# Patient Record
Sex: Female | Born: 1985 | Race: Black or African American | Hispanic: No | Marital: Single | Smoking: Current every day smoker
Health system: Southern US, Community
[De-identification: ages and names within clinical notes are randomized; demographics above are authoritative.]

## PROBLEM LIST (undated history)

## (undated) ENCOUNTER — Inpatient Hospital Stay (HOSPITAL_COMMUNITY): Payer: Self-pay

## (undated) DIAGNOSIS — F4310 Post-traumatic stress disorder, unspecified: Secondary | ICD-10-CM

## (undated) DIAGNOSIS — I10 Essential (primary) hypertension: Secondary | ICD-10-CM

## (undated) DIAGNOSIS — G43909 Migraine, unspecified, not intractable, without status migrainosus: Secondary | ICD-10-CM

## (undated) DIAGNOSIS — F319 Bipolar disorder, unspecified: Secondary | ICD-10-CM

## (undated) DIAGNOSIS — F1410 Cocaine abuse, uncomplicated: Secondary | ICD-10-CM

## (undated) DIAGNOSIS — F419 Anxiety disorder, unspecified: Secondary | ICD-10-CM

## (undated) DIAGNOSIS — N83209 Unspecified ovarian cyst, unspecified side: Secondary | ICD-10-CM

## (undated) DIAGNOSIS — O24419 Gestational diabetes mellitus in pregnancy, unspecified control: Secondary | ICD-10-CM

## (undated) HISTORY — PX: TUBAL LIGATION: SHX77

## (undated) HISTORY — PX: WISDOM TOOTH EXTRACTION: SHX21

---
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
## 2006-06-05 ENCOUNTER — Emergency Department (HOSPITAL_COMMUNITY): Admission: EM | Admit: 2006-06-05 | Discharge: 2006-06-05 | Payer: Self-pay | Admitting: Family Medicine

## 2006-07-25 ENCOUNTER — Emergency Department (HOSPITAL_COMMUNITY): Admission: EM | Admit: 2006-07-25 | Discharge: 2006-07-25 | Payer: Self-pay | Admitting: Family Medicine

## 2007-01-08 ENCOUNTER — Emergency Department (HOSPITAL_COMMUNITY): Admission: EM | Admit: 2007-01-08 | Discharge: 2007-01-08 | Payer: Self-pay | Admitting: Emergency Medicine

## 2007-07-24 ENCOUNTER — Emergency Department (HOSPITAL_COMMUNITY): Admission: EM | Admit: 2007-07-24 | Discharge: 2007-07-24 | Payer: Self-pay | Admitting: Emergency Medicine

## 2007-11-04 ENCOUNTER — Emergency Department (HOSPITAL_COMMUNITY): Admission: EM | Admit: 2007-11-04 | Discharge: 2007-11-04 | Payer: Self-pay | Admitting: Emergency Medicine

## 2008-02-04 ENCOUNTER — Inpatient Hospital Stay (HOSPITAL_COMMUNITY): Admission: RE | Admit: 2008-02-04 | Discharge: 2008-02-04 | Payer: Self-pay | Admitting: Obstetrics & Gynecology

## 2008-07-16 ENCOUNTER — Emergency Department (HOSPITAL_COMMUNITY): Admission: EM | Admit: 2008-07-16 | Discharge: 2008-07-16 | Payer: Self-pay | Admitting: Family Medicine

## 2008-08-12 ENCOUNTER — Emergency Department (HOSPITAL_COMMUNITY): Admission: EM | Admit: 2008-08-12 | Discharge: 2008-08-12 | Payer: Self-pay | Admitting: Emergency Medicine

## 2008-09-24 ENCOUNTER — Emergency Department (HOSPITAL_COMMUNITY): Admission: EM | Admit: 2008-09-24 | Discharge: 2008-09-25 | Payer: Self-pay | Admitting: Family Medicine

## 2008-09-28 ENCOUNTER — Emergency Department (HOSPITAL_COMMUNITY): Admission: EM | Admit: 2008-09-28 | Discharge: 2008-09-28 | Payer: Self-pay | Admitting: Emergency Medicine

## 2008-09-29 ENCOUNTER — Emergency Department (HOSPITAL_COMMUNITY): Admission: EM | Admit: 2008-09-29 | Discharge: 2008-09-30 | Payer: Self-pay | Admitting: Emergency Medicine

## 2008-11-25 ENCOUNTER — Emergency Department (HOSPITAL_COMMUNITY): Admission: EM | Admit: 2008-11-25 | Discharge: 2008-11-25 | Payer: Self-pay | Admitting: Family Medicine

## 2009-01-25 ENCOUNTER — Inpatient Hospital Stay (HOSPITAL_COMMUNITY): Admission: AD | Admit: 2009-01-25 | Discharge: 2009-01-25 | Payer: Self-pay | Admitting: Obstetrics & Gynecology

## 2009-01-30 ENCOUNTER — Emergency Department (HOSPITAL_COMMUNITY): Admission: EM | Admit: 2009-01-30 | Discharge: 2009-01-30 | Payer: Self-pay | Admitting: Emergency Medicine

## 2010-01-26 ENCOUNTER — Emergency Department (HOSPITAL_COMMUNITY): Admission: EM | Admit: 2010-01-26 | Discharge: 2010-01-26 | Payer: Self-pay | Admitting: Emergency Medicine

## 2011-01-18 LAB — CBC
MCV: 93.7 fL (ref 78.0–100.0)
RBC: 4.08 MIL/uL (ref 3.87–5.11)
WBC: 7.6 10*3/uL (ref 4.0–10.5)

## 2011-01-18 LAB — URINALYSIS, ROUTINE W REFLEX MICROSCOPIC
Glucose, UA: NEGATIVE mg/dL
Ketones, ur: NEGATIVE mg/dL
Nitrite: NEGATIVE
Protein, ur: NEGATIVE mg/dL
Urobilinogen, UA: 0.2 mg/dL (ref 0.0–1.0)

## 2011-01-18 LAB — DIFFERENTIAL
Eosinophils Absolute: 0.1 10*3/uL (ref 0.0–0.7)
Lymphs Abs: 1.7 10*3/uL (ref 0.7–4.0)
Monocytes Relative: 7 % (ref 3–12)
Neutrophils Relative %: 68 % (ref 43–77)

## 2011-01-18 LAB — RAPID STREP SCREEN (MED CTR MEBANE ONLY): Streptococcus, Group A Screen (Direct): NEGATIVE

## 2011-01-18 LAB — WET PREP, GENITAL: Trich, Wet Prep: NONE SEEN

## 2011-02-23 ENCOUNTER — Emergency Department (HOSPITAL_COMMUNITY)
Admission: EM | Admit: 2011-02-23 | Discharge: 2011-02-24 | Disposition: A | Discharge status: Home / Self Care | Payer: No Typology Code available for payment source | Attending: Emergency Medicine | Admitting: Emergency Medicine

## 2011-02-23 DIAGNOSIS — M542 Cervicalgia: Secondary | ICD-10-CM | POA: Insufficient documentation

## 2011-02-23 DIAGNOSIS — Y92410 Unspecified street and highway as the place of occurrence of the external cause: Secondary | ICD-10-CM | POA: Insufficient documentation

## 2011-02-23 DIAGNOSIS — M62838 Other muscle spasm: Secondary | ICD-10-CM | POA: Insufficient documentation
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
## 2011-02-24 NOTE — Discharge Summary (Signed)
[facility]  Patient:    Olivia [name], Olivia [name] Visit Number: [id number] MRN: [id number]          Service Type: PSY Location: 100 0104 02 Attending Physician:  Veneta Penton Dictated by:   Veneta Penton, M.D. Admit Date:  03/31/2002 Discharge Date: 04/04/2002                             Discharge Summary  REASON FOR ADMISSION:  This 25 year old African-Americanan female was admitted complaining of depression with suicidal ideation with a plan she refused to discuss.  She was admitted under involuntary commitment.  For further history of present illness, please see the patients psychiatric admission assessment.  PHYSICAL EXAMINATION:  At the time of admission was entirely unremarkable.  LABORATORY EXAMINATION:  The patient underwent a laboratory work-up to rule out any other medical problems contributing to her symptomatology.  A urine probe for gonorrhea and chlamydia were negative.  CBC showed a platelet count of 349,000 and was otherwise unremarkable.  Routine chem panel was within normal limits.  TSH was 0.493.  Free T4 was within normal limits.  A urine pregnancy test was negative.  A urine drug screen was positive for metabolites of marijuana and consistent with the patients history.  A UA showed a positive nitrate, a moderate leukocyte esterase, a few epithelial cells, 7-10 WBCs and 3-6 RBCs with a high amount of hemoglobin consistent with the patient being on her menstrual period.  She denied any urinary tract symptoms.  An RPR was nonreactive.  The patient received no x-rays, no special procedures, no additional consultations.  She sustained no complications during the course of this hospitalization.  HOSPITAL COURSE:  On admission, the patients affect and mood were depressed and anxious.  Her concentration was decreased.  She displayed poor impulse control.  She was begun on a trial of Lexapro once informed consent was obtained and  the risks/benefits discussion was held.  Her affect and mood over the course of this hospitalization has improved and at the time of discharge she denies any homicidal or suicidal ideation.  She is actively participating in all aspects of the therapeutic treatment program, no longer appears to be a danger to herself or others, and is motivated for outpatient therapy, and consequently is felt to have reached her maximum benefits of hospitalization and is ready for discharge to a less restricted alternative setting.  CONDITION ON DISCHARGE:  Improved  FINAL DIAGNOSIS: Axis I:    1. Major depression, single episode, severe, without psychosis.            2. Polysubstance dependence.            3. Conduct disorder.            4. Rule out cocaine-induced depression. Axis II:   1. Rule out learning disorder not otherwise specified.            2. Rule out personality disorder not otherwise specified. Axis III:  None. Axis IV:   Severe. Axis V:    Code 20 on admission, code 30 on discharge.  FURTHER EVALUATION AND TREATMENT RECOMMENDATIONS: 1. The patient is discharged to home. 2. She is discharged on an unrestricted level of activity and a regular diet. 3. She will follow up with [facility] and her    outpatient psychiatrist there for all further aspects of her psychiatric    care and I  will sign off on the case at this time.  She will follow up with    her primary care physician for all further aspects of her medical care.  DISCHARGE MEDICATIONS:  Lexapro 10 mg each day. Dictated by:   Veneta Penton, M.D. Attending Physician:  Veneta Penton DD:  04/04/02 TD:  04/05/02 Job: [id number]

## 2011-02-28 ENCOUNTER — Other Ambulatory Visit (HOSPITAL_COMMUNITY): Payer: Self-pay | Admitting: Orthopaedic Surgery

## 2011-02-28 DIAGNOSIS — M542 Cervicalgia: Secondary | ICD-10-CM

## 2011-03-03 ENCOUNTER — Ambulatory Visit (HOSPITAL_COMMUNITY)
Admission: RE | Admit: 2011-03-03 | Discharge: 2011-03-03 | Disposition: A | Discharge status: Home / Self Care | Payer: No Typology Code available for payment source | Source: Ambulatory Visit | Attending: Orthopaedic Surgery | Admitting: Orthopaedic Surgery

## 2011-03-03 DIAGNOSIS — R209 Unspecified disturbances of skin sensation: Secondary | ICD-10-CM | POA: Insufficient documentation

## 2011-03-03 DIAGNOSIS — M542 Cervicalgia: Secondary | ICD-10-CM | POA: Insufficient documentation

## 2011-06-29 LAB — URINE MICROSCOPIC-ADD ON

## 2011-06-29 LAB — BASIC METABOLIC PANEL
GFR calc non Af Amer: >60
Potassium: 4
Sodium: 136

## 2011-06-29 LAB — URINALYSIS, ROUTINE W REFLEX MICROSCOPIC
Bilirubin Urine: NEGATIVE
Nitrite: NEGATIVE
Specific Gravity, Urine: 1.014
pH: 7

## 2011-06-29 LAB — CBC
HCT: 37.2
Hemoglobin: 12.8
RBC: 4.13
WBC: 6.4

## 2011-06-29 LAB — DIFFERENTIAL
Eosinophils Relative: 1
Lymphocytes Relative: 31
Lymphs Abs: 2
Monocytes Absolute: 0.4

## 2011-06-29 LAB — WET PREP, GENITAL
Trich, Wet Prep: NONE SEEN
Yeast Wet Prep HPF POC: NONE SEEN

## 2011-06-29 LAB — RPR: RPR Ser Ql: NONREACTIVE

## 2011-06-29 LAB — GC/CHLAMYDIA PROBE AMP, GENITAL: Chlamydia, DNA Probe: NEGATIVE

## 2011-07-04 LAB — URINALYSIS, ROUTINE W REFLEX MICROSCOPIC
Bilirubin Urine: NEGATIVE
Glucose, UA: NEGATIVE
Hgb urine dipstick: NEGATIVE
Ketones, ur: NEGATIVE
Nitrite: NEGATIVE
Protein, ur: NEGATIVE
Specific Gravity, Urine: <1.005 — ABNORMAL LOW
Urobilinogen, UA: 0.2
pH: 5.5

## 2011-07-04 LAB — GC/CHLAMYDIA PROBE AMP, GENITAL: Chlamydia, DNA Probe: NEGATIVE

## 2011-07-04 LAB — POCT PREGNANCY, URINE
Operator id: 120561
Preg Test, Ur: NEGATIVE

## 2011-07-04 LAB — WET PREP, GENITAL
Clue Cells Wet Prep HPF POC: NONE SEEN
Trich, Wet Prep: NONE SEEN
Yeast Wet Prep HPF POC: NONE SEEN

## 2011-07-19 LAB — POCT PREGNANCY, URINE
Operator id: 239701
Preg Test, Ur: NEGATIVE

## 2011-07-19 LAB — POCT URINALYSIS DIP (DEVICE)
Glucose, UA: NEGATIVE
Nitrite: NEGATIVE
Operator id: 239701
Protein, ur: 30 — AB
Specific Gravity, Urine: 1.02
Urobilinogen, UA: 0.2

## 2011-07-23 ENCOUNTER — Emergency Department (HOSPITAL_COMMUNITY)
Admission: EM | Admit: 2011-07-23 | Discharge: 2011-07-23 | Disposition: A | Discharge status: Home / Self Care | Payer: No Typology Code available for payment source | Attending: Emergency Medicine | Admitting: Emergency Medicine

## 2011-07-23 DIAGNOSIS — Y92410 Unspecified street and highway as the place of occurrence of the external cause: Secondary | ICD-10-CM | POA: Insufficient documentation

## 2011-07-23 DIAGNOSIS — M542 Cervicalgia: Secondary | ICD-10-CM | POA: Insufficient documentation

## 2011-07-23 DIAGNOSIS — T1490XA Injury, unspecified, initial encounter: Secondary | ICD-10-CM | POA: Insufficient documentation

## 2011-07-23 DIAGNOSIS — M25519 Pain in unspecified shoulder: Secondary | ICD-10-CM | POA: Insufficient documentation

## 2011-07-29 ENCOUNTER — Inpatient Hospital Stay (INDEPENDENT_AMBULATORY_CARE_PROVIDER_SITE_OTHER)
Admission: RE | Admit: 2011-07-29 | Discharge: 2011-07-29 | Disposition: A | Discharge status: Home / Self Care | Payer: Self-pay | Source: Ambulatory Visit | Attending: Emergency Medicine | Admitting: Emergency Medicine

## 2011-07-29 ENCOUNTER — Ambulatory Visit (INDEPENDENT_AMBULATORY_CARE_PROVIDER_SITE_OTHER): Payer: No Typology Code available for payment source

## 2011-07-29 DIAGNOSIS — IMO0002 Reserved for concepts with insufficient information to code with codable children: Secondary | ICD-10-CM

## 2011-07-29 DIAGNOSIS — S139XXA Sprain of joints and ligaments of unspecified parts of neck, initial encounter: Secondary | ICD-10-CM

## 2011-10-26 ENCOUNTER — Emergency Department (INDEPENDENT_AMBULATORY_CARE_PROVIDER_SITE_OTHER)
Admission: EM | Admit: 2011-10-26 | Discharge: 2011-10-26 | Disposition: A | Discharge status: Home / Self Care | Payer: Self-pay | Source: Home / Self Care | Attending: Family Medicine | Admitting: Family Medicine

## 2011-10-26 ENCOUNTER — Encounter (HOSPITAL_COMMUNITY): Payer: Self-pay | Admitting: Emergency Medicine

## 2011-10-26 DIAGNOSIS — T148XXA Other injury of unspecified body region, initial encounter: Secondary | ICD-10-CM

## 2011-10-26 DIAGNOSIS — R51 Headache: Secondary | ICD-10-CM

## 2011-10-26 MED ORDER — CYCLOBENZAPRINE HCL 10 MG PO TABS: 10.0000 mg | ORAL_TABLET | Freq: Two times a day (BID) | ORAL | Status: AC | PRN

## 2011-10-26 MED ORDER — ACETAMINOPHEN-CODEINE #3 300-30 MG PO TABS: 1.0000 | ORAL_TABLET | Freq: Four times a day (QID) | ORAL | Status: AC | PRN
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
## 2012-04-13 ENCOUNTER — Emergency Department (INDEPENDENT_AMBULATORY_CARE_PROVIDER_SITE_OTHER)
Admission: EM | Admit: 2012-04-13 | Discharge: 2012-04-13 | Disposition: A | Discharge status: Home / Self Care | Payer: Self-pay | Source: Home / Self Care | Attending: Family Medicine | Admitting: Family Medicine

## 2012-04-13 ENCOUNTER — Encounter (HOSPITAL_COMMUNITY): Payer: Self-pay | Admitting: *Deleted

## 2012-04-13 DIAGNOSIS — G500 Trigeminal neuralgia: Secondary | ICD-10-CM

## 2012-04-13 HISTORY — DX: Migraine, unspecified, not intractable, without status migrainosus: G43.909

## 2012-04-13 MED ORDER — NEOMYCIN-POLYMYXIN-HC 3.5-10000-1 OT SUSP: 4.0000 [drp] | Freq: Three times a day (TID) | OTIC | Status: AC

## 2012-04-13 MED ORDER — GABAPENTIN 300 MG PO CAPS: 300.0000 mg | ORAL_CAPSULE | Freq: Three times a day (TID) | ORAL | Status: DC

## 2012-04-13 NOTE — ED Provider Notes (Signed)
History     CSN: [id number]  Arrival date & time 04/13/12  1102   First MD Initiated Contact with Patient 04/13/12 1103      Chief Complaint  Patient presents with  . Otalgia    (Consider location/radiation/quality/duration/timing/severity/associated sxs/prior treatment) Patient is a 26 y.o. female presenting with ear pain. The history is provided by the patient.  Otalgia This is a new problem. The current episode started more than 1 week ago (2 weeks of continuous sx.). There is pain in the left ear. The problem has not changed since onset.There has been no fever. The pain is mild. Associated symptoms include headaches. Pertinent negatives include no ear discharge, no hearing loss, no rhinorrhea and no sore throat. Her past medical history does not include chronic ear infection.    Past Medical History  Diagnosis Date  . Migraine     History reviewed. No pertinent past surgical history.  History reviewed. No pertinent family history.  History  Substance Use Topics  . Smoking status: Current Everyday Smoker  . Smokeless tobacco: Not on file  . Alcohol Use: No    OB History    Grav Para Term Preterm Abortions TAB SAB Ect Mult Living                  Review of Systems  Constitutional: Negative.   HENT: Positive for ear pain. Negative for hearing loss, sore throat, rhinorrhea and ear discharge.   Eyes: Negative.   Neurological: Positive for headaches.    Allergies  Review of patient's allergies indicates no known allergies.  Home Medications   Current Outpatient Rx  Name Route Sig Dispense Refill  . IBUPROFEN 400 MG PO TABS Oral Take 400 mg by mouth every 6 (six) hours as needed.    Marland Kitchen GABAPENTIN 300 MG PO CAPS Oral Take 1 capsule (300 mg total) by mouth 3 (three) times daily. 30 capsule 1  . NEOMYCIN-POLYMYXIN-HC 3.5-10000-1 OT SUSP Left Ear Place 4 drops into the left ear 3 (three) times daily. 10 mL 0    BP 120/82  Pulse 66  Temp 99.5 F (37.5 C) (Oral)   Resp 16  SpO2 100%  LMP 03/29/2012  Physical Exam  Nursing note and vitals reviewed. Constitutional: She is oriented to person, place, and time. She appears well-developed and well-nourished.  HENT:  Head: Atraumatic.  Right Ear: External ear normal.  Left Ear: External ear normal.  Nose: Nose normal.  Mouth/Throat: Oropharynx is clear and moist.  Eyes: Conjunctivae are normal. Pupils are equal, round, and reactive to light.  Neck: Normal range of motion. Neck supple.  Lymphadenopathy:    She has no cervical adenopathy.  Neurological: She is alert and oriented to person, place, and time.       Left facial preauricular tenderness with facial n distribution paresthesias. No apparent weakness.,nl speech.  Skin: Skin is warm and dry.    ED Course  Procedures (including critical care time)  Labs Reviewed - No data to display No results found.   1. Trigeminal neuralgia pain       MDM          Linna Hoff, MD 04/13/12 1134

## 2012-04-13 NOTE — ED Notes (Signed)
Pt c/o left ear pain/radiating to left side of face onset x 2 weeks

## 2012-04-18 ENCOUNTER — Telehealth (HOSPITAL_COMMUNITY): Payer: Self-pay | Admitting: *Deleted

## 2012-04-18 NOTE — ED Notes (Signed)
Pt. called on VM  on nurse line at the clinical desk.  I called back and she said the pharmacy told her she did not have a valid Rx.  I told her I did not understand why the pharmacy did not call us.  I asked her if she had any purple papers with her d/c instructions. She said no. I told her I would ask the doctor and then call them in for her. Discussed with Dr. Alfonse Ras- Coll and she gave permission for me to call in the Gabapentin and Neomycin- Polymyxin- Hydrocortisone ear drops.  Rx.'s called the pharmacist @ [phone]. Vassie Moselle 04/18/2012

## 2012-07-11 ENCOUNTER — Inpatient Hospital Stay (HOSPITAL_COMMUNITY)
Admission: AD | Admit: 2012-07-11 | Discharge: 2012-07-11 | Disposition: A | Discharge status: Home / Self Care | Payer: Self-pay | Source: Ambulatory Visit | Attending: Obstetrics & Gynecology | Admitting: Obstetrics & Gynecology

## 2012-07-11 ENCOUNTER — Encounter (HOSPITAL_COMMUNITY): Payer: Self-pay | Admitting: *Deleted

## 2012-07-11 ENCOUNTER — Inpatient Hospital Stay (HOSPITAL_COMMUNITY): Payer: Self-pay

## 2012-07-11 DIAGNOSIS — N946 Dysmenorrhea, unspecified: Secondary | ICD-10-CM

## 2012-07-11 DIAGNOSIS — R51 Headache: Secondary | ICD-10-CM | POA: Insufficient documentation

## 2012-07-11 DIAGNOSIS — M549 Dorsalgia, unspecified: Secondary | ICD-10-CM | POA: Insufficient documentation

## 2012-07-11 DIAGNOSIS — N938 Other specified abnormal uterine and vaginal bleeding: Secondary | ICD-10-CM | POA: Insufficient documentation

## 2012-07-11 DIAGNOSIS — B9689 Other specified bacterial agents as the cause of diseases classified elsewhere: Secondary | ICD-10-CM | POA: Insufficient documentation

## 2012-07-11 DIAGNOSIS — N940 Mittelschmerz: Secondary | ICD-10-CM

## 2012-07-11 DIAGNOSIS — N949 Unspecified condition associated with female genital organs and menstrual cycle: Secondary | ICD-10-CM | POA: Insufficient documentation

## 2012-07-11 DIAGNOSIS — A499 Bacterial infection, unspecified: Secondary | ICD-10-CM | POA: Insufficient documentation

## 2012-07-11 DIAGNOSIS — R1032 Left lower quadrant pain: Secondary | ICD-10-CM | POA: Insufficient documentation

## 2012-07-11 DIAGNOSIS — N760 Acute vaginitis: Secondary | ICD-10-CM | POA: Insufficient documentation

## 2012-07-11 LAB — URINALYSIS, ROUTINE W REFLEX MICROSCOPIC
Glucose, UA: NEGATIVE mg/dL
Ketones, ur: NEGATIVE mg/dL
Leukocytes, UA: NEGATIVE
Protein, ur: NEGATIVE mg/dL
Urobilinogen, UA: 0.2 mg/dL (ref 0.0–1.0)

## 2012-07-11 LAB — POCT PREGNANCY, URINE: Preg Test, Ur: NEGATIVE

## 2012-07-11 LAB — WET PREP, GENITAL: Trich, Wet Prep: NONE SEEN

## 2012-07-11 LAB — URINE MICROSCOPIC-ADD ON

## 2012-07-11 MED ORDER — IBUPROFEN 800 MG PO TABS: 800.0000 mg | ORAL_TABLET | Freq: Three times a day (TID) | ORAL | Status: DC | PRN

## 2012-07-11 MED ORDER — KETOROLAC TROMETHAMINE 60 MG/2ML IM SOLN
60.0000 mg | Freq: Once | INTRAMUSCULAR | Status: AC
  Administered 2012-07-11: 60 mg via INTRAMUSCULAR
  Filled 2012-07-11: qty 2

## 2012-07-11 MED ORDER — METRONIDAZOLE 500 MG PO TABS: 500.0000 mg | ORAL_TABLET | Freq: Two times a day (BID) | ORAL | Status: DC

## 2012-07-11 NOTE — MAU Note (Signed)
Patient states she had been having left abdominal pain for 4 days and headaches for 3 days. Bloody vaginal discharge with itching.

## 2012-07-11 NOTE — MAU Provider Note (Signed)
History     CSN: [id number]  Arrival date and time: 07/11/12 1203   First Provider Initiated Contact with Patient 07/11/12 1432      Chief Complaint  Patient presents with  . Headache  . Abdominal Pain   HPI Pt is a 26 yo female who reports headache, back pain, vaginal spotting, vaginal itching and abdominal pain this week.  Clear vaginal discharge with brown specks starting 3 days ago. Headache and low back pain started 2 days ago. Yesterday she started having light vaginal spotting and itching as well as LLQ abdominal pain that is achy/crampy in nature.  Lying on her back or pushing hard on her left side makes pain worse. She took ibuprofen but pain still present. Her LMP was 9/7 and lasted 3 days. She has regular periods that are usually 3-4 days, no heavy bleeding. She uses condoms for birth control and has not had any STDs. She had a normal pap smear in August. History or ovarian cysts 5 years ago.  Headache is frontal, constant. No photophobia or vision changes. Does have nausea, no vomitng. No fever, chills, diarrhea or constipation.  She goes to [facility] on [address] for care.   Past Medical History  Diagnosis Date  . Migraine     Past Surgical History  Procedure Date  . Wisdom tooth extraction     History reviewed. No pertinent family history.  History  Substance Use Topics  . Smoking status: Current Every Day Smoker    Types: Cigarettes  . Smokeless tobacco: Not on file  . Alcohol Use: No  Smokes 3 packs per week.   Allergies: No Known Allergies  Prescriptions prior to admission  Medication Sig Dispense Refill  . gabapentin (NEURONTIN) 300 MG capsule Take 1 capsule (300 mg total) by mouth 3 (three) times daily.  30 capsule  1  . ibuprofen (ADVIL,MOTRIN) 400 MG tablet Take 400 mg by mouth every 6 (six) hours as needed.       Not taking gabapentin  Review of Systems  Constitutional: Negative for fever and chills.  HENT: Negative for hearing loss and  tinnitus.   Eyes: Positive for blurred vision. Negative for double vision and photophobia.  Respiratory: Negative for shortness of breath.   Cardiovascular: Negative for chest pain and palpitations.  Gastrointestinal: Positive for nausea and abdominal pain. Negative for heartburn, vomiting, diarrhea, constipation, blood in stool and melena.  Genitourinary: Negative for dysuria, urgency and frequency.  Musculoskeletal: Positive for back pain.  Skin: Negative for rash.  Neurological: Positive for headaches. Negative for dizziness.   Physical Exam   Blood pressure 121/75, pulse 79, temperature 98.1 F (36.7 C), temperature source Oral, resp. rate 16, height 5' 1.5" (1.562 m), weight 67.223 kg (148 lb 3.2 oz), last menstrual period 07/05/2012, SpO2 100.00%.  Physical Exam  Constitutional: She appears well-developed and well-nourished. No distress.  HENT:  Head: Normocephalic and atraumatic.  Eyes: Conjunctivae normal and EOM are normal.  Neck: Normal range of motion. Neck supple.  Cardiovascular: Normal rate, regular rhythm and normal heart sounds.   Respiratory: Effort normal and breath sounds normal. No respiratory distress.  GI: Soft. Bowel sounds are normal. She exhibits no distension. There is tenderness (mild LLQ). There is no rebound and no guarding.  Genitourinary:       Normal vagina, moderate white discharge. No odor. Uterus normal size. Cervix deviated slightly to left, nulliparous. No CMT. Mild left adnexal tenderness, no masses.   Musculoskeletal: Normal range of motion.  She exhibits no edema and no tenderness.  Neurological: She is alert.  Skin: Skin is warm and dry.  Psychiatric: She has a normal mood and affect.    MAU Course  Procedures   Results for orders placed during the hospital encounter of 07/11/12 (from the past 24 hour(s))  URINALYSIS, ROUTINE W REFLEX MICROSCOPIC     Status: Abnormal   Collection Time   07/11/12 12:40 PM      Component Value Range    Color, Urine YELLOW  YELLOW   APPearance CLEAR  CLEAR   Specific Gravity, Urine 1.010  1.005 - 1.030   pH 6.5  5.0 - 8.0   Glucose, UA NEGATIVE  NEGATIVE mg/dL   Hgb urine dipstick MODERATE (*) NEGATIVE   Bilirubin Urine NEGATIVE  NEGATIVE   Ketones, ur NEGATIVE  NEGATIVE mg/dL   Protein, ur NEGATIVE  NEGATIVE mg/dL   Urobilinogen, UA 0.2  0.0 - 1.0 mg/dL   Nitrite NEGATIVE  NEGATIVE   Leukocytes, UA NEGATIVE  NEGATIVE  URINE MICROSCOPIC-ADD ON     Status: Normal   Collection Time   07/11/12 12:40 PM      Component Value Range   Squamous Epithelial / LPF RARE  RARE   WBC, UA 0-2  <3 WBC/hpf   RBC / HPF 0-2  <3 RBC/hpf   Bacteria, UA RARE  RARE  POCT PREGNANCY, URINE     Status: Normal   Collection Time   07/11/12 12:46 PM      Component Value Range   Preg Test, Ur NEGATIVE  NEGATIVE  WET PREP, GENITAL     Status: Abnormal   Collection Time   07/11/12  2:50 PM      Component Value Range   Yeast Wet Prep HPF POC NONE SEEN  NONE SEEN   Trich, Wet Prep NONE SEEN  NONE SEEN   Clue Cells Wet Prep HPF POC FEW (*) NONE SEEN   WBC, Wet Prep HPF POC FEW (*) NONE SEEN   Pelvic Sono: Unremarkable. Small amt of free fluid in cul de sac and R adnexa.   Assessment and Plan  26 y.o. G0P0 with  1. Left-sided pelvic pain - no cyst. Likely onset of menses.  Motrin for pain. 2.  Bacterial vaginosis - treat with flagyl 3.  Vaginal bleeding. Likely onset of menses. Pt advised to monitor cycles and follow up with her ob/gyn as needed. UPT negative.    Napoleon Form 07/11/2012, 2:33 PM

## 2014-05-16 ENCOUNTER — Encounter (HOSPITAL_COMMUNITY): Payer: Self-pay | Admitting: Emergency Medicine

## 2014-05-16 ENCOUNTER — Emergency Department (HOSPITAL_COMMUNITY)
Admission: EM | Admit: 2014-05-16 | Discharge: 2014-05-16 | Disposition: A | Discharge status: Home / Self Care | Payer: Worker's Compensation | Attending: Emergency Medicine | Admitting: Emergency Medicine

## 2014-05-16 DIAGNOSIS — T2220XA Burn of second degree of shoulder and upper limb, except wrist and hand, unspecified site, initial encounter: Secondary | ICD-10-CM | POA: Diagnosis present

## 2014-05-16 DIAGNOSIS — X12XXXA Contact with other hot fluids, initial encounter: Secondary | ICD-10-CM | POA: Insufficient documentation

## 2014-05-16 DIAGNOSIS — F17200 Nicotine dependence, unspecified, uncomplicated: Secondary | ICD-10-CM | POA: Insufficient documentation

## 2014-05-16 DIAGNOSIS — X131XXA Other contact with steam and other hot vapors, initial encounter: Secondary | ICD-10-CM

## 2014-05-16 DIAGNOSIS — Y9289 Other specified places as the place of occurrence of the external cause: Secondary | ICD-10-CM | POA: Diagnosis not present

## 2014-05-16 DIAGNOSIS — Y9389 Activity, other specified: Secondary | ICD-10-CM | POA: Insufficient documentation

## 2014-05-16 DIAGNOSIS — Z8669 Personal history of other diseases of the nervous system and sense organs: Secondary | ICD-10-CM | POA: Insufficient documentation

## 2014-05-16 DIAGNOSIS — Z792 Long term (current) use of antibiotics: Secondary | ICD-10-CM | POA: Diagnosis not present

## 2014-05-16 MED ORDER — SILVER SULFADIAZINE 1 % EX CREA
TOPICAL_CREAM | Freq: Once | CUTANEOUS | Status: AC
  Administered 2014-05-16: 12:00:00 via TOPICAL
  Filled 2014-05-16: qty 50

## 2014-05-16 MED ORDER — HYDROCODONE-ACETAMINOPHEN 5-325 MG PO TABS
2.0000 | ORAL_TABLET | Freq: Once | ORAL | Status: AC
  Administered 2014-05-16: 2 via ORAL
  Filled 2014-05-16: qty 2

## 2014-05-16 NOTE — ED Provider Notes (Signed)
Medical screening examination/treatment/procedure(s) were performed by non-physician practitioner and as supervising physician I was immediately available for consultation/collaboration.   EKG Interpretation None       Raeford RazorStephen Casper Pagliuca, MD 05/16/14 1504
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
## 2014-05-16 NOTE — Discharge Instructions (Signed)
Continue using silvadene cream and dressing changes twice daily.  Tylenol or motrin as needed for pain. Monitor for signs of infection-- redness, swelling, purulent drainage, high fever, chills, etc. Return to the ED for new or worsening symptoms.
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
## 2014-08-27 ENCOUNTER — Emergency Department (HOSPITAL_COMMUNITY)
Admission: EM | Admit: 2014-08-27 | Discharge: 2014-08-27 | Disposition: A | Discharge status: Home / Self Care | Payer: Self-pay | Attending: Emergency Medicine | Admitting: Emergency Medicine

## 2014-08-27 ENCOUNTER — Encounter (HOSPITAL_COMMUNITY): Payer: Self-pay | Admitting: Emergency Medicine

## 2014-08-27 ENCOUNTER — Emergency Department (HOSPITAL_COMMUNITY): Payer: Self-pay

## 2014-08-27 DIAGNOSIS — Z8679 Personal history of other diseases of the circulatory system: Secondary | ICD-10-CM | POA: Insufficient documentation

## 2014-08-27 DIAGNOSIS — N83201 Unspecified ovarian cyst, right side: Secondary | ICD-10-CM

## 2014-08-27 DIAGNOSIS — N8320 Unspecified ovarian cysts: Secondary | ICD-10-CM | POA: Insufficient documentation

## 2014-08-27 DIAGNOSIS — Z720 Tobacco use: Secondary | ICD-10-CM | POA: Insufficient documentation

## 2014-08-27 DIAGNOSIS — Z3202 Encounter for pregnancy test, result negative: Secondary | ICD-10-CM | POA: Insufficient documentation

## 2014-08-27 DIAGNOSIS — R102 Pelvic and perineal pain: Secondary | ICD-10-CM

## 2014-08-27 DIAGNOSIS — N938 Other specified abnormal uterine and vaginal bleeding: Secondary | ICD-10-CM | POA: Insufficient documentation

## 2014-08-27 LAB — CBC WITH DIFFERENTIAL/PLATELET
BASOS ABS: 0.1 10*3/uL (ref 0.0–0.1)
BASOS PCT: 1 % (ref 0–1)
Eosinophils Absolute: 0.1 10*3/uL (ref 0.0–0.7)
Eosinophils Relative: 2 % (ref 0–5)
HEMATOCRIT: 39.3 % (ref 36.0–46.0)
Hemoglobin: 13.2 g/dL (ref 12.0–15.0)
LYMPHS PCT: 38 % (ref 12–46)
Lymphs Abs: 2.4 10*3/uL (ref 0.7–4.0)
MCH: 30 pg (ref 26.0–34.0)
MCHC: 33.6 g/dL (ref 30.0–36.0)
MCV: 89.3 fL (ref 78.0–100.0)
Monocytes Absolute: 0.4 10*3/uL (ref 0.1–1.0)
Monocytes Relative: 7 % (ref 3–12)
NEUTROS ABS: 3.3 10*3/uL (ref 1.7–7.7)
NEUTROS PCT: 52 % (ref 43–77)
PLATELETS: 353 10*3/uL (ref 150–400)
RBC: 4.4 MIL/uL (ref 3.87–5.11)
RDW: 12.3 % (ref 11.5–15.5)
WBC: 6.3 10*3/uL (ref 4.0–10.5)

## 2014-08-27 LAB — URINALYSIS, ROUTINE W REFLEX MICROSCOPIC
Glucose, UA: NEGATIVE mg/dL
Ketones, ur: NEGATIVE mg/dL
Nitrite: NEGATIVE
PROTEIN: 30 mg/dL — AB
SPECIFIC GRAVITY, URINE: 1.03 (ref 1.005–1.030)
UROBILINOGEN UA: 1 mg/dL (ref 0.0–1.0)
pH: 6 (ref 5.0–8.0)

## 2014-08-27 LAB — POC URINE PREG, ED: PREG TEST UR: NEGATIVE

## 2014-08-27 LAB — URINE MICROSCOPIC-ADD ON

## 2014-08-27 MED ORDER — NAPROXEN 500 MG PO TABS: 500.0000 mg | ORAL_TABLET | Freq: Two times a day (BID) | ORAL | Status: DC

## 2014-08-27 NOTE — ED Notes (Addendum)
Pt co vaginal bleeding since Friday. Last period was on 08/09/14. HX of ovarian cysts. Pt co abdominal pain, weakness  and heavy vaginal bleeding today. 2 pads/ hour.

## 2014-08-27 NOTE — Progress Notes (Signed)
[facility] Stacy,   Provided pt with a list of primary care resources, highlighting [facility]. Also, Provided pt with a [facility] application to help patient establish primary care.

## 2014-08-27 NOTE — ED Provider Notes (Signed)
CSN: [id number]     Arrival date & time 08/27/14  0932 History   First MD Initiated Contact with Patient 08/27/14 [phone]     Chief Complaint  Patient presents with  . Vaginal Bleeding     (Consider location/radiation/quality/duration/timing/severity/associated sxs/prior Treatment) HPI Comments: Pt had normal menses on the first of the month that lasted 5 days and about 1 week later she started to spot and then started to have heavy bleeding in the last 2-3 days.  Patient is a 28 y.o. female presenting with vaginal bleeding. The history is provided by the patient.  Vaginal Bleeding Quality:  Dark red and heavier than menses Severity:  Moderate Onset quality:  Gradual Duration:  3 days Timing:  Constant Progression:  Worsening Chronicity:  New Menstrual history:  Regular Number of pads used:  2/hr Possible pregnancy: no   Context: spontaneously   Relieved by:  None tried Worsened by:  Nothing tried Ineffective treatments:  None tried Associated symptoms: abdominal pain and fatigue   Associated symptoms: no back pain, no dizziness, no dysuria, no fever, no nausea and no vaginal discharge   Abdominal pain:    Pain location: right pelvis.   Quality:  Cramping and sharp   Severity:  Moderate   Onset quality:  Gradual   Duration:  3 days   Timing:  Constant   Progression:  Worsening   Chronicity:  Recurrent Risk factors: ovarian cysts and unprotected sex   Risk factors: does not have multiple partners, no new sexual partner, no STD and no STD exposure     Past Medical History  Diagnosis Date  . Migraine    Past Surgical History  Procedure Laterality Date  . Wisdom tooth extraction     Family History  Problem Relation Age of Onset  . Hypertension Mother   . Hypertension Father   . Migraines Mother   . Cancer Father     prostate   History  Substance Use Topics  . Smoking status: Current Every Day Smoker    Types: Cigarettes  . Smokeless tobacco: Not on file  .  Alcohol Use: No   OB History    Gravida Para Term Preterm AB TAB SAB Ectopic Multiple Living   0              Review of Systems  Constitutional: Positive for fatigue. Negative for fever.  Gastrointestinal: Positive for abdominal pain. Negative for nausea.  Genitourinary: Positive for vaginal bleeding. Negative for dysuria and vaginal discharge.  Musculoskeletal: Negative for back pain.  Neurological: Negative for dizziness.  All other systems reviewed and are negative.     Allergies  Review of patient's allergies indicates no known allergies.  Home Medications   Prior to Admission medications   Medication Sig Start Date End Date Taking? Authorizing Provider  naproxen sodium (ANAPROX) 220 MG tablet Take 440 mg by mouth 2 (two) times daily as needed (pain).    Yes Historical Provider, MD   BP 123/83 mmHg  Pulse 80  Temp(Src) 98.2 F (36.8 C) (Oral)  Resp 16  SpO2 100%  LMP 08/09/2014 Physical Exam  Constitutional: She is oriented to person, place, and time. She appears well-developed and well-nourished. No distress.  HENT:  Head: Normocephalic and atraumatic.  Mouth/Throat: Oropharynx is clear and moist.  Eyes: Conjunctivae and EOM are normal. Pupils are equal, round, and reactive to light.  Neck: Normal range of motion. Neck supple.  Cardiovascular: Normal rate, regular rhythm and intact distal pulses.  Abdominal: Soft. She exhibits no distension. There is tenderness. There is no rebound and no guarding.  Mild right sided pelvic tenderness  Genitourinary: Uterus normal. Uterus is not tender. Cervix exhibits no motion tenderness, no discharge and no friability. Right adnexum displays tenderness. Right adnexum displays no mass and no fullness. Left adnexum displays no mass, no tenderness and no fullness. There is bleeding in the vagina.  Musculoskeletal: Normal range of motion. She exhibits no edema or tenderness.  Neurological: She is alert and oriented to person, place,  and time.  Skin: Skin is warm and dry. No rash noted. No erythema.  Psychiatric: She has a normal mood and affect. Her behavior is normal.  Nursing note and vitals reviewed.   ED Course  Procedures (including critical care time) Labs Review Labs Reviewed  GC/CHLAMYDIA PROBE AMP  CBC WITH DIFFERENTIAL  URINALYSIS, ROUTINE W REFLEX MICROSCOPIC  POC URINE PREG, ED    Imaging Review [place] Transvaginal Non-ob  08/27/2014   CLINICAL DATA:  Abdominal pain adnexal pain and pelvic pain. Abnormal mentral period two week weeks prior.  EXAM: TRANSABDOMINAL AND TRANSVAGINAL ULTRASOUND OF PELVIS  TECHNIQUE: Both transabdominal and transvaginal ultrasound examinations of the pelvis were performed. Transabdominal technique was performed for global imaging of the pelvis including uterus, ovaries, adnexal regions, and pelvic cul-de-sac. It was necessary to proceed with endovaginal exam following the transabdominal exam to visualize the endometrium and ovaries.  COMPARISON:  Ultrasound 07/11/2012  FINDINGS: Uterus  Measurements: Normal in size and homogeneous in echotexture measuring 7.7 x 3.1 x 4.8 cm. Several nabothian cysts in lower uterine segment.  Endometrium  Thickness: Normal in thickness for premenopausal female at 10 mm. No nodularity.  Right ovary  Measurements: Normal in size at 3.9 x 2.5 x 2.6 cm. There is a rounded complex cysts within the right ovary measuring 2.5 cm with a peripheral nodularity but no significant blood flow. Nodules measure up to 7 mm in there are thickened septations.  Left ovary  Measurements: Normal in size at 3.6 x 1.6 x 3.7 cm. Smaller complex cysts within the left ovary measures 2.1 x 1.4 x 1 per 8 cm and most consistent with a small hemorrhagic cyst or corpus luteal cyst.  Other findings  Small amount free fluid.  IMPRESSION: 1. Complex cysts in the right ovary with mural nodularity without blood flow. While this may represent a complex hemorrhagic cyst, cannot exclude a ovarian  neoplasm. Recommend followup pelvic ultrasound in 6 to 12 weeks and if findings persist recommend GYN consultation. 2. Benign-appearing right hemorrhagic or corpus luteal cyst of the left ovary. 3. Uterus and endometrium appear normal. Nabothian cysts in the lower uterine segment.   Electronically Signed   By: Genevive BiStewart  Edmunds M.D.   On: 08/27/2014 12:07   [place] Pelvis Complete  08/27/2014   CLINICAL DATA:  Abdominal pain adnexal pain and pelvic pain. Abnormal mentral period two week weeks prior.  EXAM: TRANSABDOMINAL AND TRANSVAGINAL ULTRASOUND OF PELVIS  TECHNIQUE: Both transabdominal and transvaginal ultrasound examinations of the pelvis were performed. Transabdominal technique was performed for global imaging of the pelvis including uterus, ovaries, adnexal regions, and pelvic cul-de-sac. It was necessary to proceed with endovaginal exam following the transabdominal exam to visualize the endometrium and ovaries.  COMPARISON:  Ultrasound 07/11/2012  FINDINGS: Uterus  Measurements: Normal in size and homogeneous in echotexture measuring 7.7 x 3.1 x 4.8 cm. Several nabothian cysts in lower uterine segment.  Endometrium  Thickness: Normal in thickness for premenopausal female at 1910  mm. No nodularity.  Right ovary  Measurements: Normal in size at 3.9 x 2.5 x 2.6 cm. There is a rounded complex cysts within the right ovary measuring 2.5 cm with a peripheral nodularity but no significant blood flow. Nodules measure up to 7 mm in there are thickened septations.  Left ovary  Measurements: Normal in size at 3.6 x 1.6 x 3.7 cm. Smaller complex cysts within the left ovary measures 2.1 x 1.4 x 1 per 8 cm and most consistent with a small hemorrhagic cyst or corpus luteal cyst.  Other findings  Small amount free fluid.  IMPRESSION: 1. Complex cysts in the right ovary with mural nodularity without blood flow. While this may represent a complex hemorrhagic cyst, cannot exclude a ovarian neoplasm. Recommend followup pelvic  ultrasound in 6 to 12 weeks and if findings persist recommend GYN consultation. 2. Benign-appearing right hemorrhagic or corpus luteal cyst of the left ovary. 3. Uterus and endometrium appear normal. Nabothian cysts in the lower uterine segment.   Electronically Signed   By: Genevive BiStewart  Edmunds M.D.   On: 08/27/2014 12:07     EKG Interpretation None      MDM   Final diagnoses:  Adnexal pain  Cyst of right ovary  DUB (dysfunctional uterine bleeding)    Patient here with symptoms consistent with dysfunctional uterine bleeding but also having significant right adnexal tenderness. Denies any history concerning for STD but GC and Chlamydia swab was done. Transvaginal ultrasound pending for further evaluation of the right adnexa. Patient does have a prior history of ovarian cyst. Low suspicion for torsion. UPT negative. Hemoglobin normal at 13.  12:36 PM Ultrasound showed a complex ovarian cyst on the right side but they recommended follow-up in 6-12 weeks. These findings were communicated patient will follow-up with women's   Gwyneth SproutWhitney Ayelet Gruenewald, MD 08/27/14 1236

## 2014-08-28 LAB — GC/CHLAMYDIA PROBE AMP
CT PROBE, AMP APTIMA: NEGATIVE
GC PROBE AMP APTIMA: NEGATIVE

## 2014-10-11 ENCOUNTER — Emergency Department (HOSPITAL_COMMUNITY)
Admission: EM | Admit: 2014-10-11 | Discharge: 2014-10-11 | Disposition: A | Discharge status: Home / Self Care | Payer: Self-pay | Attending: Emergency Medicine | Admitting: Emergency Medicine

## 2014-10-11 ENCOUNTER — Encounter (HOSPITAL_COMMUNITY): Payer: Self-pay | Admitting: Emergency Medicine

## 2014-10-11 DIAGNOSIS — Z8679 Personal history of other diseases of the circulatory system: Secondary | ICD-10-CM | POA: Insufficient documentation

## 2014-10-11 DIAGNOSIS — Z720 Tobacco use: Secondary | ICD-10-CM | POA: Insufficient documentation

## 2014-10-11 DIAGNOSIS — J3489 Other specified disorders of nose and nasal sinuses: Secondary | ICD-10-CM | POA: Insufficient documentation

## 2014-10-11 DIAGNOSIS — J0100 Acute maxillary sinusitis, unspecified: Secondary | ICD-10-CM | POA: Insufficient documentation

## 2014-10-11 DIAGNOSIS — Z791 Long term (current) use of non-steroidal anti-inflammatories (NSAID): Secondary | ICD-10-CM | POA: Insufficient documentation

## 2014-10-11 MED ORDER — HYDROCODONE-ACETAMINOPHEN 5-325 MG PO TABS
1.0000 | ORAL_TABLET | Freq: Once | ORAL | Status: AC
  Administered 2014-10-11: 1 via ORAL
  Filled 2014-10-11: qty 1

## 2014-10-11 MED ORDER — HYDROCODONE-ACETAMINOPHEN 5-325 MG PO TABS: 1.0000 | ORAL_TABLET | ORAL | Status: DC | PRN

## 2014-10-11 MED ORDER — AMOXICILLIN-POT CLAVULANATE 875-125 MG PO TABS: 1.0000 | ORAL_TABLET | Freq: Two times a day (BID) | ORAL | Status: DC

## 2014-10-11 NOTE — ED Notes (Addendum)
Pt from home c/o right ear pain x 1 week ago. Per patient drainage present and ear is throbbing. Tylenol was taken about 1 hour ago.
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
## 2014-10-11 NOTE — Discharge Instructions (Signed)
Read the information below.  Use the prescribed medication as directed.  Please discuss all new medications with your pharmacist.  Do not take additional tylenol while taking the prescribed pain medication to avoid overdose.  You may return to the Emergency Department at any time for worsening condition or any new symptoms that concern you.  If there is any possibility that you might be pregnant, please let your health care provider know and discuss this with the pharmacist to ensure medication safety.  If you develop fevers, uncontrolled pain, uncontrolled vomiting, or difficulty breathing or swallowing return to the ER for a recheck.      Sinusitis Sinusitis is redness, soreness, and inflammation of the paranasal sinuses. Paranasal sinuses are air pockets within the bones of your face (beneath the eyes, the middle of the forehead, or above the eyes). In healthy paranasal sinuses, mucus is able to drain out, and air is able to circulate through them by way of your nose. However, when your paranasal sinuses are inflamed, mucus and air can become trapped. This can allow bacteria and other germs to grow and cause infection. Sinusitis can develop quickly and last only a short time (acute) or continue over a long period (chronic). Sinusitis that lasts for more than 12 weeks is considered chronic.  CAUSES  Causes of sinusitis include:  Allergies.  Structural abnormalities, such as displacement of the cartilage that separates your nostrils (deviated septum), which can decrease the air flow through your nose and sinuses and affect sinus drainage.  Functional abnormalities, such as when the small hairs (cilia) that line your sinuses and help remove mucus do not work properly or are not present. SIGNS AND SYMPTOMS  Symptoms of acute and chronic sinusitis are the same. The primary symptoms are pain and pressure around the affected sinuses. Other symptoms include:  Upper  toothache.  Earache.  Headache.  Bad breath.  Decreased sense of smell and taste.  A cough, which worsens when you are lying flat.  Fatigue.  Fever.  Thick drainage from your nose, which often is green and may contain pus (purulent).  Swelling and warmth over the affected sinuses. DIAGNOSIS  Your health care provider will perform a physical exam. During the exam, your health care provider may:  Look in your nose for signs of abnormal growths in your nostrils (nasal polyps).  Tap over the affected sinus to check for signs of infection.  View the inside of your sinuses (endoscopy) using an imaging device that has a light attached (endoscope). If your health care provider suspects that you have chronic sinusitis, one or more of the following tests may be recommended:  Allergy tests.  Nasal culture. A sample of mucus is taken from your nose, sent to a lab, and screened for bacteria.  Nasal cytology. A sample of mucus is taken from your nose and examined by your health care provider to determine if your sinusitis is related to an allergy. TREATMENT  Most cases of acute sinusitis are related to a viral infection and will resolve on their own within 10 days. Sometimes medicines are prescribed to help relieve symptoms (pain medicine, decongestants, nasal steroid sprays, or saline sprays).  However, for sinusitis related to a bacterial infection, your health care provider will prescribe antibiotic medicines. These are medicines that will help kill the bacteria causing the infection.  Rarely, sinusitis is caused by a fungal infection. In theses cases, your health care provider will prescribe antifungal medicine. For some cases of chronic sinusitis, surgery  is needed. Generally, these are cases in which sinusitis recurs more than 3 times per year, despite other treatments. HOME CARE INSTRUCTIONS   Drink plenty of water. Water helps thin the mucus so your sinuses can drain more  easily.  Use a humidifier.  Inhale steam 3 to 4 times a day (for example, sit in the bathroom with the shower running).  Apply a warm, moist washcloth to your face 3 to 4 times a day, or as directed by your health care provider.  Use saline nasal sprays to help moisten and clean your sinuses.  Take medicines only as directed by your health care provider.  If you were prescribed either an antibiotic or antifungal medicine, finish it all even if you start to feel better. SEEK IMMEDIATE MEDICAL CARE IF:  You have increasing pain or severe headaches.  You have nausea, vomiting, or drowsiness.  You have swelling around your face.  You have vision problems.  You have a stiff neck.  You have difficulty breathing. MAKE SURE YOU:   Understand these instructions.  Will watch your condition.  Will get help right away if you are not doing well or get worse. Document Released: 09/25/2005 Document Revised: 02/09/2014 Document Reviewed: 10/10/2011 [facility] Patient Information 2015 [place], [place]. This information is not intended to replace advice given to you by your health care provider. Make sure you discuss any questions you have with your health care provider.

## 2014-12-03 ENCOUNTER — Inpatient Hospital Stay (HOSPITAL_COMMUNITY)
Admission: AD | Admit: 2014-12-03 | Discharge: 2014-12-03 | Disposition: A | Discharge status: Home / Self Care | Payer: Self-pay | Source: Ambulatory Visit | Attending: Family Medicine | Admitting: Family Medicine

## 2014-12-03 ENCOUNTER — Encounter (HOSPITAL_COMMUNITY): Payer: Self-pay | Admitting: *Deleted

## 2014-12-03 ENCOUNTER — Inpatient Hospital Stay (HOSPITAL_COMMUNITY): Payer: Self-pay

## 2014-12-03 DIAGNOSIS — O209 Hemorrhage in early pregnancy, unspecified: Secondary | ICD-10-CM | POA: Insufficient documentation

## 2014-12-03 DIAGNOSIS — O0990 Supervision of high risk pregnancy, unspecified, unspecified trimester: Secondary | ICD-10-CM

## 2014-12-03 DIAGNOSIS — O99331 Smoking (tobacco) complicating pregnancy, first trimester: Secondary | ICD-10-CM | POA: Insufficient documentation

## 2014-12-03 DIAGNOSIS — O009 Unspecified ectopic pregnancy without intrauterine pregnancy: Secondary | ICD-10-CM

## 2014-12-03 DIAGNOSIS — F17200 Nicotine dependence, unspecified, uncomplicated: Secondary | ICD-10-CM | POA: Diagnosis present

## 2014-12-03 DIAGNOSIS — F17210 Nicotine dependence, cigarettes, uncomplicated: Secondary | ICD-10-CM | POA: Insufficient documentation

## 2014-12-03 DIAGNOSIS — Z3A01 Less than 8 weeks gestation of pregnancy: Secondary | ICD-10-CM | POA: Insufficient documentation

## 2014-12-03 HISTORY — DX: Unspecified ovarian cyst, unspecified side: N83.209

## 2014-12-03 HISTORY — DX: Supervision of high risk pregnancy, unspecified, unspecified trimester: O09.90

## 2014-12-03 LAB — URINE MICROSCOPIC-ADD ON

## 2014-12-03 LAB — CBC
HEMATOCRIT: 38.1 % (ref 36.0–46.0)
Hemoglobin: 12.6 g/dL (ref 12.0–15.0)
MCH: 30.4 pg (ref 26.0–34.0)
MCHC: 33.1 g/dL (ref 30.0–36.0)
MCV: 91.8 fL (ref 78.0–100.0)
Platelets: 361 10*3/uL (ref 150–400)
RBC: 4.15 MIL/uL (ref 3.87–5.11)
RDW: 12.2 % (ref 11.5–15.5)
WBC: 6.5 10*3/uL (ref 4.0–10.5)

## 2014-12-03 LAB — URINALYSIS, ROUTINE W REFLEX MICROSCOPIC
Bilirubin Urine: NEGATIVE
Glucose, UA: NEGATIVE mg/dL
KETONES UR: 15 mg/dL — AB
Leukocytes, UA: NEGATIVE
NITRITE: NEGATIVE
PROTEIN: 30 mg/dL — AB
Urobilinogen, UA: 0.2 mg/dL (ref 0.0–1.0)
pH: 6 (ref 5.0–8.0)

## 2014-12-03 LAB — HCG, QUANTITATIVE, PREGNANCY: hCG, Beta Chain, Quant, S: 3570 m[IU]/mL — ABNORMAL HIGH (ref <5)

## 2014-12-03 LAB — HEPATIC FUNCTION PANEL
ALK PHOS: 61 U/L (ref 39–117)
ALT: 9 U/L (ref 0–35)
AST: 15 U/L (ref 0–37)
Albumin: 4 g/dL (ref 3.5–5.2)
BILIRUBIN TOTAL: 0.4 mg/dL (ref 0.3–1.2)
Bilirubin, Direct: <0.1 mg/dL (ref 0.0–0.5)
Total Protein: 7.3 g/dL (ref 6.0–8.3)

## 2014-12-03 LAB — WET PREP, GENITAL
Clue Cells Wet Prep HPF POC: NONE SEEN
Trich, Wet Prep: NONE SEEN
WBC WET PREP: NONE SEEN
Yeast Wet Prep HPF POC: NONE SEEN

## 2014-12-03 LAB — ABO/RH: ABO/RH(D): O NEG

## 2014-12-03 LAB — POCT PREGNANCY, URINE: Preg Test, Ur: POSITIVE — AB

## 2014-12-03 MED ORDER — METHOTREXATE INJECTION FOR WOMEN'S HOSPITAL
50.0000 mg/m2 | Freq: Once | INTRAMUSCULAR | Status: AC
  Administered 2014-12-03: 85 mg via INTRAMUSCULAR
  Filled 2014-12-03: qty 1.7

## 2014-12-03 MED ORDER — HYDROCODONE-ACETAMINOPHEN 5-325 MG PO TABS: 1.0000 | ORAL_TABLET | Freq: Four times a day (QID) | ORAL | Status: DC | PRN

## 2014-12-03 MED ORDER — PROMETHAZINE HCL 25 MG PO TABS
25.0000 mg | ORAL_TABLET | Freq: Once | ORAL | Status: AC
  Administered 2014-12-03: 25 mg via ORAL
  Filled 2014-12-03: qty 1

## 2014-12-03 MED ORDER — OXYCODONE-ACETAMINOPHEN 5-325 MG PO TABS
1.0000 | ORAL_TABLET | Freq: Once | ORAL | Status: AC
Start: 2014-12-03 — End: 2014-12-03
  Administered 2014-12-03: 1 via ORAL
  Filled 2014-12-03: qty 1

## 2014-12-03 MED ORDER — RHO D IMMUNE GLOBULIN 1500 UNIT/2ML IJ SOSY
300.0000 ug | PREFILLED_SYRINGE | Freq: Once | INTRAMUSCULAR | Status: AC
  Administered 2014-12-03: 300 ug via INTRAMUSCULAR
  Filled 2014-12-03: qty 2

## 2014-12-03 NOTE — MAU Note (Signed)
[facility]. Pt reports she started having vaginal bleeding 2 days ago that has gotten heavier. C/o cramps and abd pain as well.

## 2014-12-03 NOTE — MAU Note (Signed)
Pt instructed to follow up on Sunday 2/28

## 2014-12-03 NOTE — MAU Provider Note (Signed)
History     CSN: [id number]  Arrival date and time: 12/03/14 1123   First Provider Initiated Contact with Patient 12/03/14 1214      Chief Complaint  Patient presents with  . Vaginal Bleeding   HPI Comments: Olivia [name] 29 y.o. G1P0 [redacted]w[redacted]d presents to MAU with vaginal bleeding and pain. The pain is 10/10 she would like something for pain. She also has a headache. She had a HPT that was positive 2 days ago  Vaginal Bleeding      Past Medical History  Diagnosis Date  . Migraine   . Ovarian cyst     Past Surgical History  Procedure Laterality Date  . Wisdom tooth extraction      Family History  Problem Relation Age of Onset  . Hypertension Mother   . Hypertension Father   . Migraines Mother   . Cancer Father     prostate    History  Substance Use Topics  . Smoking status: Current Every Day Smoker -- 0.50 packs/day    Types: Cigarettes  . Smokeless tobacco: Not on file  . Alcohol Use: No    Allergies: No Known Allergies  Prescriptions prior to admission  Medication Sig Dispense Refill Last Dose  . amoxicillin-clavulanate (AUGMENTIN) 875-125 MG per tablet Take 1 tablet by mouth every 12 (twelve) hours. (Patient not taking: Reported on 12/03/2014) 14 tablet 0 Completed Course at Unknown time  . HYDROcodone-acetaminophen (NORCO/VICODIN) 5-325 MG per tablet Take 1-2 tablets by mouth every 4 (four) hours as needed for moderate pain or severe pain. (Patient not taking: Reported on 12/03/2014) 15 tablet 0   . naproxen (NAPROSYN) 500 MG tablet Take 1 tablet (500 mg total) by mouth 2 (two) times daily. (Patient not taking: Reported on 12/03/2014) 20 tablet 0     Review of Systems  Constitutional: Negative.   HENT: Negative.   Eyes: Negative.   Respiratory: Negative.   Cardiovascular: Negative.   Gastrointestinal: Negative.   Genitourinary: Positive for vaginal bleeding.          Musculoskeletal: Negative.   Skin: Negative.   Neurological: Negative.    Psychiatric/Behavioral: The patient is nervous/anxious.    Physical Exam   Blood pressure 132/89, pulse 69, temperature 98.4 F (36.9 C), temperature source Oral, resp. rate 18, height  (1.549 m), weight 149 lb 6 oz (67.756 kg), last menstrual period 10/15/2014, SpO2 100 %.  Physical Exam  Constitutional: She is oriented to person, place, and time. She appears well-developed and well-nourished. No distress.  HENT:  Head: Normocephalic and atraumatic.  GI: Soft. She exhibits no distension. There is tenderness. There is no rebound and no guarding.  Genitourinary:  Genital:External negative Vaginal:blood in vault Cervix:POC appear to be coming from os Bimanual:tenderness in adnexa   Neurological: She is alert and oriented to person, place, and time.  Skin: Skin is warm and dry.  Psychiatric: She has a normal mood and affect. Her behavior is normal. Judgment and thought content normal.   Results for orders placed or performed during the hospital encounter of 12/03/14 (from the past 24 hour(s))  Urinalysis, Routine w reflex microscopic     Status: Abnormal   Collection Time: 12/03/14 11:41 AM  Result Value Ref Range   Color, Urine RED (A) YELLOW   APPearance HAZY (A) CLEAR   Specific Gravity, Urine >1.030 (H) 1.005 - 1.030   pH 6.0 5.0 - 8.0   Glucose, UA NEGATIVE NEGATIVE mg/dL   Hgb urine dipstick LARGE (A)  NEGATIVE   Bilirubin Urine NEGATIVE NEGATIVE   Ketones, ur 15 (A) NEGATIVE mg/dL   Protein, ur 30 (A) NEGATIVE mg/dL   Urobilinogen, UA 0.2 0.0 - 1.0 mg/dL   Nitrite NEGATIVE NEGATIVE   Leukocytes, UA NEGATIVE NEGATIVE  Urine microscopic-add on     Status: Abnormal   Collection Time: 12/03/14 11:41 AM  Result Value Ref Range   Squamous Epithelial / LPF FEW (A) RARE   WBC, UA 3-6 <3 WBC/hpf   RBC / HPF TOO NUMEROUS TO COUNT <3 RBC/hpf   Bacteria, UA RARE RARE   Urine-Other MUCOUS PRESENT   Pregnancy, urine POC     Status: Abnormal   Collection Time: 12/03/14 11:54  AM  Result Value Ref Range   Preg Test, Ur POSITIVE (A) NEGATIVE  CBC     Status: None   Collection Time: 12/03/14 12:35 PM  Result Value Ref Range   WBC 6.5 4.0 - 10.5 K/uL   RBC 4.15 3.87 - 5.11 MIL/uL   Hemoglobin 12.6 12.0 - 15.0 g/dL   HCT 40.938.1 81.136.0 - 91.446.0 %   MCV 91.8 78.0 - 100.0 fL   MCH 30.4 26.0 - 34.0 pg   MCHC 33.1 30.0 - 36.0 g/dL   RDW 78.212.2 95.611.5 - 21.315.5 %   Platelets 361 150 - 400 K/uL  ABO/Rh     Status: None   Collection Time: 12/03/14 12:35 PM  Result Value Ref Range   ABO/RH(D) O NEG   hCG, quantitative, pregnancy     Status: Abnormal   Collection Time: 12/03/14 12:35 PM  Result Value Ref Range   hCG, Beta Chain, Quant, S 3570 (H) <5 mIU/mL  Rh IG workup (includes ABO/Rh)     Status: None (Preliminary result)   Collection Time: 12/03/14 12:35 PM  Result Value Ref Range   Gestational Age(Wks) 6    ABO/RH(D) O NEG    Antibody Screen NEG    Unit Number [id number]    Blood Component Type RHIG    Unit division 00    Status of Unit ISSUED    Transfusion Status OK TO TRANSFUSE   Hepatic function panel     Status: None   Collection Time: 12/03/14 12:35 PM  Result Value Ref Range   Total Protein 7.3 6.0 - 8.3 g/dL   Albumin 4.0 3.5 - 5.2 g/dL   AST 15 0 - 37 U/L   ALT 9 0 - 35 U/L   Alkaline Phosphatase 61 39 - 117 U/L   Total Bilirubin 0.4 0.3 - 1.2 mg/dL   Bilirubin, Direct <5.2<0.1 0.0 - 0.5 mg/dL   Indirect Bilirubin NOT CALCULATED 0.3 - 0.9 mg/dL  Wet prep, genital     Status: None   Collection Time: 12/03/14 12:53 PM  Result Value Ref Range   Yeast Wet Prep HPF POC NONE SEEN NONE SEEN   Trich, Wet Prep NONE SEEN NONE SEEN   Clue Cells Wet Prep HPF POC NONE SEEN NONE SEEN   WBC, Wet Prep HPF POC NONE SEEN NONE SEEN   [place] Ob Comp Less 14 Wks  12/03/2014   CLINICAL DATA:  Pregnant, heavy bleeding  EXAM: OBSTETRIC <14 WK US AND TRANSVAGINAL OB US  TECHNIQUE: Both transabdominal and transvaginal ultrasound examinations were performed for complete  evaluation of the gestation as well as the maternal uterus, adnexal regions, and pelvic cul-de-sac. Transvaginal technique was performed to assess early pregnancy.  COMPARISON:  None.  FINDINGS: Intrauterine gestational sac: Not visualized  Maternal uterus/adnexae: Endometrial  complex measures 10 mm.  Complex Nabothian cysts with debris, unchanged.  Bilateral ovaries are within normal limits, noting a left corpus luteal cyst.  No free fluid.  IMPRESSION: No IUP is visualized.  Endometrial complex measures 10 mm.  By definition, this reflects a pregnancy of unknown location. Differential considerations include early IUP, abnormal IUP/missed abortion (favored given clinical history/beta HCG), or nonvisualized ectopic pregnancy.  Serial beta HCG is suggested, with repeat pelvic ultrasound as clinically warranted.   Electronically Signed   By: Charline Bills M.D.   On: 12/03/2014 14:50   US Ob Transvaginal  12/03/2014   CLINICAL DATA:  Pregnant, heavy bleeding  EXAM: OBSTETRIC <14 WK [place] AND TRANSVAGINAL OB US  TECHNIQUE: Both transabdominal and transvaginal ultrasound examinations were performed for complete evaluation of the gestation as well as the maternal uterus, adnexal regions, and pelvic cul-de-sac. Transvaginal technique was performed to assess early pregnancy.  COMPARISON:  None.  FINDINGS: Intrauterine gestational sac: Not visualized  Maternal uterus/adnexae: Endometrial complex measures 10 mm.  Complex Nabothian cysts with debris, unchanged.  Bilateral ovaries are within normal limits, noting a left corpus luteal cyst.  No free fluid.  IMPRESSION: No IUP is visualized.  Endometrial complex measures 10 mm.  By definition, this reflects a pregnancy of unknown location. Differential considerations include early IUP, abnormal IUP/missed abortion (favored given clinical history/beta HCG), or nonvisualized ectopic pregnancy.  Serial beta HCG is suggested, with repeat pelvic ultrasound as clinically warranted.    Electronically Signed   By: Charline Bills M.D.   On: 12/03/2014 14:50    MAU Course  Procedures  MDM Wet prep, GC, Chlamydia, CBC, UA, U/S, ABORh, Quant, HIV Percocet/ Phenergan/ was helpful with pain Care transferred to [facility], NP at 3:30 pm Assessment and Plan    NEESE,HOPE 12/03/2014, 5:51 PM    Results for orders placed or performed during the hospital encounter of 12/03/14 (from the past 24 hour(s))  Urinalysis, Routine w reflex microscopic     Status: Abnormal   Collection Time: 12/03/14 11:41 AM  Result Value Ref Range   Color, Urine RED (A) YELLOW   APPearance HAZY (A) CLEAR   Specific Gravity, Urine >1.030 (H) 1.005 - 1.030   pH 6.0 5.0 - 8.0   Glucose, UA NEGATIVE NEGATIVE mg/dL   Hgb urine dipstick LARGE (A) NEGATIVE   Bilirubin Urine NEGATIVE NEGATIVE   Ketones, ur 15 (A) NEGATIVE mg/dL   Protein, ur 30 (A) NEGATIVE mg/dL   Urobilinogen, UA 0.2 0.0 - 1.0 mg/dL   Nitrite NEGATIVE NEGATIVE   Leukocytes, UA NEGATIVE NEGATIVE  Urine microscopic-add on     Status: Abnormal   Collection Time: 12/03/14 11:41 AM  Result Value Ref Range   Squamous Epithelial / LPF FEW (A) RARE   WBC, UA 3-6 <3 WBC/hpf   RBC / HPF TOO NUMEROUS TO COUNT <3 RBC/hpf   Bacteria, UA RARE RARE   Urine-Other MUCOUS PRESENT   Pregnancy, urine POC     Status: Abnormal   Collection Time: 12/03/14 11:54 AM  Result Value Ref Range   Preg Test, Ur POSITIVE (A) NEGATIVE  CBC     Status: None   Collection Time: 12/03/14 12:35 PM  Result Value Ref Range   WBC 6.5 4.0 - 10.5 K/uL   RBC 4.15 3.87 - 5.11 MIL/uL   Hemoglobin 12.6 12.0 - 15.0 g/dL   HCT 16.1 09.6 - 04.5 %   MCV 91.8 78.0 - 100.0 fL   MCH 30.4 26.0 -  34.0 pg   MCHC 33.1 30.0 - 36.0 g/dL   RDW 16.1 09.6 - 04.5 %   Platelets 361 150 - 400 K/uL  ABO/Rh     Status: None   Collection Time: 12/03/14 12:35 PM  Result Value Ref Range   ABO/RH(D) O NEG   hCG, quantitative, pregnancy     Status: Abnormal   Collection Time:  12/03/14 12:35 PM  Result Value Ref Range   hCG, Beta Chain, Quant, S 3570 (H) <5 mIU/mL  Rh IG workup (includes ABO/Rh)     Status: None (Preliminary result)   Collection Time: 12/03/14 12:35 PM  Result Value Ref Range   Gestational Age(Wks) 6    ABO/RH(D) O NEG    Antibody Screen NEG    Unit Number [id number]    Blood Component Type RHIG    Unit division 00    Status of Unit ISSUED    Transfusion Status OK TO TRANSFUSE   Hepatic function panel     Status: None   Collection Time: 12/03/14 12:35 PM  Result Value Ref Range   Total Protein 7.3 6.0 - 8.3 g/dL   Albumin 4.0 3.5 - 5.2 g/dL   AST 15 0 - 37 U/L   ALT 9 0 - 35 U/L   Alkaline Phosphatase 61 39 - 117 U/L   Total Bilirubin 0.4 0.3 - 1.2 mg/dL   Bilirubin, Direct <9.5 0.0 - 0.5 mg/dL   Indirect Bilirubin NOT CALCULATED 0.3 - 0.9 mg/dL  Wet prep, genital     Status: None   Collection Time: 12/03/14 12:53 PM  Result Value Ref Range   Yeast Wet Prep HPF POC NONE SEEN NONE SEEN   Trich, Wet Prep NONE SEEN NONE SEEN   Clue Cells Wet Prep HPF POC NONE SEEN NONE SEEN   WBC, Wet Prep HPF POC NONE SEEN NONE SEEN   rhogam given prior to d/c  @ 1630 Dr. Shawnie Pons in to evaluate the patient and discuss ultrasound findings and options. Patient elects to have MTX today. Strict ectopic precautions given to the patient. She voices understanding and agrees with plan. She will return 2/28 for repeat Bhcg or sooner for problems. Stable for d/c without severe pain or heavy bleeding.

## 2014-12-04 LAB — RH IG WORKUP (INCLUDES ABO/RH)
ABO/RH(D): O NEG
Antibody Screen: NEGATIVE
GESTATIONAL AGE(WKS): 6
Unit division: 0

## 2014-12-04 LAB — HIV ANTIBODY (ROUTINE TESTING W REFLEX): HIV Screen 4th Generation wRfx: NONREACTIVE

## 2014-12-04 LAB — GC/CHLAMYDIA PROBE AMP (~~LOC~~) NOT AT ARMC
Chlamydia: NEGATIVE
Neisseria Gonorrhea: NEGATIVE

## 2014-12-08 ENCOUNTER — Inpatient Hospital Stay (HOSPITAL_COMMUNITY)
Admission: AD | Admit: 2014-12-08 | Discharge: 2014-12-08 | Disposition: A | Discharge status: Home / Self Care | Payer: Self-pay | Source: Ambulatory Visit | Attending: Family Medicine | Admitting: Family Medicine

## 2014-12-08 DIAGNOSIS — O009 Unspecified ectopic pregnancy without intrauterine pregnancy: Secondary | ICD-10-CM

## 2014-12-08 DIAGNOSIS — F17210 Nicotine dependence, cigarettes, uncomplicated: Secondary | ICD-10-CM | POA: Insufficient documentation

## 2014-12-08 DIAGNOSIS — O001 Tubal pregnancy: Secondary | ICD-10-CM | POA: Insufficient documentation

## 2014-12-08 LAB — HCG, QUANTITATIVE, PREGNANCY: hCG, Beta Chain, Quant, S: 155 m[IU]/mL — ABNORMAL HIGH (ref <5)

## 2014-12-08 NOTE — Discharge Instructions (Signed)
Methotrexate Treatment for an Ectopic Pregnancy °Methotrexate is a medicine that treats ectopic pregnancy by stopping the growth of the fertilized egg. It also helps your body absorb tissue from the egg. This takes between 2 weeks and 6 weeks. Most ectopic pregnancies can be successfully treated with methotrexate if they are detected early enough. °LET YOUR HEALTH CARE PROVIDER KNOW ABOUT: °· Any allergies you have. °· All medicines you are taking, including vitamins, herbs, eye drops, creams, and over-the-counter medicines. °· Medical conditions you have. °RISKS AND COMPLICATIONS °Generally, this is a safe treatment. However, as with any treatment, problems can occur. Possible problems or side effects include: °· Nausea. °· Vomiting. °· Diarrhea. °· Abdominal cramping. °· Mouth sores. °· Increased vaginal bleeding or spotting.   °· Swelling or irritation of the lining of your lungs (pneumonitis).  °· Failed treatment and continuation of the pregnancy.   °· Liver damage. °· Hair loss. °There is still a risk of the ectopic pregnancy rupturing while using the methotrexate. °BEFORE THE PROCEDURE °Before you take the medicine:  °· Liver tests, kidney tests, and a complete blood test are performed. °· Blood tests are performed to measure the pregnancy hormone levels and to determine your blood type. °· If you are Rh-negative and the father is Rh-positive or his Rh type is not known, you will be given a Rho (D) immune globulin shot. °PROCEDURE  °There are two methods that your health care provider may use to prescribe methotrexate. One method involves a single dose or injection of the medicine. Another method involves a series of doses given through several injections.  °AFTER THE PROCEDURE °· You may have some abdominal cramping, vaginal bleeding, and fatigue in the first few days after taking methotrexate. °· Blood tests will be taken for several weeks to check the pregnancy hormone levels. The blood tests are performed  until there is no more pregnancy hormone detected in the blood. °Document Released: 09/19/2001 Document Revised: 02/09/2014 Document Reviewed: 07/14/2013 °[employer]® Patient Information ©2015 [employer]. This information is not intended to replace advice given to you by your health care provider. Make sure you discuss any questions you have with your health care provider. ° °

## 2014-12-08 NOTE — MAU Provider Note (Signed)
Chief Complaint: No chief complaint on file.     SUBJECTIVE HPI: Olivia [name] is a 29 y.o. G1P0 at [redacted]w[redacted]d by LMP who presents for quant on D4 s/p MTX for presumed early ectopic pregnancy. No further abdominal pain and bleeding diminished to scant brown.   Past Medical History  Diagnosis Date  . Migraine   . Ovarian cyst    OB History  Gravida Para Term Preterm AB SAB TAB Ectopic Multiple Living  1         0    # Outcome Date GA Lbr Len/2nd Weight Sex Delivery Anes PTL Lv  1 Current              Past Surgical History  Procedure Laterality Date  . Wisdom tooth extraction     History   Social History  . Marital Status: Single    Spouse Name: N/A  . Number of Children: N/A  . Years of Education: N/A   Occupational History  . Not on file.   Social History Main Topics  . Smoking status: Current Every Day Smoker -- 0.50 packs/day    Types: Cigarettes  . Smokeless tobacco: Not on file  . Alcohol Use: No  . Drug Use: No  . Sexual Activity: Yes   Other Topics Concern  . Not on file   Social History Narrative   No current facility-administered medications on file prior to encounter.   Current Outpatient Prescriptions on File Prior to Encounter  Medication Sig Dispense Refill  . HYDROcodone-acetaminophen (NORCO) 5-325 MG per tablet Take 1 tablet by mouth every 6 (six) hours as needed for moderate pain. 20 tablet 0   No Known Allergies  ROS: Pertinent items in HPI  OBJECTIVE Blood pressure 136/88, pulse 77, temperature 98.7 F (37.1 C), temperature source Oral, resp. rate 16, last menstrual period 10/15/2014. GENERAL: Well-developed, well-nourished female in no acute distress.  HEENT: Normocephalic HEART: normal rate RESP: normal effort ABDOMEN: Soft, non-tender EXTREMITIES: Nontender, no edema NEURO: Alert and oriented    LAB RESULTS Results for orders placed or performed during the hospital encounter of 12/08/14 (from the past 24 hour(s))  hCG,  quantitative, pregnancy     Status: Abnormal   Collection Time: 12/08/14  1:14 PM  Result Value Ref Range   hCG, Beta Chain, Quant, S 155 (H) <5 mIU/mL   Status: Finalresult Visible to patient:  Not Released Nextappt: None              Ref Range 1:14 PM  5d ago     hCG, Beta Chain, Quant, S <5 mIU/mL 155 (H) 3570 (H)CM   Comments:              .res IMAGING US Ob Comp Less 14 Wks  12/03/2014   CLINICAL DATA:  Pregnant, heavy bleeding  EXAM: OBSTETRIC <14 WK [place] AND TRANSVAGINAL OB US  TECHNIQUE: Both transabdominal and transvaginal ultrasound examinations were performed for complete evaluation of the gestation as well as the maternal uterus, adnexal regions, and pelvic cul-de-sac. Transvaginal technique was performed to assess early pregnancy.  COMPARISON:  None.  FINDINGS: Intrauterine gestational sac: Not visualized  Maternal uterus/adnexae: Endometrial complex measures 10 mm.  Complex Nabothian cysts with debris, unchanged.  Bilateral ovaries are within normal limits, noting a left corpus luteal cyst.  No free fluid.  IMPRESSION: No IUP is visualized.  Endometrial complex measures 10 mm.  By definition, this reflects a pregnancy of unknown location. Differential considerations include early IUP, abnormal  IUP/missed abortion (favored given clinical history/beta HCG), or nonvisualized ectopic pregnancy.  Serial beta HCG is suggested, with repeat pelvic ultrasound as clinically warranted.   Electronically Signed   By: Charline BillsSriyesh  Krishnan M.D.   On: 12/03/2014 14:50   [place] Ob Transvaginal  12/03/2014   CLINICAL DATA:  Pregnant, heavy bleeding  EXAM: OBSTETRIC <14 WK US AND TRANSVAGINAL OB US  TECHNIQUE: Both transabdominal and transvaginal ultrasound examinations were performed for complete evaluation of the gestation as well as the maternal uterus, adnexal regions, and pelvic cul-de-sac. Transvaginal technique was performed to assess early pregnancy.  COMPARISON:  None.   FINDINGS: Intrauterine gestational sac: Not visualized  Maternal uterus/adnexae: Endometrial complex measures 10 mm.  Complex Nabothian cysts with debris, unchanged.  Bilateral ovaries are within normal limits, noting a left corpus luteal cyst.  No free fluid.  IMPRESSION: No IUP is visualized.  Endometrial complex measures 10 mm.  By definition, this reflects a pregnancy of unknown location. Differential considerations include early IUP, abnormal IUP/missed abortion (favored given clinical history/beta HCG), or nonvisualized ectopic pregnancy.  Serial beta HCG is suggested, with repeat pelvic ultrasound as clinically warranted.   Electronically Signed   By: Charline BillsSriyesh  Krishnan M.D.   On: 12/03/2014 14:50    MAU COURSE  ASSESSMENT 1. Pregnancy, ectopic   Presumed ectopic D4 s/p MTX, clinically stable, falling quant  PLAN Discharge home with continued ectopic precautions     Medication List    TAKE these medications        HYDROcodone-acetaminophen 5-325 MG per tablet  Commonly known as:  NORCO  Take 1 tablet by mouth every 6 (six) hours as needed for moderate pain.       Follow-up Information    Follow up with Nursepractioner Mau, NP On 12/11/2014.   Why:  Repeat lab test       Danae OrleansDeirdre C Poe, CNM 12/08/2014  2:41 PM

## 2014-12-08 NOTE — MAU Note (Signed)
Pt here for F/U labwork, MTX on 2/25, did not come for labs as scheduled on Sunday.  Pt states she has mild cramping, still bleeding, has changed 2 pads this morning.

## 2015-08-03 ENCOUNTER — Emergency Department (HOSPITAL_COMMUNITY)
Admission: EM | Admit: 2015-08-03 | Discharge: 2015-08-03 | Disposition: A | Discharge status: Home / Self Care | Payer: Self-pay | Attending: Emergency Medicine | Admitting: Emergency Medicine

## 2015-08-03 ENCOUNTER — Encounter (HOSPITAL_COMMUNITY): Payer: Self-pay | Admitting: Emergency Medicine

## 2015-08-03 DIAGNOSIS — N939 Abnormal uterine and vaginal bleeding, unspecified: Secondary | ICD-10-CM | POA: Insufficient documentation

## 2015-08-03 DIAGNOSIS — N898 Other specified noninflammatory disorders of vagina: Secondary | ICD-10-CM | POA: Insufficient documentation

## 2015-08-03 DIAGNOSIS — R1030 Lower abdominal pain, unspecified: Secondary | ICD-10-CM | POA: Insufficient documentation

## 2015-08-03 DIAGNOSIS — Z8679 Personal history of other diseases of the circulatory system: Secondary | ICD-10-CM | POA: Insufficient documentation

## 2015-08-03 DIAGNOSIS — Z720 Tobacco use: Secondary | ICD-10-CM | POA: Insufficient documentation

## 2015-08-03 DIAGNOSIS — Z3202 Encounter for pregnancy test, result negative: Secondary | ICD-10-CM | POA: Insufficient documentation

## 2015-08-03 DIAGNOSIS — R350 Frequency of micturition: Secondary | ICD-10-CM | POA: Insufficient documentation

## 2015-08-03 LAB — URINALYSIS, ROUTINE W REFLEX MICROSCOPIC
Bilirubin Urine: NEGATIVE
Glucose, UA: NEGATIVE mg/dL
Ketones, ur: NEGATIVE mg/dL
NITRITE: NEGATIVE
Protein, ur: NEGATIVE mg/dL
SPECIFIC GRAVITY, URINE: 1.027 (ref 1.005–1.030)
Urobilinogen, UA: 1 mg/dL (ref 0.0–1.0)
pH: 6.5 (ref 5.0–8.0)

## 2015-08-03 LAB — URINE MICROSCOPIC-ADD ON

## 2015-08-03 LAB — WET PREP, GENITAL
CLUE CELLS WET PREP: NONE SEEN
Trich, Wet Prep: NONE SEEN
YEAST WET PREP: NONE SEEN

## 2015-08-03 LAB — POC URINE PREG, ED: Preg Test, Ur: NEGATIVE

## 2015-08-03 NOTE — Progress Notes (Signed)
Pt being visited by ED staff when Cm went to visit

## 2015-08-03 NOTE — ED Provider Notes (Signed)
CSN: [id number]     Arrival date & time 08/03/15  1353 History   First MD Initiated Contact with Patient 08/03/15 1403     Chief Complaint  Patient presents with  . Vaginal Bleeding  . vaginal odor     HPI   Olivia [name] is a 29 y.o. female with a PMH of ovarian cyst who presents to the ED with vaginal bleeding and vaginal odor. She reports her symptoms have been present for about a week. She states her last period was on the 17th. She reports she has vaginal bleeding when she wipes, but has not noticed any blood in the toilet. She states she "smells like something has died." She denies exacerbating or alleviating factors. She denies fever, chills, chest pain, shortness of breath. She reports mild intermittent suprapubic pain. She denies nausea, vomiting, diarrhea, constipation, dysuria, urgency. She reports urinary frequency.    Past Medical History  Diagnosis Date  . Migraine   . Ovarian cyst    Past Surgical History  Procedure Laterality Date  . Wisdom tooth extraction     Family History  Problem Relation Age of Onset  . Hypertension Mother   . Hypertension Father   . Migraines Mother   . Cancer Father     prostate   Social History  Substance Use Topics  . Smoking status: Current Every Day Smoker -- 0.50 packs/day    Types: Cigarettes  . Smokeless tobacco: None  . Alcohol Use: No   OB History    Gravida Para Term Preterm AB TAB SAB Ectopic Multiple Living   1         0      Review of Systems  Constitutional: Negative for fever and chills.  Respiratory: Negative for shortness of breath.   Cardiovascular: Negative for chest pain.  Gastrointestinal: Positive for abdominal pain. Negative for nausea, vomiting, diarrhea and constipation.  Genitourinary: Positive for frequency and vaginal bleeding. Negative for dysuria, urgency, vaginal discharge and vaginal pain.  Neurological: Negative for dizziness, light-headedness and headaches.  All other systems reviewed and  are negative.     Allergies  Review of patient's allergies indicates no known allergies.  Home Medications   Prior to Admission medications   Medication Sig Start Date End Date Taking? Authorizing Provider  HYDROcodone-acetaminophen (NORCO) 5-325 MG per tablet Take 1 tablet by mouth every 6 (six) hours as needed for moderate pain. 12/03/14   Hope Orlene Och, NP    BP 126/91 mmHg  Pulse 99  Temp(Src) 98.2 F (36.8 C) (Oral)  Resp 18  SpO2 100%  LMP 07/28/2015  Breastfeeding? Unknown Physical Exam  Constitutional: She is oriented to person, place, and time. She appears well-developed and well-nourished. No distress.  HENT:  Head: Normocephalic and atraumatic.  Right Ear: External ear normal.  Left Ear: External ear normal.  Nose: Nose normal.  Mouth/Throat: Uvula is midline, oropharynx is clear and moist and mucous membranes are normal.  Eyes: Conjunctivae, EOM and lids are normal. Pupils are equal, round, and reactive to light. Right eye exhibits no discharge. Left eye exhibits no discharge. No scleral icterus.  Neck: Normal range of motion. Neck supple.  Cardiovascular: Normal rate, regular rhythm, normal heart sounds, intact distal pulses and normal pulses.   Pulmonary/Chest: Effort normal and breath sounds normal. No respiratory distress. She has no wheezes. She has no rales.  Abdominal: Soft. Normal appearance and bowel sounds are normal. She exhibits no distension and no mass. There is no tenderness.  There is no rigidity, no rebound and no guarding.  Genitourinary: Uterus is not enlarged and not tender. Cervix exhibits no motion tenderness, no discharge and no friability. Right adnexum displays no mass, no tenderness and no fullness. Left adnexum displays no mass, no tenderness and no fullness. No erythema, tenderness or bleeding in the vagina. No foreign body around the vagina. No signs of injury around the vagina. No vaginal discharge found.  Musculoskeletal: Normal range of  motion. She exhibits no edema or tenderness.  Neurological: She is alert and oriented to person, place, and time.  Skin: Skin is warm, dry and intact. No rash noted. She is not diaphoretic. No erythema. No pallor.  Psychiatric: She has a normal mood and affect. Her speech is normal and behavior is normal.  Nursing note and vitals reviewed.   ED Course  Procedures (including critical care time)  Labs Review Labs Reviewed  WET PREP, GENITAL - Abnormal; Notable for the following:    WBC, Wet Prep HPF POC FEW (*)    All other components within normal limits  URINALYSIS, ROUTINE W REFLEX MICROSCOPIC (NOT AT [facility]) - Abnormal; Notable for the following:    Hgb urine dipstick TRACE (*)    Leukocytes, UA TRACE (*)    All other components within normal limits  URINE MICROSCOPIC-ADD ON - Abnormal; Notable for the following:    Squamous Epithelial / LPF FEW (*)    All other components within normal limits  POC URINE PREG, ED  GC/CHLAMYDIA PROBE AMP ([place]) NOT AT [facility]    Imaging Review No results found.   I have personally reviewed and evaluated these lab results as part of my medical decision-making.   EKG Interpretation None      MDM   Final diagnoses:  Vaginal spotting  Vaginal odor    29 year old female presents with vaginal bleeding and vaginal odor, which she states has been present for approximately 1 week. Also reports mild intermittent suprapubic pain. Denies fever, chills, chest pain, shortness of breath. Denies nausea, vomiting, diarrhea, constipation, dysuria, urgency. Reports urinary frequency. Patient is afebrile. Vital signs stable. Heart regular rate and rhythm. Lungs clear to auscultation bilaterally. Abdomen soft, nontender, nondistended. No rebound, guarding, or masses. No discharge in vaginal vault on pelvic exam. Cervical os closed. No cervical friability or CMT. No tenderness to palpation of adnexa or uterus.  UA with trace hemoglobin and trace leukocytes,  0-2 WBC and 0-2 RBC on microscopic. Urine pregnancy negative. Few WBC on wet prep. Patient is well-appearing. Do not feel treatment is indicated at this time given normal GU exam and reassuring lab results. Patient [facility] for persistent symptoms. Return precautions discussed. Patient verbalizes her understanding and is in agreement with plan.  BP 130/68 mmHg  Pulse 73  Temp(Src) 98.2 F (36.8 C) (Oral)  Resp 16  SpO2 100%  LMP 07/28/2015  Breastfeeding? Unknown      Mady Gemmalizabeth C Dalma Panchal, PA-C 08/03/15 1532  Raeford RazorStephen Kohut, MD 08/03/15 1550

## 2015-08-03 NOTE — ED Notes (Signed)
Pt c/o vaginal bleeding x 4 days with a foul odor.  Pt states that she had her cycle already this month. Pt states that bleeding is bright red when she wipes and having to where a pad.

## 2015-08-03 NOTE — Discharge Instructions (Signed)
1. Medications: usual home medications 2. Treatment: rest, drink plenty of fluids 3. Follow Up: please followup with [facility] for discussion of your diagnoses and further evaluation after today's visit; if you do not have a primary care doctor use the resource guide provided to find one; please return to the ER for severe pain, increased bleeding, new or worsening symptoms    Emergency Department Resource Guide 1) Find a Doctor and Pay Out of Pocket Although you won't have to find out who is covered by your insurance plan, it is a good idea to ask around and get recommendations. You will then need to call the office and see if the doctor you have chosen will accept you as a new patient and what types of options they offer for patients who are self-pay. Some doctors offer discounts or will set up payment plans for their patients who do not have insurance, but you will need to ask so you aren't surprised when you get to your appointment.  2) Contact Your Local Health Department Not all health departments have doctors that can see patients for sick visits, but many do, so it is worth a call to see if yours does. If you don't know where your local health department is, you can check in your phone book. The CDC also has a tool to help you locate your state's health department, and many state websites also have listings of all of their local health departments.  3) Find a Walk-in Clinic If your illness is not likely to be very severe or complicated, you may want to try a walk in clinic. These are popping up all over the country in pharmacies, drugstores, and shopping centers. They're usually staffed by nurse practitioners or physician assistants that have been trained to treat common illnesses and complaints. They're usually fairly quick and inexpensive. However, if you have serious medical issues or chronic medical problems, these are probably not your best  option.  No Primary Care Doctor: - Call Health Connect at  [phone] - they can help you locate a primary care doctor that  accepts your insurance, provides certain services, etc. - Physician Referral Service- [phone]  Chronic Pain Problems: Organization         Address  Phone   Notes  [facility]  [phone] Patients need to be referred by their primary care doctor.   Medication Assistance: Organization         Address  Phone   Notes  [facility] [address] [place], [place] [address] [phone] --Must be a resident of [facility] -- Must have NO insurance coverage whatsoever (no Medicaid/ Medicare, etc.) -- The pt. MUST have a primary care doctor that directs their care regularly and follows them in the community   MedAssist  [phone]   [employer]  [phone]    Agencies that provide inexpensive medical care: Organization         Address  Phone   Notes  [facility]  [phone]   [facility]    [phone]   [facility] [address] [place], [place] [address] [phone]   [facility] [address], [place] [phone]   [facility]anned Parenthood    [phone]   [facility]    [phone]   [facility]  [address], [place] Phone:  [phone], Fax:  [phone] Hours of Operation:  9 am - 6 pm, M-F.  Also accepts Medicaid/Medicare and self-pay.  [facility] [place], [address], [place] Phone: [phone], Fax: [phone]. Hours of Operation:  8:30 am - 5:30 pm, M-F.  Also accepts Medicaid and self-pay.  [facility] [place] [address], [place] Phone: [phone]   [place], [place], [place] [phone], Ext. [phone] Mondays & Thursdays: 7-9 AM.  First 15  patients are seen on a first come, first serve basis.    [place] Providers:  Organization         Address  Phone   Notes  [facility] [address], [place] [phone] Also accepts self-pay patients.  [facility] [address], [place]  [phone]   [place], [address], [place] [phone]   [facility] [address] [place] [phone]   Lucianne Lei [address], [place]   [phone] Only accepts [place] Access [place] patients after they have their name applied to their card.   Self-Pay (no insurance) in [facility]:  Organization         Address  Phone   Notes  Sickle Cell Patients, [facility] [place] [phone]   [facility] [place] [phone]   [facility] [place]  [place], [place], [place] [phone]   [facility]/Dr. Osei-Bonsu  [address], [place] [address], [place] [phone] Phone number for both [place] and [place] locations is the same.  Urgent Medical and [facility] [address] [place] [phone]   [facility] [address], [place] or [address] [phone]   [facility] [address] [place] [phone], phone; [phone], fax Sees patients 1st and 3rd Saturday of every month.  Must not qualify for public or private insurance (i.e. Medicaid, Medicare, [place] Health Choice, Veterans' Benefits)  Household income should be no more than 200% of the poverty level The clinic cannot treat you if you are pregnant or think you are pregnant  Sexually transmitted diseases are not treated at the clinic.    Dental  Care: Organization         Address  Phone  Notes  [facility] Department of [facility] [place] [phone] Accepts children up to age 61 who are enrolled in [place] or [employer] [place]; pregnant women with a Medicaid card; and children who have applied for Medicaid or [place] Health Choice, but were declined, whose parents can pay a reduced fee at time of service.  [facility] Department of [facility]  [address], [place] [phone] Accepts children up to age 47 who are enrolled in [place] or [place]; pregnant women with a Medicaid card; and children who have applied for Medicaid or [place] Health Choice, but were declined, whose parents can pay a reduced fee at time of service.  [facility] Adult Dental Access PROGRAM  [place], [place] [phone]  Patients are seen by appointment only. Walk-ins are not accepted. [place] will see patients 1 years of age and older. Monday - Tuesday (8am-5pm) Most Wednesdays (8:30-5pm) $30 per visit, cash only  [facility] Adult Dental Access PROGRAM  [address], [facility] [phone] Patients are seen by appointment only. Walk-ins are not accepted. [place] will see patients 28 years of age and older. One Wednesday Evening (Monthly: Volunteer Based).  $30 per visit, cash only  [place]  [phone] for adults; Children under age 81, call Graduate Pediatric Dentistry at [phone]. Children aged 82-14, please call [phone] to request a pediatric application.  Dental services are provided in all areas of dental care including fillings, crowns and bridges, complete and partial dentures, implants, gum treatment, root canals, and extractions. Preventive care is also provided. Treatment is provided to both adults and children. Patients are selected via a lottery and there is often a waiting list.   [facility] [address] [place]  [phone] [email or web address]   [facility] [address] [place], [place] [phone], Ext. [phone] Second and Fourth Thursday of each month, opens at 6:30 AM; Clinic ends at 9 AM.  Patients are seen on a first-come first-served basis, and a limited number are seen during each clinic.   [facility]  [address] Hillard Danker [place], [place] [phone]   Eligibility Requirements You must have lived in [place], [place], or [place] counties for at least the last three months.   You cannot be eligible for state or federal sponsored [employer], including [employer], [place], or [employer].   You generally cannot be eligible for healthcare insurance through your employer.    How to apply: Eligibility screenings are held every Tuesday and Wednesday afternoon from 1:00 pm until 4:00 pm. You do not need an appointment for the interview!  [facility] [address] [place], [place]   [place]  [place] Department  [place]  [phone]    Behavioral Health Resources in the Community: Intensive Outpatient Programs Organization         Address  Phone  Notes  [place]. [address], [place], [place] [phone]   [facility] Outpatient [address] [place], [place]   ADS: Alcohol & Drug Svcs [address], [place], [place]   [place] [address],  [place], [place] or [phone]   Substance Abuse Resources Organization         Address  Phone  Notes  Alcohol and Drug Services  [phone]   [place]  [phone]   The [place]   Chinita Pester  [phone]   Residential & Outpatient Substance Abuse Program  [phone]    Psychological Services Organization         Address  Phone  Notes  [facility] [place]  [place]  [phone]   [place] [address], [place] or [phone]    Mobile Crisis Teams Organization         Address  Phone  Notes  [facility]rapeutic Alternatives, Mobile Crisis Care Unit  [phone]   Assertive Psychotherapeutic Services  [address] [employer], [place]   [facility] [address] [place]  [place] [phone]    Self-Help/Support Groups Organization         Address  Phone             Notes  Mental Health Assoc. of [place] - variety of support groups  [place] Call for more information  Narcotics Anonymous (NA), Caring Services [address], [employer] [place]  2 meetings at this location   Special educational needs teacher         Address  Phone  Notes  ASAP Residential Treatment [place],    [place]  [phone]   [facility]  [address] [place] [phone], [place], [place]   [place], [place] [phone] Admissions: 8am-3pm M-F  Incentives Substance [place] [address],    [place], [place] [phone]   [facility] [address] [place], [place], [place]   [facility] [address]  [place], [place]   [facility]ight Programs - Intensive Outpatient [address], [place], [place]   [facility] [place].,  [place], [place] [phone] or [phone]   [facility] Services (RTS) [address], [place], [place] Accepts Medicaid  [facility] [place] [address]  [place] [phone] Substance Abuse/Addiction Treatment   [facility] Organization         Address  Phone  Notes  [facility]  [phone]   Domenic Schwab, PhD [address] Arlis [place], [place]   [phone] or [phone]   [place], [place] [phone]   [facility] [address], [place], [place] [phone] Insurance/Medicaid/sponsorship through [facility] and Families [address] [place]                                    [place], [place] [phone] [place] [address], [place] [phone]    Dr. Adele Schilder  [phone]   [facility] Dept. 1) [address],  2) [place] 3)  [place] [address], [place] [phone]  [phone]   [place] [phone] or [phone] (After Hours)

## 2015-08-04 LAB — GC/CHLAMYDIA PROBE AMP (~~LOC~~) NOT AT ARMC
Chlamydia: NEGATIVE
Neisseria Gonorrhea: NEGATIVE

## 2015-08-22 ENCOUNTER — Encounter (HOSPITAL_COMMUNITY): Payer: Self-pay | Admitting: Emergency Medicine

## 2015-08-22 ENCOUNTER — Emergency Department (HOSPITAL_COMMUNITY)
Admission: EM | Admit: 2015-08-22 | Discharge: 2015-08-22 | Disposition: A | Discharge status: Home / Self Care | Payer: Self-pay | Attending: Emergency Medicine | Admitting: Emergency Medicine

## 2015-08-22 DIAGNOSIS — J029 Acute pharyngitis, unspecified: Secondary | ICD-10-CM | POA: Insufficient documentation

## 2015-08-22 DIAGNOSIS — F17210 Nicotine dependence, cigarettes, uncomplicated: Secondary | ICD-10-CM | POA: Insufficient documentation

## 2015-08-22 DIAGNOSIS — Z79899 Other long term (current) drug therapy: Secondary | ICD-10-CM | POA: Insufficient documentation

## 2015-08-22 DIAGNOSIS — Z8679 Personal history of other diseases of the circulatory system: Secondary | ICD-10-CM | POA: Insufficient documentation

## 2015-08-22 DIAGNOSIS — Z8742 Personal history of other diseases of the female genital tract: Secondary | ICD-10-CM | POA: Insufficient documentation

## 2015-08-22 DIAGNOSIS — H6692 Otitis media, unspecified, left ear: Secondary | ICD-10-CM | POA: Insufficient documentation

## 2015-08-22 MED ORDER — OXYCODONE-ACETAMINOPHEN 5-325 MG PO TABS
1.0000 | ORAL_TABLET | Freq: Once | ORAL | Status: AC
  Administered 2015-08-22: 1 via ORAL
  Filled 2015-08-22: qty 1

## 2015-08-22 MED ORDER — NAPROXEN 500 MG PO TABS
500.0000 mg | ORAL_TABLET | Freq: Once | ORAL | Status: AC
  Administered 2015-08-22: 500 mg via ORAL
  Filled 2015-08-22: qty 1

## 2015-08-22 MED ORDER — NAPROXEN 500 MG PO TABS: 500.0000 mg | ORAL_TABLET | Freq: Two times a day (BID) | ORAL | Status: DC

## 2015-08-22 MED ORDER — AMOXICILLIN-POT CLAVULANATE 875-125 MG PO TABS
1.0000 | ORAL_TABLET | Freq: Once | ORAL | Status: AC
  Administered 2015-08-22: 1 via ORAL
  Filled 2015-08-22: qty 1

## 2015-08-22 MED ORDER — AMOXICILLIN-POT CLAVULANATE 875-125 MG PO TABS
1.0000 | ORAL_TABLET | Freq: Two times a day (BID) | ORAL | Status: DC
Start: 2015-08-22 — End: 2016-03-11

## 2015-08-22 NOTE — ED Notes (Signed)
Pt c/o worsening bilateral ear pain over the past 2 days, worse in the left ear. Says, "I usually have issues with the left ear about this time every year." Denies previous ear surgery/tube placement/etc. Has had yellowish drainage "from throat." Denies any other s/s.

## 2015-08-22 NOTE — Discharge Instructions (Signed)

## 2015-08-22 NOTE — ED Provider Notes (Signed)
CSN: [id number]     Arrival date & time 08/22/15  2112 History   First MD Initiated Contact with Patient 08/22/15 2116     Chief Complaint  Patient presents with  . Otitis Media  . Sore Throat     (Consider location/radiation/quality/duration/timing/severity/associated sxs/prior Treatment) HPI Comments: 29 year old female since the emergency department for complaints of left otalgia which began 2 days ago. Patient states that she has noted worsening pain since onset. No medications taken prior to arrival for symptoms. Patient reports a history of yearly ear infections. She has noticed a scant amount of drainage on her pillow in the morning after sleeping which she believes is from her left ear. She has also had some sore throat recently and has noted some lymph node swelling under her left ear. She denies fever, inability to swallow, drooling, nasal congestion, or rhinorrhea. No known sick contacts. No previous ear surgeries.  Patient is a 29 y.o. female presenting with pharyngitis. The history is provided by the patient. No language interpreter was used.  Sore Throat Associated symptoms include a sore throat.    Past Medical History  Diagnosis Date  . Migraine   . Ovarian cyst    Past Surgical History  Procedure Laterality Date  . Wisdom tooth extraction     Family History  Problem Relation Age of Onset  . Hypertension Mother   . Hypertension Father   . Migraines Mother   . Cancer Father     prostate   Social History  Substance Use Topics  . Smoking status: Current Every Day Smoker -- 0.50 packs/day    Types: Cigarettes  . Smokeless tobacco: None  . Alcohol Use: No   OB History    Gravida Para Term Preterm AB TAB SAB Ectopic Multiple Living   1         0      Review of Systems  HENT: Positive for ear discharge, ear pain and sore throat.   Hematological: Positive for adenopathy.  All other systems reviewed and are negative.   Allergies  Review of patient's  allergies indicates no known allergies.  Home Medications   Prior to Admission medications   Medication Sig Start Date End Date Taking? Authorizing Provider  amoxicillin-clavulanate (AUGMENTIN) 875-125 MG tablet Take 1 tablet by mouth every 12 (twelve) hours. 08/22/15   Antony MaduraKelly Xoey Warmoth, PA-C  ibuprofen (ADVIL,MOTRIN) 200 MG tablet Take 200 mg by mouth every 6 (six) hours as needed for moderate pain.    Historical Provider, MD  Multiple Vitamin (MULTIVITAMIN WITH MINERALS) TABS tablet Take 1 tablet by mouth daily.    Historical Provider, MD  naproxen (NAPROSYN) 500 MG tablet Take 1 tablet (500 mg total) by mouth 2 (two) times daily. 08/22/15   Antony MaduraKelly Quierra Silverio, PA-C  QUEtiapine (SEROQUEL) 300 MG tablet Take 300 mg by mouth at bedtime.    Historical Provider, MD   BP 132/79 mmHg  Pulse 106  Temp(Src) 98.3 F (36.8 C) (Oral)  Resp 17  SpO2 100%  LMP 08/22/2015 (Exact Date)   Physical Exam  Constitutional: She is oriented to person, place, and time. She appears well-developed and well-nourished. No distress.  Nontoxic/nonseptic appearing. Patient tearful.  HENT:  Head: Normocephalic and atraumatic.  Right Ear: Tympanic membrane, external ear and ear canal normal. No mastoid tenderness.  Left Ear: External ear and ear canal normal. No tenderness. No mastoid tenderness. Tympanic membrane is erythematous.  Mouth/Throat: Posterior oropharyngeal erythema present.  Left tympanic membrane is mildly erythematous compared to  the right. Tympanic membrane is also dull; cone of light obscured. Mild retraction noted. No tenderness when palpating the tragus or pulling on the auricle bilaterally. Mild posterior oropharyngeal erythema noted. Uvula midline. Patient tolerating secretions without difficulty or drooling.  Eyes: Conjunctivae and EOM are normal. No scleral icterus.  Neck: Normal range of motion.  No nuchal rigidity or meningismus  Pulmonary/Chest: Effort normal. No respiratory distress.  Respirations  even and unlabored  Musculoskeletal: Normal range of motion.  Lymphadenopathy:       Head (left side): Tonsillar (TTP) adenopathy present.  Neurological: She is alert and oriented to person, place, and time. She exhibits normal muscle tone. Coordination normal.  Skin: Skin is warm and dry. No rash noted. She is not diaphoretic. No erythema. No pallor.  Psychiatric: She has a normal mood and affect. Her behavior is normal.  Nursing note and vitals reviewed.   ED Course  Procedures (including critical care time) Labs Review Labs Reviewed - No data to display  Imaging Review No results found. I have personally reviewed and evaluated these images and lab results as part of my medical decision-making.   EKG Interpretation None      MDM   Final diagnoses:  Acute left otitis media, recurrence not specified, unspecified otitis media type    Patient presents with L otalgia and exam consistent with acute otitis media. No concern for acute mastoiditis, meningitis. No antibiotic use in the last month. Patient discharged home with Augmentin. Advised PCP f/u. I have also discussed reasons to return immediately to the ER. Patient expresses understanding and agrees with plan. Patient discharged in good condition with no unaddressed concerns.     Antony Madura, PA-C 08/22/15 2139  Rolan Bucco, MD 08/22/15 [phone]

## 2015-10-06 ENCOUNTER — Encounter (HOSPITAL_COMMUNITY): Payer: Self-pay | Admitting: Emergency Medicine

## 2015-10-06 ENCOUNTER — Emergency Department (HOSPITAL_COMMUNITY)
Admission: EM | Admit: 2015-10-06 | Discharge: 2015-10-06 | Disposition: A | Discharge status: Home / Self Care | Payer: Self-pay | Attending: Emergency Medicine | Admitting: Emergency Medicine

## 2015-10-06 DIAGNOSIS — R0981 Nasal congestion: Secondary | ICD-10-CM | POA: Insufficient documentation

## 2015-10-06 DIAGNOSIS — J3489 Other specified disorders of nose and nasal sinuses: Secondary | ICD-10-CM | POA: Insufficient documentation

## 2015-10-06 DIAGNOSIS — Z8679 Personal history of other diseases of the circulatory system: Secondary | ICD-10-CM | POA: Insufficient documentation

## 2015-10-06 DIAGNOSIS — Z8742 Personal history of other diseases of the female genital tract: Secondary | ICD-10-CM | POA: Insufficient documentation

## 2015-10-06 DIAGNOSIS — F17210 Nicotine dependence, cigarettes, uncomplicated: Secondary | ICD-10-CM | POA: Insufficient documentation

## 2015-10-06 DIAGNOSIS — H9202 Otalgia, left ear: Secondary | ICD-10-CM | POA: Insufficient documentation

## 2015-10-06 NOTE — ED Notes (Signed)
PA at bedside.

## 2015-10-06 NOTE — Discharge Instructions (Signed)
Recommend to try over the counter mucinex to help break up congestion.  Dayquil/nyquil, theraflu, tylenol cold and sinus are other alternatives. Follow-up with your primary care physician. Return here for new concerns.

## 2015-10-06 NOTE — ED Provider Notes (Signed)
CSN: [id number]     Arrival date & time 10/06/15  0130 History   First MD Initiated Contact with Patient 10/06/15 0449     Chief Complaint  Patient presents with  . Otalgia     (Consider location/radiation/quality/duration/timing/severity/associated sxs/prior Treatment) Patient is a 29 y.o. female presenting with ear pain. The history is provided by the patient and medical records.  Otalgia Associated symptoms: congestion      29 year old female with history of migraine headaches and ovarian cysts, presenting to the ED for nasal congestion and left ear pain for the past 3 days. States initially she thought she has had a mild cold, however her left ear pain worsened and she became concerned for infection. She denies any drainage from the ear. She denies any headache, dizziness, fever, chills, or neck pain. She denies any chest pain, shortness of breath, cough. No sick contacts. She states she took some Sudafed yesterday which seemed to make her drowsy but did not help her symptoms.  Past Medical History  Diagnosis Date  . Migraine   . Ovarian cyst    Past Surgical History  Procedure Laterality Date  . Wisdom tooth extraction     Family History  Problem Relation Age of Onset  . Hypertension Mother   . Hypertension Father   . Migraines Mother   . Cancer Father     prostate   Social History  Substance Use Topics  . Smoking status: Current Every Day Smoker -- 0.50 packs/day    Types: Cigarettes  . Smokeless tobacco: None  . Alcohol Use: No   OB History    Gravida Para Term Preterm AB TAB SAB Ectopic Multiple Living   1         0     Review of Systems  HENT: Positive for congestion and ear pain.   All other systems reviewed and are negative.     Allergies  Review of patient's allergies indicates no known allergies.  Home Medications   Prior to Admission medications   Medication Sig Start Date End Date Taking? Authorizing Provider  ibuprofen (ADVIL,MOTRIN) 200 MG  tablet Take 400 mg by mouth every 6 (six) hours as needed for moderate pain.    Yes Historical Provider, MD  QUEtiapine (SEROQUEL XR) 300 MG 24 hr tablet Take 300 mg by mouth at bedtime.   Yes Historical Provider, MD  QUEtiapine (SEROQUEL) 25 MG tablet Take 25 mg by mouth at bedtime.   Yes Historical Provider, MD  amoxicillin-clavulanate (AUGMENTIN) 875-125 MG tablet Take 1 tablet by mouth every 12 (twelve) hours. Patient not taking: Reported on 10/06/2015 08/22/15   Antony Madura, PA-C  naproxen (NAPROSYN) 500 MG tablet Take 1 tablet (500 mg total) by mouth 2 (two) times daily. Patient not taking: Reported on 10/06/2015 08/22/15   Antony Madura, PA-C   BP 138/96 mmHg  Pulse 91  Temp(Src) 97.6 F (36.4 C) (Oral)  Resp 18  SpO2 98%  LMP 10/15/2014 Physical Exam  Constitutional: She is oriented to person, place, and time. She appears well-developed and well-nourished. No distress.  HENT:  Head: Normocephalic and atraumatic.  Right Ear: Tympanic membrane and ear canal normal.  Left Ear: Tympanic membrane and ear canal normal.  Nose: Mucosal edema and rhinorrhea (Clear) present.  Mouth/Throat: Uvula is midline, oropharynx is clear and moist and mucous membranes are normal. No oropharyngeal exudate, posterior oropharyngeal edema, posterior oropharyngeal erythema or tonsillar abscesses.  Eyes: Conjunctivae and EOM are normal. Pupils are equal, round, and reactive  to light.  Neck: Normal range of motion. Neck supple.  Cardiovascular: Normal rate, regular rhythm and normal heart sounds.   Pulmonary/Chest: Effort normal and breath sounds normal. No respiratory distress. She has no wheezes. She has no rhonchi.  Abdominal: Soft. Bowel sounds are normal. There is no tenderness. There is no guarding.  Musculoskeletal: Normal range of motion.  Neurological: She is alert and oriented to person, place, and time.  Skin: Skin is warm and dry. She is not diaphoretic.  Psychiatric: She has a normal mood and  affect.  Nursing note and vitals reviewed.   ED Course  Procedures (including critical care time) Labs Review Labs Reviewed - No data to display  Imaging Review No results found. I have personally reviewed and evaluated these images and lab results as part of my medical decision-making.   EKG Interpretation None      MDM   Final diagnoses:  Nasal congestion  Left ear pain   29 year old female here with nasal congestion and left ear pain. Patient is afebrile, nontoxic. Her exam is overall noninfectious. She does not have any exam findings concerning for otitis media. Her lungs are clear. Vital signs are stable on room air. Suspect viral etiology of her symptoms. Discharged home with supportive care. Encouraged to follow-up with PCP.  Discussed plan with patient, he/she acknowledged understanding and agreed with plan of care.  Return precautions given for new or worsening symptoms.  Garlon HatchetLisa M Derris Millan, PA-C 10/06/15 [phone]  Alvira MondayErin Schlossman, MD 10/06/15 2140

## 2015-10-06 NOTE — ED Notes (Signed)
Discharge instructions and follow up care reviewed with patient. Patient verbalized understanding. 

## 2015-10-06 NOTE — ED Notes (Signed)
Patient presents from home via EMS for left ear pain x3 days, described as stabbing, also c/o congestion. No drainage noted to same. History of ear infections.  Last VS: 152/88, 108hr, 99%ra.

## 2016-03-11 ENCOUNTER — Emergency Department (HOSPITAL_COMMUNITY): Payer: Self-pay

## 2016-03-11 ENCOUNTER — Emergency Department (HOSPITAL_COMMUNITY)
Admission: EM | Admit: 2016-03-11 | Discharge: 2016-03-11 | Disposition: A | Discharge status: Home / Self Care | Payer: Self-pay | Attending: Emergency Medicine | Admitting: Emergency Medicine

## 2016-03-11 ENCOUNTER — Encounter (HOSPITAL_COMMUNITY): Payer: Self-pay | Admitting: Emergency Medicine

## 2016-03-11 DIAGNOSIS — N939 Abnormal uterine and vaginal bleeding, unspecified: Secondary | ICD-10-CM

## 2016-03-11 DIAGNOSIS — Z3A01 Less than 8 weeks gestation of pregnancy: Secondary | ICD-10-CM | POA: Insufficient documentation

## 2016-03-11 DIAGNOSIS — Z3491 Encounter for supervision of normal pregnancy, unspecified, first trimester: Secondary | ICD-10-CM

## 2016-03-11 DIAGNOSIS — O200 Threatened abortion: Secondary | ICD-10-CM | POA: Insufficient documentation

## 2016-03-11 DIAGNOSIS — R102 Pelvic and perineal pain: Secondary | ICD-10-CM

## 2016-03-11 DIAGNOSIS — F17210 Nicotine dependence, cigarettes, uncomplicated: Secondary | ICD-10-CM | POA: Insufficient documentation

## 2016-03-11 DIAGNOSIS — O99331 Smoking (tobacco) complicating pregnancy, first trimester: Secondary | ICD-10-CM | POA: Insufficient documentation

## 2016-03-11 LAB — BASIC METABOLIC PANEL WITH GFR
Anion gap: 6 (ref 5–15)
BUN: 11 mg/dL (ref 6–20)
CO2: 26 mmol/L (ref 22–32)
Calcium: 9.4 mg/dL (ref 8.9–10.3)
Chloride: 104 mmol/L (ref 101–111)
Creatinine, Ser: 0.93 mg/dL (ref 0.44–1.00)
GFR calc Af Amer: >60 mL/min
GFR calc non Af Amer: >60 mL/min
Glucose, Bld: 76 mg/dL (ref 65–99)
Potassium: 3.9 mmol/L (ref 3.5–5.1)
Sodium: 136 mmol/L (ref 135–145)

## 2016-03-11 LAB — CBC WITH DIFFERENTIAL/PLATELET
BASOS PCT: 1 %
Basophils Absolute: 0.1 10*3/uL (ref 0.0–0.1)
EOS ABS: 0.2 10*3/uL (ref 0.0–0.7)
Eosinophils Relative: 4 %
HCT: 38.3 % (ref 36.0–46.0)
HEMOGLOBIN: 12.6 g/dL (ref 12.0–15.0)
Lymphocytes Relative: 29 %
Lymphs Abs: 2 10*3/uL (ref 0.7–4.0)
MCH: 29.2 pg (ref 26.0–34.0)
MCHC: 32.9 g/dL (ref 30.0–36.0)
MCV: 88.9 fL (ref 78.0–100.0)
Monocytes Absolute: 0.4 10*3/uL (ref 0.1–1.0)
Monocytes Relative: 5 %
NEUTROS PCT: 61 %
Neutro Abs: 4.2 10*3/uL (ref 1.7–7.7)
Platelets: 409 10*3/uL — ABNORMAL HIGH (ref 150–400)
RBC: 4.31 MIL/uL (ref 3.87–5.11)
RDW: 13.4 % (ref 11.5–15.5)
WBC: 6.8 10*3/uL (ref 4.0–10.5)

## 2016-03-11 LAB — WET PREP, GENITAL
CLUE CELLS WET PREP: NONE SEEN
Sperm: NONE SEEN
TRICH WET PREP: NONE SEEN
YEAST WET PREP: NONE SEEN

## 2016-03-11 LAB — I-STAT BETA HCG BLOOD, ED (MC, WL, AP ONLY): HCG, QUANTITATIVE: 1598.1 m[IU]/mL — AB (ref <5)

## 2016-03-11 MED ORDER — RHO D IMMUNE GLOBULIN 1500 UNIT/2ML IJ SOSY
300.0000 ug | PREFILLED_SYRINGE | Freq: Once | INTRAMUSCULAR | Status: AC
Start: 2016-03-11 — End: 2016-03-11
  Administered 2016-03-11: 300 ug via INTRAMUSCULAR
  Filled 2016-03-11: qty 2

## 2016-03-11 MED ORDER — ACETAMINOPHEN 325 MG PO TABS
650.0000 mg | ORAL_TABLET | Freq: Once | ORAL | Status: AC
  Administered 2016-03-11: 650 mg via ORAL
  Filled 2016-03-11: qty 2

## 2016-03-11 NOTE — ED Provider Notes (Addendum)
CSN: [id number]     Arrival date & time 03/11/16  0932 History   First MD Initiated Contact with Patient 03/11/16 [phone]     Chief Complaint  Patient presents with  . Vaginal Bleeding     (Consider location/radiation/quality/duration/timing/severity/associated sxs/prior Treatment) HPI Comments: G1P0 Pt comes in with cc of abd pain and vaginal bleeding. Pt reports that she has hx of ovarian cyst,and she has been having LLQ abd pain x 2 weeks which is not getting better. She also has been having some bleeding the past few days, she has gone through 13 pads in the last 4 days. LMP was May 5. Pt denies any trauma. No uti like sx. Pain is fairly constant.   ROS 10 Systems reviewed and are negative for acute change except as noted in the HPI.     Patient is a 30 y.o. female presenting with vaginal bleeding. The history is provided by the patient.  Vaginal Bleeding   Past Medical History  Diagnosis Date  . Migraine   . Ovarian cyst    Past Surgical History  Procedure Laterality Date  . Wisdom tooth extraction     Family History  Problem Relation Age of Onset  . Hypertension Mother   . Hypertension Father   . Migraines Mother   . Cancer Father     prostate   Social History  Substance Use Topics  . Smoking status: Current Every Day Smoker -- 0.50 packs/day    Types: Cigarettes  . Smokeless tobacco: None  . Alcohol Use: No   OB History    Gravida Para Term Preterm AB TAB SAB Ectopic Multiple Living   1         0     Review of Systems  Genitourinary: Positive for vaginal bleeding.      Allergies  Review of patient's allergies indicates no known allergies.  Home Medications   Prior to Admission medications   Medication Sig Start Date End Date Taking? Authorizing Provider  ibuprofen (ADVIL,MOTRIN) 200 MG tablet Take 400 mg by mouth every 6 (six) hours as needed for moderate pain.    Yes Historical Provider, MD  QUEtiapine (SEROQUEL XR) 300 MG 24 hr tablet Take 300  mg by mouth at bedtime.   Yes Historical Provider, MD  QUEtiapine (SEROQUEL) 25 MG tablet Take 25 mg by mouth daily.    Yes Historical Provider, MD   BP 127/76 mmHg  Pulse 109  Temp(Src) 98.6 F (37 C) (Oral)  Resp 18  SpO2 100%  LMP 02/11/2016 Physical Exam  Constitutional: She is oriented to person, place, and time. She appears well-developed.  HENT:  Head: Normocephalic and atraumatic.  Eyes: Conjunctivae and EOM are normal. Pupils are equal, round, and reactive to light.  Neck: Normal range of motion. Neck supple.  Cardiovascular: Normal rate, regular rhythm, normal heart sounds and intact distal pulses.   No murmur heard. Pulmonary/Chest: Effort normal. No respiratory distress. She has no wheezes.  Abdominal: Soft. Bowel sounds are normal. She exhibits no distension. There is tenderness. There is no rebound and no guarding.  LLQ tenderness  Genitourinary: Vagina normal and uterus normal.  External exam - normal, no lesions Speculum exam: Pt has some white discharge, no blood Bimanual exam: Patient has no CMT, no adnexal tenderness or fullness and cervical os is closed  Neurological: She is alert and oriented to person, place, and time.  Skin: Skin is warm and dry.  Nursing note and vitals reviewed.   ED Course  Procedures (including critical care time) Labs Review Labs Reviewed  WET PREP, GENITAL - Abnormal; Notable for the following:    WBC, Wet Prep HPF POC FEW (*)    All other components within normal limits  CBC WITH DIFFERENTIAL/PLATELET - Abnormal; Notable for the following:    Platelets 409 (*)    All other components within normal limits  I-STAT BETA HCG BLOOD, ED ([facility], [facility], [facility] ONLY) - Abnormal; Notable for the following:    I-stat hCG, quantitative 1598.1 (*)    All other components within normal limits  BASIC METABOLIC PANEL  RH IG WORKUP (INCLUDES ABO/RH)  GC/CHLAMYDIA PROBE AMP ([place]) NOT AT [facility]    Imaging Review [place] Ob Comp Less 14  Wks  03/11/2016  CLINICAL DATA:  Left pelvic pain for 2 weeks.  Vaginal bleeding. EXAM: OBSTETRIC <14 WK US AND TRANSVAGINAL OB US TECHNIQUE: Both transabdominal and transvaginal ultrasound examinations were performed for complete evaluation of the gestation as well as the maternal uterus, adnexal regions, and pelvic cul-de-sac. Transvaginal technique was performed to assess early pregnancy. COMPARISON:  None. FINDINGS: Intrauterine gestational sac: Visualized Yolk sac:  Not visualized Embryo:  Not visualized Cardiac Activity: Not visualized Heart Rate:   bpm MSD: 4  mm   5 w   1  d CRL:    mm    w    d                  US EDC: Subchorionic hemorrhage:  None visualized. Maternal uterus/adnexae: No adnexal masses. Small amount of free fluid. Probable left corpus luteal cyst. IMPRESSION: Five week 1 day intrauterine pregnancy by mean sac diameter. No yolk sac or fetal pole currently. This could be followed with repeat ultrasound in 2 weeks to ensure expected progression. Electronically Signed   By: Charlett NoseKevin  Dover M.D.   On: 03/11/2016 11:15   [place] Ob Transvaginal  03/11/2016  CLINICAL DATA:  Left pelvic pain for 2 weeks.  Vaginal bleeding. EXAM: OBSTETRIC <14 WK US AND TRANSVAGINAL OB US TECHNIQUE: Both transabdominal and transvaginal ultrasound examinations were performed for complete evaluation of the gestation as well as the maternal uterus, adnexal regions, and pelvic cul-de-sac. Transvaginal technique was performed to assess early pregnancy. COMPARISON:  None. FINDINGS: Intrauterine gestational sac: Visualized Yolk sac:  Not visualized Embryo:  Not visualized Cardiac Activity: Not visualized Heart Rate:   bpm MSD: 4  mm   5 w   1  d CRL:    mm    w    d                  US EDC: Subchorionic hemorrhage:  None visualized. Maternal uterus/adnexae: No adnexal masses. Small amount of free fluid. Probable left corpus luteal cyst. IMPRESSION: Five week 1 day intrauterine pregnancy by mean sac diameter. No yolk sac or  fetal pole currently. This could be followed with repeat ultrasound in 2 weeks to ensure expected progression. Electronically Signed   By: Charlett NoseKevin  Dover M.D.   On: 03/11/2016 11:15   I have personally reviewed and evaluated these images and lab results as part of my medical decision-making.   EKG Interpretation None      MDM   Final diagnoses:  First trimester pregnancy  Threatened abortion    Pt comes in with cc of vaginal bleeding.  DDx includes: Ectopic pregnancy Threatened abortion Inevitable abortion Miscarriage Spontaneous abortion Trauma/cerval laceration  Pt's hCG is +. US shows pt has IUP, 5  weeks and 1 day. Results discussed. Pt is surprised. Abd exam is not peritoneal.  Pt was Rh negative last time - so we will give her Rhogam.  Outpatient options provided.  Pt is on seroquel - discussed the class C grading. Advised to stop the meds and seeing her psych doctor.      Derwood Kaplan, MD 03/11/16 1300  Derwood Kaplan, MD 03/11/16 1435

## 2016-03-11 NOTE — ED Notes (Addendum)
Pt c/o vaginal bleeding and lower left abdominal pain. Pt has a history ovarian cysts and states it feels similar to the ones she has had before. States she has gone through approx 16 pads in the last week.

## 2016-03-11 NOTE — Discharge Instructions (Signed)
The guidelines for seroquel are as following: "Until more information is available, administer quetiapine during pregnancy only if the potential benefit to the mother justifies the potential risk to the fetus." So we recommend to stop seroquel and have your behavioral health doctor see you for further medical management.  Return to the ER if the symptoms get worse.  We [facility] and [facility] numbers.  Vaginal Bleeding During Pregnancy, First Trimester A small amount of bleeding (spotting) from the vagina is relatively common in early pregnancy. It usually stops on its own. Various things may cause bleeding or spotting in early pregnancy. Some bleeding may be related to the pregnancy, and some may not. In most cases, the bleeding is normal and is not a problem. However, bleeding can also be a sign of something serious. Be sure to tell your health care provider about any vaginal bleeding right away. Some possible causes of vaginal bleeding during the first trimester include:  Infection or inflammation of the cervix.  Growths (polyps) on the cervix.  Miscarriage or threatened miscarriage.  Pregnancy tissue has developed outside of the uterus and in a fallopian tube (tubal pregnancy).  Tiny cysts have developed in the uterus instead of pregnancy tissue (molar pregnancy). HOME CARE INSTRUCTIONS  Watch your condition for any changes. The following actions may help to lessen any discomfort you are feeling:  Follow your health care provider's instructions for limiting your activity. If your health care provider orders bed rest, you may need to stay in bed and only get up to use the bathroom. However, your health care provider may allow you to continue light activity.  If needed, make plans for someone to help with your regular activities and responsibilities while you are on bed rest.  Keep track of the number of pads you use each day, how often you change pads, and  how soaked (saturated) they are. Write this down.  Do not use tampons. Do not douche.  Do not have sexual intercourse or orgasms until approved by your health care provider.  If you pass any tissue from your vagina, save the tissue so you can show it to your health care provider.  Only take over-the-counter or prescription medicines as directed by your health care provider.  Do not take aspirin because it can make you bleed.  Keep all follow-up appointments as directed by your health care provider. SEEK MEDICAL CARE IF:  You have any vaginal bleeding during any part of your pregnancy.  You have cramps or labor pains.  You have a fever, not controlled by medicine. SEEK IMMEDIATE MEDICAL CARE IF:   You have severe cramps in your back or belly (abdomen).  You pass large clots or tissue from your vagina.  Your bleeding increases.  You feel light-headed or weak, or you have fainting episodes.  You have chills.  You are leaking fluid or have a gush of fluid from your vagina.  You pass out while having a bowel movement. MAKE SURE YOU:  Understand these instructions.  Will watch your condition.  Will get help right away if you are not doing well or get worse.   This information is not intended to replace advice given to you by your health care provider. Make sure you discuss any questions you have with your health care provider.   Document Released: 07/05/2005 Document Revised: 09/30/2013 Document Reviewed: 06/02/2013 Elsevier Interactive Patient Education [employer]2016 [employer]

## 2016-03-11 NOTE — ED Notes (Signed)
Patient not bleeding much now.  Patient reports pain in her left lower pelvic area she rates as a 10.  She took Tylenol last night before bed but has not taken any pain relievers this morning.  She has been out of work for a week  [facility] and care and is up to date.

## 2016-03-11 NOTE — ED Notes (Signed)
Patient transported to Ultrasound 

## 2016-03-11 NOTE — ED Notes (Signed)
Patient transported to US 

## 2016-03-11 NOTE — ED Notes (Signed)
Sent pink top tube down to lab.

## 2016-03-13 LAB — RH IG WORKUP (INCLUDES ABO/RH)
ABO/RH(D): O NEG
Antibody Screen: NEGATIVE
Gestational Age(Wks): 3
Unit division: 0

## 2016-03-13 LAB — GC/CHLAMYDIA PROBE AMP (~~LOC~~) NOT AT ARMC
Chlamydia: NEGATIVE
NEISSERIA GONORRHEA: NEGATIVE

## 2016-08-14 ENCOUNTER — Encounter (HOSPITAL_COMMUNITY): Payer: Self-pay | Admitting: Family Medicine

## 2016-08-14 ENCOUNTER — Emergency Department (HOSPITAL_COMMUNITY)
Admission: EM | Admit: 2016-08-14 | Discharge: 2016-08-15 | Disposition: A | Discharge status: Home / Self Care | Payer: Self-pay | Attending: Emergency Medicine | Admitting: Emergency Medicine

## 2016-08-14 DIAGNOSIS — R1032 Left lower quadrant pain: Secondary | ICD-10-CM | POA: Insufficient documentation

## 2016-08-14 DIAGNOSIS — Z79899 Other long term (current) drug therapy: Secondary | ICD-10-CM | POA: Insufficient documentation

## 2016-08-14 DIAGNOSIS — B9689 Other specified bacterial agents as the cause of diseases classified elsewhere: Secondary | ICD-10-CM | POA: Insufficient documentation

## 2016-08-14 DIAGNOSIS — N760 Acute vaginitis: Secondary | ICD-10-CM | POA: Insufficient documentation

## 2016-08-14 DIAGNOSIS — F17210 Nicotine dependence, cigarettes, uncomplicated: Secondary | ICD-10-CM | POA: Insufficient documentation

## 2016-08-14 DIAGNOSIS — I10 Essential (primary) hypertension: Secondary | ICD-10-CM | POA: Insufficient documentation

## 2016-08-14 HISTORY — DX: Essential (primary) hypertension: I10

## 2016-08-14 LAB — CBC
HEMATOCRIT: 39.4 % (ref 36.0–46.0)
Hemoglobin: 13.1 g/dL (ref 12.0–15.0)
MCH: 29.7 pg (ref 26.0–34.0)
MCHC: 33.2 g/dL (ref 30.0–36.0)
MCV: 89.3 fL (ref 78.0–100.0)
PLATELETS: 404 10*3/uL — AB (ref 150–400)
RBC: 4.41 MIL/uL (ref 3.87–5.11)
RDW: 12.9 % (ref 11.5–15.5)
WBC: 9.2 10*3/uL (ref 4.0–10.5)

## 2016-08-14 LAB — URINALYSIS, ROUTINE W REFLEX MICROSCOPIC
Bilirubin Urine: NEGATIVE
GLUCOSE, UA: NEGATIVE mg/dL
Hgb urine dipstick: NEGATIVE
KETONES UR: NEGATIVE mg/dL
LEUKOCYTES UA: NEGATIVE
NITRITE: NEGATIVE
PH: 6.5 (ref 5.0–8.0)
PROTEIN: NEGATIVE mg/dL
Specific Gravity, Urine: 1.035 — ABNORMAL HIGH (ref 1.005–1.030)

## 2016-08-14 LAB — COMPREHENSIVE METABOLIC PANEL
ALT: 13 U/L — ABNORMAL LOW (ref 14–54)
ANION GAP: 9 (ref 5–15)
AST: 20 U/L (ref 15–41)
Albumin: 4.3 g/dL (ref 3.5–5.0)
Alkaline Phosphatase: 77 U/L (ref 38–126)
BILIRUBIN TOTAL: 0.5 mg/dL (ref 0.3–1.2)
BUN: 9 mg/dL (ref 6–20)
CHLORIDE: 105 mmol/L (ref 101–111)
CO2: 26 mmol/L (ref 22–32)
Calcium: 9.6 mg/dL (ref 8.9–10.3)
Creatinine, Ser: 1.08 mg/dL — ABNORMAL HIGH (ref 0.44–1.00)
Glucose, Bld: 85 mg/dL (ref 65–99)
POTASSIUM: 3.9 mmol/L (ref 3.5–5.1)
Sodium: 140 mmol/L (ref 135–145)
TOTAL PROTEIN: 7.8 g/dL (ref 6.5–8.1)

## 2016-08-14 LAB — LIPASE, BLOOD: LIPASE: 23 U/L (ref 11–51)

## 2016-08-14 LAB — POC URINE PREG, ED: Preg Test, Ur: NEGATIVE

## 2016-08-14 NOTE — ED Triage Notes (Signed)
Patient reports since Friday, she has been experiencing left lower quad pain with bloating, constipation, and loss of appetite. Pt reports she has a history of ovarian cyst and concerned. No vaginal bleeding noted.

## 2016-08-15 ENCOUNTER — Emergency Department (HOSPITAL_COMMUNITY): Payer: Self-pay

## 2016-08-15 LAB — WET PREP, GENITAL
SPERM: NONE SEEN
Trich, Wet Prep: NONE SEEN
Yeast Wet Prep HPF POC: NONE SEEN

## 2016-08-15 LAB — GC/CHLAMYDIA PROBE AMP (~~LOC~~) NOT AT ARMC
CHLAMYDIA, DNA PROBE: NEGATIVE
NEISSERIA GONORRHEA: NEGATIVE

## 2016-08-15 MED ORDER — METRONIDAZOLE 500 MG PO TABS
500.0000 mg | ORAL_TABLET | Freq: Once | ORAL | Status: AC
  Administered 2016-08-15: 500 mg via ORAL
  Filled 2016-08-15: qty 1

## 2016-08-15 MED ORDER — DICYCLOMINE HCL 10 MG PO CAPS
10.0000 mg | ORAL_CAPSULE | Freq: Once | ORAL | Status: AC
  Administered 2016-08-15: 10 mg via ORAL
  Filled 2016-08-15: qty 1

## 2016-08-15 MED ORDER — METRONIDAZOLE 500 MG PO TABS: 500.0000 mg | ORAL_TABLET | Freq: Two times a day (BID) | ORAL | 0 refills | Status: DC

## 2016-08-15 MED ORDER — FENTANYL CITRATE (PF) 100 MCG/2ML IJ SOLN
50.0000 ug | Freq: Once | INTRAMUSCULAR | Status: AC
  Administered 2016-08-15: 50 ug via INTRAVENOUS
  Filled 2016-08-15: qty 2

## 2016-08-15 MED ORDER — KETOROLAC TROMETHAMINE 30 MG/ML IJ SOLN
30.0000 mg | Freq: Once | INTRAMUSCULAR | Status: AC
  Administered 2016-08-15: 30 mg via INTRAVENOUS
  Filled 2016-08-15: qty 1

## 2016-08-15 MED ORDER — SODIUM CHLORIDE 0.9 % IV BOLUS (SEPSIS)
1000.0000 mL | Freq: Once | INTRAVENOUS | Status: AC
  Administered 2016-08-15: 1000 mL via INTRAVENOUS

## 2016-08-15 NOTE — ED Provider Notes (Signed)
[facility]-EMERGENCY DEPT Provider Note   CSN: [id number] Arrival date & time: 08/14/16  2000 By signing my name below, I, Olivia [name], attest that this documentation has been prepared under the direction and in the presence of non-physician practitioner, Arvilla Meres. PA-C Electronically Signed: Levon [name], Scribe. 08/15/2016. 1:49 AM  History   Chief Complaint Chief Complaint  Patient presents with  . Abdominal Pain    HPI Olivia [name] is a 30 y.o. female with hx of ovarian cyst who presents to the Emergency Department complaining of gradually worsening, intermittent LLQ pain onset three days ago. She describes the pain as pressure. Per pt, the pain is brought on by stress or anger and is alleviated by rest. She states she originally thought this was an ovarian cyst but was concerned when it lasted longer than usual. She notes associated chills, subjective fever and constipation. Pt's last bowel movement was 4 days ago. No recent trauma to her abdomen. She endorses one sexual partner in the last 6 months; they are using condoms regularly, but not every time. LNMP 07/24/16. She is not followed by ob gyn or PCP. No h/o abdominal surgeries. Pt denies nausea, vomiting, dysuria, hematuria, vaginal pain, bleeding or discharge, SOB, CP, LOC, rash.   The history is provided by the patient. No language interpreter was used.   Past Medical History:  Diagnosis Date  . Hypertension   . Migraine   . Ovarian cyst     Patient Active Problem List   Diagnosis Date Noted  . Pregnancy 12/03/2014  . Smoker 12/03/2014    Past Surgical History:  Procedure Laterality Date  . WISDOM TOOTH EXTRACTION      OB History    Gravida Para Term Preterm AB Living   1         0   SAB TAB Ectopic Multiple Live Births                  Home Medications    Prior to Admission medications   Medication Sig Start Date End Date Taking? Authorizing Provider  ibuprofen (ADVIL,MOTRIN) 200 MG tablet Take 400  mg by mouth every 6 (six) hours as needed for moderate pain.    Yes Historical Provider, MD  QUEtiapine (SEROQUEL XR) 300 MG 24 hr tablet Take 300 mg by mouth at bedtime.   Yes Historical Provider, MD  QUEtiapine (SEROQUEL) 25 MG tablet Take 25 mg by mouth every morning.    Yes Historical Provider, MD  hydrochlorothiazide (HYDRODIURIL) 25 MG tablet Take 1 tablet (25 mg total) by mouth daily. 08/16/16   Elson Areas, PA-C  metroNIDAZOLE (FLAGYL) 500 MG tablet Take 1 tablet (500 mg total) by mouth 2 (two) times daily. 08/15/16   Lona Kettle, PA-C    Family History Family History  Problem Relation Age of Onset  . Hypertension Mother   . Migraines Mother   . Hypertension Father   . Cancer Father     prostate    Social History Social History  Substance Use Topics  . Smoking status: Current Every Day Smoker    Packs/day: 0.50    Types: Cigarettes  . Smokeless tobacco: Never Used  . Alcohol use No    Allergies   Patient has no known allergies.  Review of Systems Review of Systems  Constitutional: Positive for chills and fever ( subjective). Negative for appetite change and diaphoresis.  HENT: Negative for trouble swallowing.   Eyes: Negative for visual disturbance.  Respiratory: Negative for  shortness of breath.   Cardiovascular: Negative for chest pain.  Gastrointestinal: Positive for abdominal pain and constipation. Negative for blood in stool, diarrhea, nausea and vomiting.  Genitourinary: Negative for dysuria, hematuria, pelvic pain, vaginal bleeding, vaginal discharge and vaginal pain.  Musculoskeletal: Negative for neck pain.  Skin: Negative for rash.  Neurological: Negative for headaches.   Physical Exam Updated Vital Signs BP (!) 121/107   Pulse 80   Temp 98.3 F (36.8 C) (Oral)   Resp 17   Wt 83 kg   LMP 07/24/2016   SpO2 100%   BMI 34.58 kg/m   Physical Exam  Constitutional: She appears well-developed and well-nourished. No distress.  HENT:  Head:  Normocephalic and atraumatic.  Mouth/Throat: Oropharynx is clear and moist. No oropharyngeal exudate.  Eyes: Conjunctivae and EOM are normal. Pupils are equal, round, and reactive to light. Right eye exhibits no discharge. Left eye exhibits no discharge. No scleral icterus.  Neck: Normal range of motion and phonation normal. Neck supple. No neck rigidity. Normal range of motion present.  Cardiovascular: Normal rate, regular rhythm, normal heart sounds and intact distal pulses.   No murmur heard. Pulmonary/Chest: Effort normal and breath sounds normal. No stridor. No respiratory distress. She has no wheezes. She has no rales.  Abdominal: Soft. Bowel sounds are normal. She exhibits no distension. There is tenderness (LLQ). There is no rigidity, no rebound, no guarding, no CVA tenderness, no tenderness at McBurney's point and negative Murphy's sign.  Genitourinary: Vagina normal. Pelvic exam was performed with patient supine. Uterus is not tender. Cervix exhibits discharge. Cervix exhibits no motion tenderness and no friability. Right adnexum displays no tenderness. Left adnexum displays no tenderness.  Genitourinary Comments: Chaperone present for duration of exam. External anatomy normal - no injury, lesions, masses, or rashes. No bleeding, lesions, masses, or ulcerations in vaginal cavity. Cervix is closed with scant clear/white discharge. No friability. No CMT, no adnexal tenderness, no masses palpated on bimanual exam.    Musculoskeletal: Normal range of motion.  Lymphadenopathy:    She has no cervical adenopathy.  Neurological: She is alert. She is not disoriented. Coordination and gait normal. GCS eye subscore is 4. GCS verbal subscore is 5. GCS motor subscore is 6.  Skin: Skin is warm and dry. She is not diaphoretic.  Psychiatric: She has a normal mood and affect. Her behavior is normal.   ED Treatments / Results   DIAGNOSTIC STUDIES:  Oxygen Saturation is 98% on RA, normal by my  interpretation.    COORDINATION OF CARE:  1:43 AM Will order DG abdomen chest. Discussed treatment plan with pt at bedside and pt agreed to plan.  Labs (all labs ordered are listed, but only abnormal results are displayed) Labs Reviewed  WET PREP, GENITAL - Abnormal; Notable for the following:       Result Value   Clue Cells Wet Prep HPF POC PRESENT (*)    WBC, Wet Prep HPF POC FEW (*)    All other components within normal limits  COMPREHENSIVE METABOLIC PANEL - Abnormal; Notable for the following:    Creatinine, Ser 1.08 (*)    ALT 13 (*)    All other components within normal limits  CBC - Abnormal; Notable for the following:    Platelets 404 (*)    All other components within normal limits  URINALYSIS, ROUTINE W REFLEX MICROSCOPIC (NOT AT [facility]) - Abnormal; Notable for the following:    APPearance CLOUDY (*)    Specific Gravity, Urine 1.035 (*)  All other components within normal limits  LIPASE, BLOOD  POC URINE PREG, ED  GC/CHLAMYDIA PROBE AMP ([place]) NOT AT [facility]    EKG  EKG Interpretation None       Radiology   Procedures Procedures (including critical care time)  Medications Ordered in ED Medications  sodium chloride 0.9 % bolus 1,000 mL (0 mLs Intravenous Stopped 08/15/16 0404)  fentaNYL (SUBLIMAZE) injection 50 mcg (50 mcg Intravenous Given 08/15/16 0404)  metroNIDAZOLE (FLAGYL) tablet 500 mg (500 mg Oral Given 08/15/16 0602)  ketorolac (TORADOL) 30 MG/ML injection 30 mg (30 mg Intravenous Given 08/15/16 0605)  dicyclomine (BENTYL) capsule 10 mg (10 mg Oral Given 08/15/16 0602)     Initial Impression / Assessment and Plan / ED Course  I have reviewed the triage vital signs and the nursing notes.  Pertinent labs & imaging results that were available during my care of the patient were reviewed by me and considered in my medical decision making (see chart for details).  Clinical Course as of Aug 17 937  Tue Aug 15, 2016  0300 Normal cardiac  silhouette. No evidence of consolidation, effusion, or PTX. No free air under diaphragm. Some stool noted on x-ray. No dilation of bowel.   DG Abd Acute W/Chest [AM]  0539 On re-evaluation patient endorses slight improvement in pain. Abdomen is soft and non-tender on exam.   [AM]    Clinical Course User Index [AM] Lona KettleAshley Laurel Mikesha Migliaccio, PA-C    Patient presents to ED with complaint of intermittent lower abdominal pain. Patient is afebrile and non-toxic appearing in NAD. She is sleeping in bed. Vital signs remarkable for mildly elevated BP. Abdomen is +BS, soft, and non-distended. Mild TTP of LLQ without guarding, rigidity, or peritoneal signs. Scant clear/white discharge on bimanual exam. No CMT. No adnexal tenderness. IVF and pain medication initiated. Will check basic labs and check abdominal x-ray.  CBC grossly normal. Lipase nml - doubt pancreatitis. Pregnancy test negative - doubt ectopic. LFTs and bili nml, no leukocytosis, afebrile - doubt acute cholecystitis of cholangitis.  Mildly elevated creatinine and U/A has high SG - ?mild dehydration, pt given fluids. No hematuria - doubt nephrolithiasis. No evidence of UTI on U/A, pt denies sxs. Afebrile, no leukocytosis, no focal TTP in RLQ - at this time doubt appendicitis. Wet prep remarkable for BV. Low suspicion for PID - afebrile, no leukocytosis, no CMT. Patient is afebrile and no leukocytosis - low suspicion at this time for diverticulitis. Abdominal x-ray shows no small/large bowel distension, free air, or stone - at this time doubt obstruction or perforation. Scattered gas and stool noted - ?constipation considering last BM on Thursday.   Discussed results and plan with patient. Encouraged hydration, miralax, high fiber diet, and exercise. Rx metronidazole for BV. Tyenol/motrin for pain relief. Follow up with PCP, resources provided. Return precautions given. Patient voiced understanding and is agreeable.   Final Clinical Impressions(s) / ED  Diagnoses   Final diagnoses:  Left lower quadrant pain  Bacterial vaginosis    New Prescriptions Discharge Medication List as of 08/15/2016  6:16 AM    START taking these medications   Details  metroNIDAZOLE (FLAGYL) 500 MG tablet Take 1 tablet (500 mg total) by mouth 2 (two) times daily., Starting Tue 08/15/2016, Print      I personally performed the services described in this documentation, which was scribed in my presence. The recorded information has been reviewed and is accurate.    Lona KettleAshley Laurel Santos [place], [place] JerseyPA-C 08/17/16 [phone]  Zadie Rhine, MD 08/18/16 [phone]

## 2016-08-15 NOTE — Discharge Instructions (Signed)
Read the information below.  Your x-ray shows some stool and gas. Your labs are re-assuring. You have bacterial vaginosis and are being treated with antibiotics.  No evidence of perforation or obstruction.  You can take tylenol 650mg  every 6hrs or motrin 400mg  every 4hrs.  Try taking miralax 1tablespoon in 8 oz of water daily to help with moving your bowels. Drink plenty of fluid.  Be sure to follow up with a primary provider. I have provided the contact information for [facility], please call to establish care.  Use the prescribed medication as directed.  Please discuss all new medications with your pharmacist.   You may return to the Emergency Department at any time for worsening condition or any new symptoms that concern you. Return to ED if you develop fever, nausea/vomiting, blood in stool, worsening or constant abdominal pain, or any other new/concerning symptoms.

## 2016-08-16 ENCOUNTER — Encounter (HOSPITAL_COMMUNITY): Payer: Self-pay | Admitting: Emergency Medicine

## 2016-08-16 ENCOUNTER — Emergency Department (HOSPITAL_COMMUNITY)
Admission: EM | Admit: 2016-08-16 | Discharge: 2016-08-16 | Disposition: A | Discharge status: Home / Self Care | Payer: Self-pay | Attending: Emergency Medicine | Admitting: Emergency Medicine

## 2016-08-16 ENCOUNTER — Emergency Department (HOSPITAL_COMMUNITY): Payer: Self-pay

## 2016-08-16 DIAGNOSIS — F17210 Nicotine dependence, cigarettes, uncomplicated: Secondary | ICD-10-CM | POA: Insufficient documentation

## 2016-08-16 DIAGNOSIS — R51 Headache: Secondary | ICD-10-CM | POA: Insufficient documentation

## 2016-08-16 DIAGNOSIS — Z79899 Other long term (current) drug therapy: Secondary | ICD-10-CM | POA: Insufficient documentation

## 2016-08-16 DIAGNOSIS — I10 Essential (primary) hypertension: Secondary | ICD-10-CM | POA: Insufficient documentation

## 2016-08-16 LAB — COMPREHENSIVE METABOLIC PANEL
ALBUMIN: 3.8 g/dL (ref 3.5–5.0)
ALT: 12 U/L — AB (ref 14–54)
AST: 23 U/L (ref 15–41)
Alkaline Phosphatase: 64 U/L (ref 38–126)
Anion gap: 8 (ref 5–15)
BUN: 10 mg/dL (ref 6–20)
CHLORIDE: 108 mmol/L (ref 101–111)
CO2: 23 mmol/L (ref 22–32)
CREATININE: 1.01 mg/dL — AB (ref 0.44–1.00)
Calcium: 9.6 mg/dL (ref 8.9–10.3)
GFR calc Af Amer: >60 mL/min (ref >60)
GFR calc non Af Amer: >60 mL/min (ref >60)
GLUCOSE: 72 mg/dL (ref 65–99)
Potassium: 4.2 mmol/L (ref 3.5–5.1)
SODIUM: 139 mmol/L (ref 135–145)
Total Bilirubin: 0.7 mg/dL (ref 0.3–1.2)
Total Protein: 6.5 g/dL (ref 6.5–8.1)

## 2016-08-16 LAB — CBC WITH DIFFERENTIAL/PLATELET
BASOS ABS: 0 10*3/uL (ref 0.0–0.1)
BASOS PCT: 0 %
EOS PCT: 2 %
Eosinophils Absolute: 0.2 10*3/uL (ref 0.0–0.7)
HCT: 38 % (ref 36.0–46.0)
Hemoglobin: 12.7 g/dL (ref 12.0–15.0)
LYMPHS PCT: 45 %
Lymphs Abs: 3.1 10*3/uL (ref 0.7–4.0)
MCH: 29.7 pg (ref 26.0–34.0)
MCHC: 33.4 g/dL (ref 30.0–36.0)
MCV: 88.8 fL (ref 78.0–100.0)
Monocytes Absolute: 0.4 10*3/uL (ref 0.1–1.0)
Monocytes Relative: 6 %
Neutro Abs: 3.3 10*3/uL (ref 1.7–7.7)
Neutrophils Relative %: 47 %
PLATELETS: 371 10*3/uL (ref 150–400)
RBC: 4.28 MIL/uL (ref 3.87–5.11)
RDW: 13.1 % (ref 11.5–15.5)
WBC: 7 10*3/uL (ref 4.0–10.5)

## 2016-08-16 LAB — TROPONIN I: Troponin I: <0.03 ng/mL (ref <0.03)

## 2016-08-16 MED ORDER — HYDROCHLOROTHIAZIDE 25 MG PO TABS: 25.0000 mg | ORAL_TABLET | Freq: Every day | ORAL | 2 refills | Status: DC

## 2016-08-16 NOTE — Discharge Instructions (Signed)
Schedule appointment with a primary care MD to schedule recheck of blood pressure

## 2016-08-16 NOTE — ED Provider Notes (Signed)
[facility]-EMERGENCY DEPT Provider Note   CSN: [id number] Arrival date & time: 08/16/16  [id number]     History   Chief Complaint Chief Complaint  Patient presents with  . Dizziness  . Nausea  . Headache    HPI Olivia [name] is a 30 y.o. female.  The history is provided by the patient. No language interpreter was used.  Headache    Dizziness  Quality:  Lightheadedness Severity:  Mild Onset quality:  Gradual Duration:  2 days Timing:  Constant Chronicity:  New Relieved by:  Nothing Worsened by:  Nothing Ineffective treatments:  None tried Risk factors: no anemia   Pt was seen 2 days ago for abdominal pain .  Pt reports she is concerned because her blood pressure has been high since visit.  Pt decided to come in to make sure her heart is okay  Past Medical History:  Diagnosis Date  . Hypertension   . Migraine   . Ovarian cyst     Patient Active Problem List   Diagnosis Date Noted  . Pregnancy 12/03/2014  . Smoker 12/03/2014    Past Surgical History:  Procedure Laterality Date  . WISDOM TOOTH EXTRACTION      OB History    Gravida Para Term Preterm AB Living   1         0   SAB TAB Ectopic Multiple Live Births                   Home Medications    Prior to Admission medications   Medication Sig Start Date End Date Taking? Authorizing Provider  ibuprofen (ADVIL,MOTRIN) 200 MG tablet Take 400 mg by mouth every 6 (six) hours as needed for moderate pain.    Yes Historical Provider, MD  metroNIDAZOLE (FLAGYL) 500 MG tablet Take 1 tablet (500 mg total) by mouth 2 (two) times daily. 08/15/16  Yes Lona KettleAshley Laurel Meyer, PA-C  QUEtiapine (SEROQUEL XR) 300 MG 24 hr tablet Take 300 mg by mouth at bedtime.   Yes Historical Provider, MD  QUEtiapine (SEROQUEL) 25 MG tablet Take 25 mg by mouth every morning.    Yes Historical Provider, MD    Family History Family History  Problem Relation Age of Onset  . Hypertension Mother   . Migraines Mother   . Hypertension Father     . Cancer Father     prostate    Social History Social History  Substance Use Topics  . Smoking status: Current Every Day Smoker    Packs/day: 0.50    Types: Cigarettes  . Smokeless tobacco: Never Used  . Alcohol use No     Allergies   Patient has no known allergies.   Review of Systems Review of Systems  Neurological: Positive for dizziness.  All other systems reviewed and are negative.    Physical Exam Updated Vital Signs BP 136/96   Pulse 80   Temp 97.7 F (36.5 C) (Oral)   Resp 16   Ht 5\' 7"  (1.702 m)   Wt 81.6 kg   LMP 07/24/2016   SpO2 100%   BMI 28.19 kg/m   Physical Exam  Constitutional: She is oriented to person, place, and time. She appears well-developed and well-nourished.  HENT:  Head: Normocephalic.  Eyes: EOM are normal.  Neck: Normal range of motion.  Cardiovascular: Normal rate.   Pulmonary/Chest: Effort normal.  Abdominal: She exhibits no distension.  Musculoskeletal: Normal range of motion.  Neurological: She is alert and oriented to person, place,  and time.  Skin: Skin is warm.  Psychiatric: She has a normal mood and affect.  Nursing note and vitals reviewed.    ED Treatments / Results  Labs (all labs ordered are listed, but only abnormal results are displayed) Labs Reviewed  COMPREHENSIVE METABOLIC PANEL - Abnormal; Notable for the following:       Result Value   Creatinine, Ser 1.01 (*)    ALT 12 (*)    All other components within normal limits  CBC WITH DIFFERENTIAL/PLATELET  TROPONIN I    EKG  EKG Interpretation None       Radiology Dg Chest 2 View  Result Date: 08/16/2016 CLINICAL DATA:  Hypertension today EXAM: CHEST  2 VIEW COMPARISON:  Yesterday FINDINGS: Normal heart size. Lungs clear. No pneumothorax. No pleural effusion. IMPRESSION: No active cardiopulmonary disease. Electronically Signed   By: Jolaine ClickArthur  Hoss M.D.   On: 08/16/2016 09:50   Dg Abd Acute W/chest  Result Date: 08/15/2016 CLINICAL DATA:  Left  lower quadrant pain, bloating, constipation, and loss of appetite for 3 days. EXAM: DG ABDOMEN ACUTE W/ 1V CHEST COMPARISON:  Chest 08/12/2008 FINDINGS: Normal heart size and pulmonary vascularity. No focal airspace disease or consolidation in the lungs. No blunting of costophrenic angles. No pneumothorax. Mediastinal contours appear intact. Scattered gas and stool in the colon. No small or large bowel distention. No free intra-abdominal air. No abnormal air-fluid levels. No radiopaque stones. Visualized bones appear intact. Metallic piercing over the mid abdomen. IMPRESSION: No evidence of active pulmonary disease. Normal nonobstructive bowel gas pattern. Electronically Signed   By: Burman NievesWilliam  Stevens M.D.   On: 08/15/2016 02:43    Procedures Procedures (including critical care time)  Medications Ordered in ED Medications - No data to display   Initial Impression / Assessment and Plan / ED Course  I have reviewed the triage vital signs and the nursing notes.  Pertinent labs & imaging results that were available during my care of the patient were reviewed by me and considered in my medical decision making (see chart for details).  Clinical Course     Pt counseled on results.  I will start hctz on pt.  Pt advised to obtain a primary Md for further evaluation  Final Clinical Impressions(s) / ED Diagnoses   Final diagnoses:  Hypertension, unspecified type    New Prescriptions Discharge Medication List as of 08/16/2016 11:01 AM    START taking these medications   Details  hydrochlorothiazide (HYDRODIURIL) 25 MG tablet Take 1 tablet (25 mg total) by mouth daily., Starting Wed 08/16/2016, Print         Lonia SkinnerLeslie K BartlettSofia, PA-C 08/16/16 1446    Benjiman CoreNathan Pickering, MD 08/16/16 1538

## 2016-08-16 NOTE — ED Notes (Signed)
Patient transported to X-ray 

## 2016-08-16 NOTE — ED Triage Notes (Addendum)
Pt in with c/o dizziness, HA, nausea since working out this morning. Pt states she walked on treadmill for 30 min and s/s started afterwards. Recently dx with HTN, ate breakfast this morning, smokes 1/2PPD. A&Ox4, BP 153/111

## 2016-09-06 ENCOUNTER — Emergency Department (HOSPITAL_COMMUNITY)
Admission: EM | Admit: 2016-09-06 | Discharge: 2016-09-06 | Disposition: A | Discharge status: Home / Self Care | Payer: Self-pay | Attending: Emergency Medicine | Admitting: Emergency Medicine

## 2016-09-06 ENCOUNTER — Encounter (HOSPITAL_COMMUNITY): Payer: Self-pay | Admitting: *Deleted

## 2016-09-06 DIAGNOSIS — A084 Viral intestinal infection, unspecified: Secondary | ICD-10-CM | POA: Insufficient documentation

## 2016-09-06 DIAGNOSIS — F17210 Nicotine dependence, cigarettes, uncomplicated: Secondary | ICD-10-CM | POA: Insufficient documentation

## 2016-09-06 DIAGNOSIS — I10 Essential (primary) hypertension: Secondary | ICD-10-CM | POA: Insufficient documentation

## 2016-09-06 LAB — I-STAT BETA HCG BLOOD, ED (MC, WL, AP ONLY)

## 2016-09-06 LAB — COMPREHENSIVE METABOLIC PANEL
ALK PHOS: 71 U/L (ref 38–126)
ALT: 16 U/L (ref 14–54)
AST: 19 U/L (ref 15–41)
Albumin: 4 g/dL (ref 3.5–5.0)
Anion gap: 10 (ref 5–15)
BILIRUBIN TOTAL: 0.4 mg/dL (ref 0.3–1.2)
BUN: 7 mg/dL (ref 6–20)
CALCIUM: 9.3 mg/dL (ref 8.9–10.3)
CO2: 25 mmol/L (ref 22–32)
CREATININE: 0.96 mg/dL (ref 0.44–1.00)
Chloride: 103 mmol/L (ref 101–111)
Glucose, Bld: 99 mg/dL (ref 65–99)
Potassium: 3.8 mmol/L (ref 3.5–5.1)
Sodium: 138 mmol/L (ref 135–145)
TOTAL PROTEIN: 6.6 g/dL (ref 6.5–8.1)

## 2016-09-06 LAB — CBC
HCT: 38.3 % (ref 36.0–46.0)
Hemoglobin: 12.8 g/dL (ref 12.0–15.0)
MCH: 29.6 pg (ref 26.0–34.0)
MCHC: 33.4 g/dL (ref 30.0–36.0)
MCV: 88.7 fL (ref 78.0–100.0)
PLATELETS: 403 10*3/uL — AB (ref 150–400)
RBC: 4.32 MIL/uL (ref 3.87–5.11)
RDW: 12.7 % (ref 11.5–15.5)
WBC: 7.4 10*3/uL (ref 4.0–10.5)

## 2016-09-06 LAB — LIPASE, BLOOD: LIPASE: 24 U/L (ref 11–51)

## 2016-09-06 MED ORDER — SODIUM CHLORIDE 0.9 % IV BOLUS (SEPSIS)
1000.0000 mL | Freq: Once | INTRAVENOUS | Status: AC
  Administered 2016-09-06: 1000 mL via INTRAVENOUS

## 2016-09-06 MED ORDER — ONDANSETRON 4 MG PO TBDP: 4.0000 mg | ORAL_TABLET | Freq: Three times a day (TID) | ORAL | 0 refills | Status: DC | PRN

## 2016-09-06 MED ORDER — DICYCLOMINE HCL 20 MG PO TABS: 20.0000 mg | ORAL_TABLET | Freq: Two times a day (BID) | ORAL | 0 refills | Status: DC

## 2016-09-06 MED ORDER — DICYCLOMINE HCL 10 MG PO CAPS
10.0000 mg | ORAL_CAPSULE | Freq: Once | ORAL | Status: AC
  Administered 2016-09-06: 10 mg via ORAL
  Filled 2016-09-06: qty 1

## 2016-09-06 MED ORDER — ONDANSETRON HCL 4 MG/2ML IJ SOLN
4.0000 mg | Freq: Once | INTRAMUSCULAR | Status: AC
  Administered 2016-09-06: 4 mg via INTRAVENOUS
  Filled 2016-09-06: qty 2

## 2016-09-06 MED ORDER — ONDANSETRON 4 MG PO TBDP
4.0000 mg | ORAL_TABLET | Freq: Once | ORAL | Status: AC | PRN
  Administered 2016-09-06: 4 mg via ORAL
  Filled 2016-09-06: qty 1

## 2016-09-06 NOTE — ED Notes (Signed)
PO fluids given and pt. Tolerates well.

## 2016-09-06 NOTE — ED Provider Notes (Signed)
[facility]-EMERGENCY DEPT Provider Note   CSN: [id number] Arrival date & time: 09/06/16  0825     History   Chief Complaint Chief Complaint  Patient presents with  . Abdominal Pain  . Emesis    HPI Olivia [name] is a 30 y.o. female.  HPI   Patient is a 30 year old female with history of hypertension, bipolar disorder and ovarian cysts who presents the ED with complaint of nausea, vomiting and diarrhea, onset 3 days. Patient reports she has had nausea with 3-4 episodes of NBNB vomiting daily and 2-3 episodes of nonbloody diarrhea daily. Endorses associated chills and intermittent aching diffuse abdominal pain. Denies any aggravating or alleviating factors. Denies fever, chest pain, shortness of breath, cough, hematemesis, urinary symptoms, blood in urine or stool, vaginal bleeding, vaginal discharge. Patient denies taking any medications at home for her symptoms. She notes a coworker had similar symptoms earlier this week. She notes she was diagnosed with BV a few weeks ago and was tx with flagyl but denies any other recent antibiotic use. Denies any recent hospitalizations. Denies any recent travel outside the [place], drinking from fresh water sources. Denies hx of abdominal surgeries. She reports she has still been able to drink fluids at home but reports decreased by mouth intake. Patient reports "I feel like I have food poisoning which I have had for in the past". LMP 09/01/16.   Past Medical History:  Diagnosis Date  . Hypertension   . Migraine   . Ovarian cyst     Patient Active Problem List   Diagnosis Date Noted  . Pregnancy 12/03/2014  . Smoker 12/03/2014    Past Surgical History:  Procedure Laterality Date  . WISDOM TOOTH EXTRACTION      OB History    Gravida Para Term Preterm AB Living   1         0   SAB TAB Ectopic Multiple Live Births                   Home Medications    Prior to Admission medications   Medication Sig Start Date End Date Taking?  Authorizing Provider  hydrochlorothiazide (HYDRODIURIL) 25 MG tablet Take 1 tablet (25 mg total) by mouth daily. 08/16/16  Yes Lonia SkinnerLeslie K Sofia, PA-C  QUEtiapine (SEROQUEL XR) 300 MG 24 hr tablet Take 300 mg by mouth at bedtime.   Yes Historical Provider, MD  QUEtiapine (SEROQUEL) 25 MG tablet Take 25 mg by mouth every morning.    Yes Historical Provider, MD  dicyclomine (BENTYL) 20 MG tablet Take 1 tablet (20 mg total) by mouth 2 (two) times daily. 09/06/16   Barrett HenleNicole Elizabeth Nadeau, PA-C  metroNIDAZOLE (FLAGYL) 500 MG tablet Take 1 tablet (500 mg total) by mouth 2 (two) times daily. Patient not taking: Reported on 09/06/2016 08/15/16   Lona KettleAshley Laurel Meyer, PA-C  ondansetron (ZOFRAN ODT) 4 MG disintegrating tablet Take 1 tablet (4 mg total) by mouth every 8 (eight) hours as needed for nausea or vomiting. 09/06/16   Barrett HenleNicole Elizabeth Nadeau, PA-C    Family History Family History  Problem Relation Age of Onset  . Hypertension Mother   . Migraines Mother   . Hypertension Father   . Cancer Father     prostate    Social History Social History  Substance Use Topics  . Smoking status: Current Every Day Smoker    Packs/day: 0.50    Types: Cigarettes  . Smokeless tobacco: Never Used  . Alcohol use No  Allergies   Patient has no known allergies.   Review of Systems Review of Systems  Constitutional: Positive for chills.  Gastrointestinal: Positive for abdominal pain, diarrhea, nausea and vomiting.  All other systems reviewed and are negative.    Physical Exam Updated Vital Signs BP 117/89   Pulse 87   Temp 98.2 F (36.8 C) (Oral)   Resp 16   Ht 5\' 2"  (1.575 m)   Wt 81.6 kg   LMP 08/30/2016   SpO2 100%   BMI 32.92 kg/m   Physical Exam  Constitutional: She is oriented to person, place, and time. She appears well-developed and well-nourished.  HENT:  Head: Normocephalic and atraumatic.  Mouth/Throat: Uvula is midline, oropharynx is clear and moist and mucous membranes  are normal. No oropharyngeal exudate, posterior oropharyngeal edema, posterior oropharyngeal erythema or tonsillar abscesses. No tonsillar exudate.  Eyes: Conjunctivae and EOM are normal. Right eye exhibits no discharge. Left eye exhibits no discharge. No scleral icterus.  Neck: Normal range of motion. Neck supple.  Cardiovascular: Normal rate, regular rhythm, normal heart sounds and intact distal pulses.   Pulmonary/Chest: Effort normal and breath sounds normal. No respiratory distress. She has no wheezes. She has no rales. She exhibits no tenderness.  Abdominal: Soft. Bowel sounds are normal. She exhibits no distension and no mass. There is no tenderness. There is no rebound and no guarding. No hernia.  No CVA tenderness  Musculoskeletal: Normal range of motion. She exhibits no edema.  Neurological: She is alert and oriented to person, place, and time.  Skin: Skin is warm and dry.  Nursing note and vitals reviewed.    ED Treatments / Results  Labs (all labs ordered are listed, but only abnormal results are displayed) Labs Reviewed  CBC - Abnormal; Notable for the following:       Result Value   Platelets 403 (*)    All other components within normal limits  LIPASE, BLOOD  COMPREHENSIVE METABOLIC PANEL  URINALYSIS, ROUTINE W REFLEX MICROSCOPIC (NOT AT [facility])  I-STAT BETA HCG BLOOD, ED ([facility], [facility], [facility] ONLY)    EKG  EKG Interpretation None       Radiology No results found.  Procedures Procedures (including critical care time)  Medications Ordered in ED Medications  ondansetron (ZOFRAN-ODT) disintegrating tablet 4 mg (4 mg Oral Given 09/06/16 0840)  sodium chloride 0.9 % bolus 1,000 mL (1,000 mLs Intravenous New Bag/Given 09/06/16 1119)  ondansetron (ZOFRAN) injection 4 mg (4 mg Intravenous Given 09/06/16 1119)  dicyclomine (BENTYL) capsule 10 mg (10 mg Oral Given 09/06/16 1224)     Initial Impression / Assessment and Plan / ED Course  I have reviewed the triage vital  signs and the nursing notes.  Pertinent labs & imaging results that were available during my care of the patient were reviewed by me and considered in my medical decision making (see chart for details).  Clinical Course     Patient with symptoms consistent with viral gastroenteritis. Reports contact with coworker who had similar symptoms this week. Vitals are stable, no fever.  No signs of dehydration, tolerating PO fluids > 6 oz.  Lungs are clear.  No focal abdominal pain, no concern for appendicitis, cholecystitis, pancreatitis, ruptured viscus, UTI, kidney stone, or any other abdominal etiology.  Supportive therapy indicated with return if symptoms worsen.  Patient counseled.   Final Clinical Impressions(s) / ED Diagnoses   Final diagnoses:  Viral gastroenteritis    New Prescriptions New Prescriptions   DICYCLOMINE (BENTYL) 20 MG  TABLET    Take 1 tablet (20 mg total) by mouth 2 (two) times daily.   ONDANSETRON (ZOFRAN ODT) 4 MG DISINTEGRATING TABLET    Take 1 tablet (4 mg total) by mouth every 8 (eight) hours as needed for nausea or vomiting.     Satira Sarkicole Elizabeth ClioNadeau, [place]PA-C 09/06/16 1258    Jacalyn LefevreJulie Haviland, MD 09/06/16 [phone]

## 2016-09-06 NOTE — Discharge Instructions (Signed)
Take your medications as prescribed as an for abdominal pain and nausea. Continue drinking fluids at home to remain hydrated. I recommend eating a bland diet for the next few days until her symptoms have improved. Please follow up with a primary care provider from the Resource Guide provided below in 4-5 days if your symptoms have not improved. Please return to the Emergency Department if symptoms worsen or new onset of fever, chest pain, difficulty breathing, new/worsening abdominal pain, vomiting, unable to keel fluids down, blood in emesis or stool.

## 2016-09-06 NOTE — ED Triage Notes (Signed)
Pt reports generalized abd pain and n/v/d x 2 days.

## 2016-09-22 ENCOUNTER — Emergency Department (HOSPITAL_COMMUNITY)
Admission: EM | Admit: 2016-09-22 | Discharge: 2016-09-24 | Disposition: A | Discharge status: Home / Self Care | Payer: Self-pay | Attending: Emergency Medicine | Admitting: Emergency Medicine

## 2016-09-22 ENCOUNTER — Encounter (HOSPITAL_COMMUNITY): Payer: Self-pay | Admitting: Emergency Medicine

## 2016-09-22 DIAGNOSIS — Y999 Unspecified external cause status: Secondary | ICD-10-CM | POA: Insufficient documentation

## 2016-09-22 DIAGNOSIS — Y939 Activity, unspecified: Secondary | ICD-10-CM | POA: Insufficient documentation

## 2016-09-22 DIAGNOSIS — F1110 Opioid abuse, uncomplicated: Secondary | ICD-10-CM | POA: Insufficient documentation

## 2016-09-22 DIAGNOSIS — Z79899 Other long term (current) drug therapy: Secondary | ICD-10-CM | POA: Insufficient documentation

## 2016-09-22 DIAGNOSIS — I10 Essential (primary) hypertension: Secondary | ICD-10-CM | POA: Insufficient documentation

## 2016-09-22 DIAGNOSIS — F332 Major depressive disorder, recurrent severe without psychotic features: Secondary | ICD-10-CM | POA: Diagnosis present

## 2016-09-22 DIAGNOSIS — F17210 Nicotine dependence, cigarettes, uncomplicated: Secondary | ICD-10-CM | POA: Insufficient documentation

## 2016-09-22 DIAGNOSIS — T1491XA Suicide attempt, initial encounter: Secondary | ICD-10-CM | POA: Insufficient documentation

## 2016-09-22 DIAGNOSIS — F1410 Cocaine abuse, uncomplicated: Secondary | ICD-10-CM | POA: Insufficient documentation

## 2016-09-22 DIAGNOSIS — R45851 Suicidal ideations: Secondary | ICD-10-CM

## 2016-09-22 DIAGNOSIS — X58XXXA Exposure to other specified factors, initial encounter: Secondary | ICD-10-CM | POA: Insufficient documentation

## 2016-09-22 DIAGNOSIS — Y929 Unspecified place or not applicable: Secondary | ICD-10-CM | POA: Insufficient documentation

## 2016-09-22 HISTORY — DX: Bipolar disorder, unspecified: F31.9

## 2016-09-22 HISTORY — DX: Post-traumatic stress disorder, unspecified: F43.10

## 2016-09-22 HISTORY — DX: Anxiety disorder, unspecified: F41.9

## 2016-09-22 LAB — CBC
HCT: 36.8 % (ref 36.0–46.0)
HEMOGLOBIN: 12.5 g/dL (ref 12.0–15.0)
MCH: 30 pg (ref 26.0–34.0)
MCHC: 34 g/dL (ref 30.0–36.0)
MCV: 88.2 fL (ref 78.0–100.0)
Platelets: 371 10*3/uL (ref 150–400)
RBC: 4.17 MIL/uL (ref 3.87–5.11)
RDW: 13.1 % (ref 11.5–15.5)
WBC: 9.4 10*3/uL (ref 4.0–10.5)

## 2016-09-22 LAB — COMPREHENSIVE METABOLIC PANEL
ALK PHOS: 68 U/L (ref 38–126)
ALT: 18 U/L (ref 14–54)
AST: 20 U/L (ref 15–41)
Albumin: 4.3 g/dL (ref 3.5–5.0)
Anion gap: 8 (ref 5–15)
BUN: 8 mg/dL (ref 6–20)
CALCIUM: 9.2 mg/dL (ref 8.9–10.3)
CHLORIDE: 103 mmol/L (ref 101–111)
CO2: 26 mmol/L (ref 22–32)
CREATININE: 0.91 mg/dL (ref 0.44–1.00)
Glucose, Bld: 85 mg/dL (ref 65–99)
Potassium: 3.6 mmol/L (ref 3.5–5.1)
Sodium: 137 mmol/L (ref 135–145)
Total Bilirubin: 0.5 mg/dL (ref 0.3–1.2)
Total Protein: 7.7 g/dL (ref 6.5–8.1)

## 2016-09-22 LAB — RAPID URINE DRUG SCREEN, HOSP PERFORMED
AMPHETAMINES: NOT DETECTED
BARBITURATES: NOT DETECTED
Benzodiazepines: NOT DETECTED
Cocaine: POSITIVE — AB
Opiates: POSITIVE — AB
TETRAHYDROCANNABINOL: NOT DETECTED

## 2016-09-22 LAB — ACETAMINOPHEN LEVEL: Acetaminophen (Tylenol), Serum: <10 ug/mL — ABNORMAL LOW (ref 10–30)

## 2016-09-22 LAB — ETHANOL

## 2016-09-22 LAB — SALICYLATE LEVEL: SALICYLATE LVL: 7.1 mg/dL (ref 2.8–30.0)

## 2016-09-22 MED ORDER — IBUPROFEN 200 MG PO TABS: 600.0000 mg | ORAL_TABLET | Freq: Three times a day (TID) | ORAL | Status: DC | PRN

## 2016-09-22 MED ORDER — LORAZEPAM 1 MG PO TABS: 1.0000 mg | ORAL_TABLET | Freq: Three times a day (TID) | ORAL | Status: DC | PRN

## 2016-09-22 MED ORDER — ONDANSETRON HCL 4 MG PO TABS: 4.0000 mg | ORAL_TABLET | Freq: Three times a day (TID) | ORAL | Status: DC | PRN

## 2016-09-22 MED ORDER — ALUM & MAG HYDROXIDE-SIMETH 200-200-20 MG/5ML PO SUSP: 30.0000 mL | ORAL | Status: DC | PRN

## 2016-09-22 MED ORDER — ACETAMINOPHEN 325 MG PO TABS
650.0000 mg | ORAL_TABLET | ORAL | Status: DC | PRN
  Administered 2016-09-22: 650 mg via ORAL
  Filled 2016-09-22: qty 2
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
## 2016-09-22 NOTE — ED Notes (Signed)
TTS at the bedside. 

## 2016-09-22 NOTE — ED Notes (Signed)
Pt's mother took pt belongings home.
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
## 2016-09-22 NOTE — ED Notes (Signed)
SBAR Report received from previous nurse. Pt received calm and visible on unit. Pt denies current SI/ HI, A/V H, depression, anxiety, or pain at this time, and appears otherwise stable and free of distress (asleep). Pt reminded of camera surveillance, q 15 min rounds, and rules of the milieu. Will continue to assess.

## 2016-09-22 NOTE — ED Notes (Signed)
Pt verbally approved for her probation officer to come see her in the [facility].

## 2016-09-22 NOTE — BH Assessment (Signed)
[facility] Assessment Progress Note     Case was staffed with Joyce GrossWiithrow DNP who recommended patient be re-evaluated in the a.m.

## 2016-09-22 NOTE — ED Notes (Signed)
Bed: [facility] Expected date:  Expected time:  Means of arrival:  Comments: Hold for triage 4 

## 2016-09-22 NOTE — ED Notes (Signed)
[facility] and reports depression related to the death of her nephew in July and feeling overwhelmed with work and helping her family. Pt's nephew was involved in a triple homicide and pt has been working two jobs. [facility] Merlyn AlbertFred. She reports taking seroquel and another medication for anxiety that she could not remember the name. Pt was given a snack as requested and tylenol for a headache. She currently denies si and hi.

## 2016-09-23 DIAGNOSIS — Z79899 Other long term (current) drug therapy: Secondary | ICD-10-CM

## 2016-09-23 DIAGNOSIS — R45851 Suicidal ideations: Secondary | ICD-10-CM

## 2016-09-23 DIAGNOSIS — F332 Major depressive disorder, recurrent severe without psychotic features: Secondary | ICD-10-CM | POA: Diagnosis present

## 2016-09-23 DIAGNOSIS — Z8042 Family history of malignant neoplasm of prostate: Secondary | ICD-10-CM

## 2016-09-23 DIAGNOSIS — F17210 Nicotine dependence, cigarettes, uncomplicated: Secondary | ICD-10-CM

## 2016-09-23 DIAGNOSIS — Z8249 Family history of ischemic heart disease and other diseases of the circulatory system: Secondary | ICD-10-CM

## 2016-09-23 MED ORDER — FLUOXETINE HCL 10 MG PO CAPS
10.0000 mg | ORAL_CAPSULE | Freq: Every day | ORAL | Status: DC
  Administered 2016-09-23 – 2016-09-24 (×2): 10 mg via ORAL
  Filled 2016-09-23 (×2): qty 1

## 2016-09-23 MED ORDER — QUETIAPINE FUMARATE 300 MG PO TABS
300.0000 mg | ORAL_TABLET | Freq: Every day | ORAL | Status: DC
  Administered 2016-09-23: 300 mg via ORAL
  Filled 2016-09-23: qty 1

## 2016-09-23 NOTE — ED Notes (Signed)
Introduced self to patient. Pt oriented to unit expectations.  Assessed pt for:  A) Anxiety &/or agitation: Pt has been calm and cooperative today, sleeping mostly. She continues to have thoughts of harming herself and is willing to go voluntarily for further treatment in a psychiatric hospital.   S) Safety: Safety maintained with q-15-minute checks and hourly rounds by staff.  A) ADLs: Pt able to perform ADLs independently.  P) Pick-Up (room cleanliness): Pt's room clean and free of clutter.
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
## 2016-09-23 NOTE — ED Provider Notes (Signed)
[facility] Provider Note   CSN: [id number] Arrival date & time: 09/22/16  1236     History   Chief Complaint Chief Complaint  Patient presents with  . Suicidal    HPI Olivia [name] is a 30 y.o. female.   Mental Health Problem  Presenting symptoms: depression, suicidal thoughts, suicidal threats and suicide attempt   Patient accompanied by:  Family member and parent Degree of incapacity (severity):  Moderate Onset quality:  Gradual Duration:  2 months Timing:  Constant Progression:  Worsening Chronicity:  New Context: drug abuse   Context: not noncompliant and not recent medication change   Treatment compliance:  Some of the time Relieved by:  None tried Worsened by:  Nothing Ineffective treatments:  None tried Associated symptoms: no feelings of worthlessness     Past Medical History:  Diagnosis Date  . Anxiety   . Bipolar 1 disorder ([facility])   . Hypertension   . Migraine   . Ovarian cyst   . PTSD (post-traumatic stress disorder)     Patient Active Problem List   Diagnosis Date Noted  . Pregnancy 12/03/2014  . Smoker 12/03/2014    Past Surgical History:  Procedure Laterality Date  . WISDOM TOOTH EXTRACTION      OB History    Gravida Para Term Preterm AB Living   1         0   SAB TAB Ectopic Multiple Live Births                   Home Medications    Prior to Admission medications   Medication Sig Start Date End Date Taking? Authorizing Provider  BuPROPion HCl (WELLBUTRIN PO) Take 1 tablet by mouth daily.   Yes Historical Provider, MD  dicyclomine (BENTYL) 20 MG tablet Take 1 tablet (20 mg total) by mouth 2 (two) times daily. 09/06/16  Yes Barrett HenleNicole Elizabeth Nadeau, PA-C  hydrochlorothiazide (HYDRODIURIL) 25 MG tablet Take 1 tablet (25 mg total) by mouth daily. 08/16/16  Yes Lonia SkinnerLeslie K Sofia, PA-C  QUEtiapine (SEROQUEL XR) 300 MG 24 hr tablet Take 300 mg by mouth at bedtime.   Yes Historical Provider, MD  QUEtiapine (SEROQUEL) 25 MG  tablet Take 25 mg by mouth every morning.    Yes Historical Provider, MD  metroNIDAZOLE (FLAGYL) 500 MG tablet Take 1 tablet (500 mg total) by mouth 2 (two) times daily. Patient not taking: Reported on 09/22/2016 08/15/16   Lona KettleAshley Laurel Meyer, PA-C  ondansetron (ZOFRAN ODT) 4 MG disintegrating tablet Take 1 tablet (4 mg total) by mouth every 8 (eight) hours as needed for nausea or vomiting. Patient not taking: Reported on 09/22/2016 09/06/16   Barrett HenleNicole Elizabeth Nadeau, PA-C    Family History Family History  Problem Relation Age of Onset  . Hypertension Mother   . Migraines Mother   . Hypertension Father   . Cancer Father     prostate    Social History Social History  Substance Use Topics  . Smoking status: Current Every Day Smoker    Packs/day: 0.50    Types: Cigarettes  . Smokeless tobacco: Never Used  . Alcohol use No     Allergies   Patient has no known allergies.   Review of Systems Review of Systems  Psychiatric/Behavioral: Positive for suicidal ideas.  All other systems reviewed and are negative.    Physical Exam Updated Vital Signs BP 132/73 (BP Location: Right Arm)   Pulse 79   Temp 98.6 F (37 C) (  Oral)   Resp 17   LMP 08/30/2016   SpO2 99%   Physical Exam  Constitutional: She is oriented to person, place, and time. She appears well-developed and well-nourished.  HENT:  Head: Normocephalic and atraumatic.  Eyes: Conjunctivae and EOM are normal.  Neck: Normal range of motion.  Cardiovascular: Normal rate and regular rhythm.   Pulmonary/Chest: No stridor. No respiratory distress.  Abdominal: She exhibits no distension.  Musculoskeletal: Normal range of motion. She exhibits no edema or deformity.  Neurological: She is alert and oriented to person, place, and time.  Skin: Skin is warm and dry.  Psychiatric: Labile: with spontaneous crying episodes. She is withdrawn. She does not exhibit a depressed mood. She expresses suicidal ideation. She expresses  suicidal plans.  Nursing note and vitals reviewed.    ED Treatments / Results  Labs (all labs ordered are listed, but only abnormal results are displayed) Labs Reviewed  ACETAMINOPHEN LEVEL - Abnormal; Notable for the following:       Result Value   Acetaminophen (Tylenol), Serum <10 (*)    All other components within normal limits  RAPID URINE DRUG SCREEN, HOSP PERFORMED - Abnormal; Notable for the following:    Opiates POSITIVE (*)    Cocaine POSITIVE (*)    All other components within normal limits  COMPREHENSIVE METABOLIC PANEL  ETHANOL  SALICYLATE LEVEL  CBC    EKG  EKG Interpretation None       Radiology No results found.  Procedures Procedures (including critical care time)  Medications Ordered in ED Medications  LORazepam (ATIVAN) tablet 1 mg (not administered)  acetaminophen (TYLENOL) tablet 650 mg (650 mg Oral Given 09/22/16 1603)  ibuprofen (ADVIL,MOTRIN) tablet 600 mg (not administered)  ondansetron (ZOFRAN) tablet 4 mg (not administered)  alum & mag hydroxide-simeth (MAALOX/MYLANTA) 200-200-20 MG/5ML suspension 30 mL (not administered)     Initial Impression / Assessment and Plan / ED Course  I have reviewed the triage vital signs and the nursing notes.  Pertinent labs & imaging results that were available during my care of the patient were reviewed by me and considered in my medical decision making (see chart for details).  Clinical Course     Suicide attempt by drowning herself in a bathtub. With ongoing ideation and severe depression. Medically cleared for psych evaluation.  Not IVC'ed, but would need it if she tried to leave.   Final Clinical Impressions(s) / ED Diagnoses   Final diagnoses:  Suicidal ideation  Suicidal thoughts  Suicide attempt    New Prescriptions New Prescriptions   No medications on file     Marily MemosJason Mignonne Afonso, MD 09/23/16 1008

## 2016-09-23 NOTE — Progress Notes (Signed)
CSW contacted the following facilities to inquire about bed availability: [facility], [address], [address] [facility], [facility], [name], [place], [facility] [address], [facility], [address], [address], [facility], [facility], [facility], [facility], [facility] and [facility]    [facility] following facilities reported being at capacity: [facility], [facility], [facility], [facility] and [facility]   The following facilities reported bed availability: [facility], [facility], [facility], [facility] [address], [address], [facility], [facility] (3 [facility] - low acuity) and [facility].   The following facilities did not answer: [facility], [facility] and [facility].  CSW faxed patient to the following facilities: [facility], [facility], [facility], [facility] [address], [address], [facility], [facility], [facility], [address], [facility] and [facility].

## 2016-09-23 NOTE — ED Notes (Signed)
SBAR Report received from previous nurse. Pt received calm and visible on unit. Pt denies current SI/ HI, A/V H, depression, anxiety, or pain at this time, and appears otherwise stable and free of distress. Pt reminded of camera surveillance, q 15 min rounds, and rules of the milieu. Will continue to assess. 

## 2016-09-24 MED ORDER — QUETIAPINE FUMARATE 300 MG PO TABS: 300.0000 mg | ORAL_TABLET | Freq: Every day | ORAL | 0 refills | Status: DC

## 2016-09-24 MED ORDER — FLUOXETINE HCL 10 MG PO CAPS: 10.0000 mg | ORAL_CAPSULE | Freq: Every day | ORAL | 0 refills | Status: DC

## 2016-09-24 NOTE — [facility] Suicide Risk Assessment (Signed)
[facility]   Principal Problem: MDD (major depressive disorder), recurrent severe, without psychosis ([facility]) Discharge Diagnoses:  Patient Active Problem List   Diagnosis Date Noted  . MDD (major depressive disorder), recurrent severe, without psychosis ([facility]) [F33.2] 09/23/2016    Priority: High  . Pregnancy [Z34.90] 12/03/2014  . Smoker [F17.200] 12/03/2014    Total Time spent with patient: 20 minutes  Musculoskeletal: Strength & Muscle Tone: within normal limits Gait & Station: normal Patient leans: N/A  Psychiatric Specialty Exam:   Blood pressure 108/73, pulse 94, temperature 99.3 F (37.4 C), temperature source Oral, resp. rate 18, last menstrual period 08/30/2016, SpO2 97 %, unknown if currently breastfeeding.There is no height or weight on file to calculate BMI.   General Appearance: Casual and Fairly Groomed  Eye Contact:  Fair  Speech:  Clear and Coherent and Normal Rate  Volume:  Decreased  Mood:  Euthymic  Affect:  Appropriate and Congruent  Thought Process:  Coherent, Goal Directed, Linear and Descriptions of Associations: Intact  Orientation:  Full (Time, Place, and Person)  Thought Content:  Focused on med management and discharge plans  Suicidal Thoughts:  No and contracts for safety.   Homicidal Thoughts:  No  Memory:  Immediate;   Fair Recent;   Fair Remote;   Fair  Judgement:  Fair  Insight:  Fair  Psychomotor Activity:  Normal  Concentration:  Concentration: Fair and Attention Span: Fair  Recall:  [employer]Fair  Fund of Knowledge:  Fair  Language:  Fair  Akathisia:  No  Handed:    AIMS (if indicated):     Assets:  Communication Skills Desire for Improvement Resilience Social Support  ADL's:  Intact  Cognition:  WNL  Sleep:        Mental Status Per Nursing Assessment::   On Admission:     Demographic Factors:  Low socioeconomic status  Loss Factors: family dynamic strain  Historical Factors: Prior suicide attempts  and Impulsivity  Risk Reduction Factors:   Positive social support, Positive therapeutic relationship and Positive coping skills or problem solving skills  Continued Clinical Symptoms:  Depression:   Anhedonia Hopelessness Impulsivity  Cognitive Features That Contribute To Risk:  Closed-mindedness    Suicide Risk:  Mild:  Suicidal ideation of limited frequency, intensity, duration, and specificity.  There are no identifiable plans, no associated intent, mild dysphoria and related symptoms, good self-control (both objective and subjective assessment), few other risk factors, and identifiable protective factors, including available and accessible social support.  Follow-up Information    [facility] Go on 09/25/2016.   Specialty:  Behavioral Health Why:  Hours of operation: Monday - Friday 8:30am - 5pm.  Contact information: [address] [place] [address] [phone]           Plan Of Care/Follow-up recommendations:  Activity:  As tolerated Diet:  Heart healthy with low sodium.  Beau FannyWithrow, Shyam Dawson C, FNP 09/24/2016, 12:26 PM
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
## 2016-09-24 NOTE — Progress Notes (Signed)
CSW spoke with patient at bedside. CSW informed patient that psychiatrist recommends that [facility]. CSW inquired if patient had any additional questions [facility], patient replied no. CSW provided patient with a handout about [facility].

## 2016-11-12 LAB — OB RESULTS CONSOLE GC/CHLAMYDIA: Gonorrhea: NEGATIVE

## 2017-06-20 ENCOUNTER — Other Ambulatory Visit (HOSPITAL_COMMUNITY)
Admission: RE | Admit: 2017-06-20 | Discharge: 2017-06-20 | Disposition: A | Discharge status: Home / Self Care | Payer: Medicaid Other | Source: Ambulatory Visit | Attending: Certified Nurse Midwife | Admitting: Certified Nurse Midwife

## 2017-06-20 ENCOUNTER — Ambulatory Visit (INDEPENDENT_AMBULATORY_CARE_PROVIDER_SITE_OTHER): Payer: Medicaid Other | Admitting: Certified Nurse Midwife

## 2017-06-20 ENCOUNTER — Encounter: Payer: Self-pay | Admitting: Certified Nurse Midwife

## 2017-06-20 VITALS — BP 128/84 | HR 97 | Wt 217.6 lb

## 2017-06-20 DIAGNOSIS — O219 Vomiting of pregnancy, unspecified: Secondary | ICD-10-CM

## 2017-06-20 DIAGNOSIS — R7303 Prediabetes: Secondary | ICD-10-CM | POA: Insufficient documentation

## 2017-06-20 DIAGNOSIS — O99212 Obesity complicating pregnancy, second trimester: Secondary | ICD-10-CM

## 2017-06-20 DIAGNOSIS — Z8679 Personal history of other diseases of the circulatory system: Secondary | ICD-10-CM

## 2017-06-20 DIAGNOSIS — Z3490 Encounter for supervision of normal pregnancy, unspecified, unspecified trimester: Secondary | ICD-10-CM

## 2017-06-20 DIAGNOSIS — Z3A14 14 weeks gestation of pregnancy: Secondary | ICD-10-CM | POA: Insufficient documentation

## 2017-06-20 DIAGNOSIS — O0991 Supervision of high risk pregnancy, unspecified, first trimester: Secondary | ICD-10-CM | POA: Diagnosis not present

## 2017-06-20 DIAGNOSIS — O0992 Supervision of high risk pregnancy, unspecified, second trimester: Secondary | ICD-10-CM | POA: Diagnosis not present

## 2017-06-20 DIAGNOSIS — O0990 Supervision of high risk pregnancy, unspecified, unspecified trimester: Secondary | ICD-10-CM | POA: Diagnosis present

## 2017-06-20 DIAGNOSIS — O99330 Smoking (tobacco) complicating pregnancy, unspecified trimester: Secondary | ICD-10-CM | POA: Insufficient documentation

## 2017-06-20 DIAGNOSIS — O0930 Supervision of pregnancy with insufficient antenatal care, unspecified trimester: Secondary | ICD-10-CM | POA: Insufficient documentation

## 2017-06-20 DIAGNOSIS — Z720 Tobacco use: Secondary | ICD-10-CM

## 2017-06-20 DIAGNOSIS — E669 Obesity, unspecified: Secondary | ICD-10-CM

## 2017-06-20 DIAGNOSIS — O0932 Supervision of pregnancy with insufficient antenatal care, second trimester: Secondary | ICD-10-CM

## 2017-06-20 HISTORY — DX: Obesity complicating pregnancy, second trimester: O99.212

## 2017-06-20 MED ORDER — ONDANSETRON HCL 8 MG PO TABS: 8.0000 mg | ORAL_TABLET | Freq: Three times a day (TID) | ORAL | 2 refills | Status: DC | PRN

## 2017-06-20 MED ORDER — DOXYLAMINE-PYRIDOXINE 10-10 MG PO TBEC: DELAYED_RELEASE_TABLET | ORAL | 4 refills | Status: DC

## 2017-06-20 MED ORDER — PRENATE PIXIE 10-0.6-0.4-200 MG PO CAPS: 1.0000 | ORAL_CAPSULE | Freq: Every day | ORAL | 12 refills | Status: DC

## 2017-06-20 MED ORDER — ASPIRIN 81 MG PO CHEW: 81.0000 mg | CHEWABLE_TABLET | Freq: Every day | ORAL | 12 refills | Status: DC

## 2017-06-20 NOTE — Progress Notes (Signed)
Subjective:   Olivia [name] is a 31 y.o. G3P0020 at [redacted]w[redacted]d by LMP being seen today for her first obstetrical visit.  Her obstetrical history is significant for previous hx of HTN, has not been on medications for a while. Long history of mental health disorders: Taking Seroquel and Prozac, was on Wellbutrin at start of Pregnancy.  Is established with [facility] and saw them a month ago.  Patient does intend to breast feed. Pregnancy history fully reviewed.  Patient reports no complaints.  HISTORY: Obstetric History   G3   P0   T0   P0   A2   L0    SAB1   TAB1   Ectopic0   Multiple0   Live Births0     # Outcome Date GA Lbr Len/2nd Weight Sex Delivery Anes PTL Lv  3 Current           2 TAB 2017          1 SAB 2016             Past Medical History:  Diagnosis Date  . Anxiety   . Bipolar 1 disorder ([facility])   . Hypertension   . Migraine   . Ovarian cyst   . PTSD (post-traumatic stress disorder)    Past Surgical History:  Procedure Laterality Date  . WISDOM TOOTH EXTRACTION     Family History  Problem Relation Age of Onset  . Hypertension Mother   . Migraines Mother   . Hypertension Father   . Cancer Father        prostate  . Diabetes Father   . Heart disease Maternal Grandmother   . Heart attack Maternal Grandmother 65   Social History  Substance Use Topics  . Smoking status: Current Every Day Smoker    Packs/day: 0.50    Types: Cigarettes  . Smokeless tobacco: Never Used  . Alcohol use Yes     Comment: socially   No Known Allergies Current Outpatient Prescriptions on File Prior to Visit  Medication Sig Dispense Refill  . FLUoxetine (PROZAC) 10 MG capsule Take 1 capsule (10 mg total) by mouth daily. 30 capsule 0  . QUEtiapine (SEROQUEL XR) 300 MG 24 hr tablet Take 300 mg by mouth at bedtime.    . dicyclomine (BENTYL) 20 MG tablet Take 1 tablet (20 mg total) by mouth 2 (two) times daily. (Patient not taking: Reported on 06/20/2017) 20 tablet 0  .  hydrochlorothiazide (HYDRODIURIL) 25 MG tablet Take 1 tablet (25 mg total) by mouth daily. (Patient not taking: Reported on 06/20/2017) 30 tablet 2  . ondansetron (ZOFRAN ODT) 4 MG disintegrating tablet Take 1 tablet (4 mg total) by mouth every 8 (eight) hours as needed for nausea or vomiting. (Patient not taking: Reported on 06/20/2017) 10 tablet 0  . QUEtiapine (SEROQUEL) 25 MG tablet Take 25 mg by mouth every morning.     Marland Kitchen QUEtiapine (SEROQUEL) 300 MG tablet Take 1 tablet (300 mg total) by mouth at bedtime. (Patient not taking: Reported on 06/20/2017) 30 tablet 0   No current facility-administered medications on file prior to visit.      Exam   Vitals:   06/20/17 0929  BP: 128/84  Pulse: 97  Weight: 217 lb 9.6 oz (98.7 kg)      Uterus:     Pelvic Exam: Perineum: no hemorrhoids, normal perineum   Vulva: normal external genitalia, no lesions   Vagina:  normal mucosa, normal discharge   Cervix: no  lesions and normal, pap smear done.    Adnexa: normal adnexa and no mass, fullness, tenderness   Bony Pelvis: average  System: General: well-developed, well-nourished female in no acute distress   Breast:  normal appearance, no masses or tenderness   Skin: normal coloration and turgor, no rashes   Neurologic: oriented, normal, negative, normal mood   Extremities: normal strength, tone, and muscle mass, ROM of all joints is normal   HEENT PERRLA, extraocular movement intact and sclera clear, anicteric   Mouth/Teeth mucous membranes moist, pharynx normal without lesions and dental hygiene good   Neck supple and no masses   Cardiovascular: regular rate and rhythm   Respiratory:  no respiratory distress, normal breath sounds   Abdomen: soft, non-tender; bowel sounds normal; no masses,  no organomegaly     Assessment:   Pregnancy: [id number] Patient Active Problem List   Diagnosis Date Noted  . History of hypertension 06/20/2017  . Obesity affecting pregnancy in second trimester  06/20/2017  . Late prenatal care affecting pregnancy 06/20/2017  . Tobacco abuse 06/20/2017  . 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 Supervision of high risk pregnancy, antepartum 12/03/2014  . Smoker 12/03/2014     Plan:  1. Encounter for supervision of normal pregnancy, antepartum, unspecified gravidity      - Culture, OB Urine - ToxASSURE Select 13 (MW), Urine - Hemoglobinopathy evaluation - Obstetric Panel, Including HIV - VITAMIN D 25 Hydroxy (Vit-D Deficiency, Fractures) - Varicella zoster antibody, IgG - Cytology - PAP - Cervicovaginal ancillary only - MaterniT21 PLUS Core+SCA - Hemoglobin A1c - Inheritest Society Guided  2. Obesity affecting pregnancy in second trimester     - US MFM OB DETAIL +14 WK; Future  3. History of hypertension     - aspirin 81 MG chewable tablet; Chew 1 tablet (81 mg total) by mouth daily.  Dispense: 30 tablet; Refill: 12 - Comprehensive metabolic panel  4. Supervision of high risk pregnancy, antepartum     - Prenat-FeAsp-Meth-FA-DHA w/o A (PRENATE PIXIE) 10-0.6-0.4-200 MG CAPS; Take 1 tablet by mouth daily.  Dispense: 30 capsule; Refill: 12  5. Late prenatal care affecting pregnancy in second trimester     @14  weeks  6. Nausea and vomiting during pregnancy prior to [redacted] weeks gestation     - ondansetron (ZOFRAN) 8 MG tablet; Take 1 tablet (8 mg total) by mouth every 8 (eight) hours as needed for nausea or vomiting.  Dispense: 40 tablet; Refill: 2 - Doxylamine-Pyridoxine (DICLEGIS) 10-10 MG TBEC; Take 1 tablet with breakfast and lunch.  Take 2 tablets at bedtime.  Dispense: 100 tablet; Refill: 4  7. Tobacco abuse      Smokes about 3-4 cigs/day   Initial labs drawn. Continue prenatal vitamins. Genetic Screening discussed, NIPS: ordered. Ultrasound discussed; fetal anatomic survey: ordered. Problem list reviewed and updated. The nature of [facility] with multiple MDs and other Advanced Practice Providers was explained to patient; also emphasized that residents, students are part of our team. Routine obstetric precautions reviewed. [facility] (around [facility]18[facility]2017[facility] Needs to see FP MD here.     Orvilla Cornwallachelle Felipe Paluch, CNM [facility]

## 2017-06-21 LAB — CERVICOVAGINAL ANCILLARY ONLY
BACTERIAL VAGINITIS: NEGATIVE
CANDIDA VAGINITIS: NEGATIVE
CHLAMYDIA, DNA PROBE: NEGATIVE
Neisseria Gonorrhea: NEGATIVE
Trichomonas: NEGATIVE

## 2017-06-22 LAB — COMPREHENSIVE METABOLIC PANEL
ALT: 12 IU/L (ref 0–32)
AST: 16 IU/L (ref 0–40)
Albumin/Globulin Ratio: 1.5 (ref 1.2–2.2)
Albumin: 4.1 g/dL (ref 3.5–5.5)
Alkaline Phosphatase: 77 IU/L (ref 39–117)
BUN/Creatinine Ratio: 11 (ref 9–23)
BUN: 6 mg/dL (ref 6–20)
Bilirubin Total: <0.2 mg/dL (ref 0.0–1.2)
CALCIUM: 9.7 mg/dL (ref 8.7–10.2)
CO2: 20 mmol/L (ref 20–29)
CREATININE: 0.57 mg/dL (ref 0.57–1.00)
Chloride: 101 mmol/L (ref 96–106)
GFR calc Af Amer: 143 mL/min/{1.73_m2} (ref >59)
GFR calc non Af Amer: 124 mL/min/{1.73_m2} (ref >59)
GLOBULIN, TOTAL: 2.8 g/dL (ref 1.5–4.5)
Glucose: 76 mg/dL (ref 65–99)
POTASSIUM: 4 mmol/L (ref 3.5–5.2)
SODIUM: 138 mmol/L (ref 134–144)
TOTAL PROTEIN: 6.9 g/dL (ref 6.0–8.5)

## 2017-06-22 LAB — HEMOGLOBINOPATHY EVALUATION
HGB A: 98 % (ref 96.4–98.8)
HGB C: 0 %
HGB S: 0 %
HGB VARIANT: 0 %
Hemoglobin A2 Quantitation: 2 % (ref 1.8–3.2)
Hemoglobin F Quantitation: 0 % (ref 0.0–2.0)

## 2017-06-22 LAB — OBSTETRIC PANEL, INCLUDING HIV
ANTIBODY SCREEN: NEGATIVE
BASOS ABS: 0 10*3/uL (ref 0.0–0.2)
BASOS: 0 %
EOS (ABSOLUTE): 0.2 10*3/uL (ref 0.0–0.4)
Eos: 2 %
HIV Screen 4th Generation wRfx: NONREACTIVE
Hematocrit: 34.7 % (ref 34.0–46.6)
Hemoglobin: 11.6 g/dL (ref 11.1–15.9)
Hepatitis B Surface Ag: NEGATIVE
IMMATURE GRANS (ABS): 0 10*3/uL (ref 0.0–0.1)
Immature Granulocytes: 0 %
LYMPHS: 24 %
Lymphocytes Absolute: 2.4 10*3/uL (ref 0.7–3.1)
MCH: 28.5 pg (ref 26.6–33.0)
MCHC: 33.4 g/dL (ref 31.5–35.7)
MCV: 85 fL (ref 79–97)
MONOCYTES: 6 %
MONOS ABS: 0.6 10*3/uL (ref 0.1–0.9)
NEUTROS PCT: 68 %
Neutrophils Absolute: 6.8 10*3/uL (ref 1.4–7.0)
PLATELETS: 378 10*3/uL (ref 150–379)
RBC: 4.07 x10E6/uL (ref 3.77–5.28)
RDW: 14.4 % (ref 12.3–15.4)
RPR Ser Ql: NONREACTIVE
RUBELLA: 1.58 {index} (ref >0.99)
Rh Factor: NEGATIVE
WBC: 10 10*3/uL (ref 3.4–10.8)

## 2017-06-22 LAB — URINE CULTURE, OB REFLEX

## 2017-06-22 LAB — CYTOLOGY - PAP
DIAGNOSIS: NEGATIVE
HPV (WINDOPATH): NOT DETECTED

## 2017-06-22 LAB — HEMOGLOBIN A1C
ESTIMATED AVERAGE GLUCOSE: 120 mg/dL
Hgb A1c MFr Bld: 5.8 % — ABNORMAL HIGH (ref 4.8–5.6)

## 2017-06-22 LAB — VITAMIN D 25 HYDROXY (VIT D DEFICIENCY, FRACTURES): VIT D 25 HYDROXY: 21.5 ng/mL — AB (ref 30.0–100.0)

## 2017-06-22 LAB — CULTURE, OB URINE

## 2017-06-22 LAB — VARICELLA ZOSTER ANTIBODY, IGG: VARICELLA: 2121 {index} (ref >165)

## 2017-06-25 ENCOUNTER — Other Ambulatory Visit: Payer: Self-pay | Admitting: Certified Nurse Midwife

## 2017-06-25 ENCOUNTER — Encounter: Payer: Self-pay | Admitting: *Deleted

## 2017-06-25 DIAGNOSIS — O26899 Other specified pregnancy related conditions, unspecified trimester: Secondary | ICD-10-CM

## 2017-06-25 DIAGNOSIS — Z6791 Unspecified blood type, Rh negative: Secondary | ICD-10-CM

## 2017-06-25 DIAGNOSIS — R7309 Other abnormal glucose: Secondary | ICD-10-CM | POA: Insufficient documentation

## 2017-06-25 DIAGNOSIS — O0990 Supervision of high risk pregnancy, unspecified, unspecified trimester: Secondary | ICD-10-CM

## 2017-06-25 DIAGNOSIS — R7989 Other specified abnormal findings of blood chemistry: Secondary | ICD-10-CM

## 2017-06-25 HISTORY — DX: Other specified pregnancy related conditions, unspecified trimester: O26.899

## 2017-06-25 HISTORY — DX: Unspecified blood type, rh negative: Z67.91

## 2017-06-25 HISTORY — DX: Other abnormal glucose: R73.09

## 2017-06-25 HISTORY — DX: Other specified abnormal findings of blood chemistry: R79.89

## 2017-06-25 LAB — MATERNIT21 PLUS CORE+SCA
Chromosome 13: NEGATIVE
Chromosome 18: NEGATIVE
Chromosome 21: NEGATIVE
Y CHROMOSOME: DETECTED

## 2017-06-25 MED ORDER — VITAMIN D (ERGOCALCIFEROL) 1.25 MG (50000 UNIT) PO CAPS: 50000.0000 [IU] | ORAL_CAPSULE | ORAL | 2 refills | Status: DC

## 2017-06-28 ENCOUNTER — Other Ambulatory Visit: Payer: Self-pay | Admitting: Certified Nurse Midwife

## 2017-06-28 DIAGNOSIS — F1411 Cocaine abuse, in remission: Secondary | ICD-10-CM | POA: Insufficient documentation

## 2017-06-28 DIAGNOSIS — F1490 Cocaine use, unspecified, uncomplicated: Principal | ICD-10-CM

## 2017-06-28 DIAGNOSIS — O99322 Drug use complicating pregnancy, second trimester: Secondary | ICD-10-CM | POA: Insufficient documentation

## 2017-06-28 LAB — TOXASSURE SELECT 13 (MW), URINE

## 2017-06-28 NOTE — Progress Notes (Signed)
Positive Cocaine UDS.

## 2017-07-05 LAB — INHERITEST SOCIETY GUIDED

## 2017-07-06 ENCOUNTER — Other Ambulatory Visit: Payer: Self-pay | Admitting: Certified Nurse Midwife

## 2017-07-06 DIAGNOSIS — O0990 Supervision of high risk pregnancy, unspecified, unspecified trimester: Secondary | ICD-10-CM

## 2017-07-11 ENCOUNTER — Encounter: Payer: Self-pay | Admitting: *Deleted

## 2017-07-12 ENCOUNTER — Other Ambulatory Visit: Payer: Self-pay | Admitting: Certified Nurse Midwife

## 2017-07-12 ENCOUNTER — Encounter: Payer: Self-pay | Admitting: Family Medicine

## 2017-07-12 DIAGNOSIS — O99322 Drug use complicating pregnancy, second trimester: Secondary | ICD-10-CM

## 2017-07-16 ENCOUNTER — Encounter (HOSPITAL_COMMUNITY): Payer: Self-pay | Admitting: Certified Nurse Midwife

## 2017-07-18 ENCOUNTER — Ambulatory Visit (INDEPENDENT_AMBULATORY_CARE_PROVIDER_SITE_OTHER): Payer: Medicaid Other | Admitting: Certified Nurse Midwife

## 2017-07-18 ENCOUNTER — Other Ambulatory Visit: Payer: Medicaid Other

## 2017-07-18 ENCOUNTER — Encounter: Payer: Self-pay | Admitting: Certified Nurse Midwife

## 2017-07-18 VITALS — BP 131/81 | HR 87 | Wt 212.0 lb

## 2017-07-18 DIAGNOSIS — R7989 Other specified abnormal findings of blood chemistry: Secondary | ICD-10-CM

## 2017-07-18 DIAGNOSIS — O26892 Other specified pregnancy related conditions, second trimester: Secondary | ICD-10-CM

## 2017-07-18 DIAGNOSIS — O0932 Supervision of pregnancy with insufficient antenatal care, second trimester: Secondary | ICD-10-CM

## 2017-07-18 DIAGNOSIS — R7309 Other abnormal glucose: Secondary | ICD-10-CM

## 2017-07-18 DIAGNOSIS — O0992 Supervision of high risk pregnancy, unspecified, second trimester: Secondary | ICD-10-CM

## 2017-07-18 DIAGNOSIS — O99322 Drug use complicating pregnancy, second trimester: Secondary | ICD-10-CM

## 2017-07-18 DIAGNOSIS — F1490 Cocaine use, unspecified, uncomplicated: Secondary | ICD-10-CM

## 2017-07-18 DIAGNOSIS — O0990 Supervision of high risk pregnancy, unspecified, unspecified trimester: Secondary | ICD-10-CM

## 2017-07-18 DIAGNOSIS — H66004 Acute suppurative otitis media without spontaneous rupture of ear drum, recurrent, right ear: Secondary | ICD-10-CM | POA: Insufficient documentation

## 2017-07-18 DIAGNOSIS — R51 Headache: Secondary | ICD-10-CM

## 2017-07-18 MED ORDER — AMOXICILLIN-POT CLAVULANATE 875-125 MG PO TABS: 1.0000 | ORAL_TABLET | Freq: Two times a day (BID) | ORAL | 0 refills | Status: DC

## 2017-07-18 MED ORDER — BUTALBITAL-APAP-CAFFEINE 50-325-40 MG PO TABS: 1.0000 | ORAL_TABLET | Freq: Four times a day (QID) | ORAL | 0 refills | Status: DC | PRN

## 2017-07-18 NOTE — Progress Notes (Signed)
PRENATAL VISIT NOTE  Subjective:  Olivia [name] is a 31 y.o. G3P0020 at 20w3dbeing seen today for ongoing prenatal care.  She is currently monitored for the following issues for this high-risk pregnancy and has Supervision of high risk pregnancy, antepartum; MDD (major depressive disorder), recurrent severe, without psychosis (HAstoria; Prediabetes; Obesity affecting pregnancy in second trimester; Late prenatal care affecting pregnancy; Tobacco use affecting pregnancy, antepartum; Low vitamin D level; Rh negative state in antepartum period; Elevated hemoglobin A1c; Cocaine use complicating pregnancy, second trimester; and Recurrent acute suppurative otitis media of right ear without spontaneous rupture of tympanic membrane on her problem list.  Patient reports headache, no bleeding, no contractions, no cramping, no leaking and right ear pain started a week ago; has chronic ear infections.  Has not tried anything for it.  .  Contractions: Not present. Vag. Bleeding: None.  Movement: Absent. Denies leaking of fluid.   The following portions of the patient's history were reviewed and updated as appropriate: allergies, current medications, past family history, past medical history, past social history, past surgical history and problem list. Problem list updated.  Objective:   Vitals:   07/18/17 0842  BP: 131/81  Pulse: 87  Weight: 212 lb (96.2 kg)    Fetal Status: Fetal Heart Rate (bpm): 135; doppler   Movement: Absent     General:  Alert, oriented and cooperative. Patient is in no acute distress.  Skin: Skin is warm and dry. No rash noted.   Cardiovascular: Normal heart rate noted  Respiratory: Normal respiratory effort, no problems with respiration noted  Abdomen: Soft, gravid, appropriate for gestational age.  Pain/Pressure: Absent     Pelvic: Cervical exam deferred        Extremities: Normal range of motion.     Mental Status:  Normal mood and affect. Normal behavior. Normal judgment  and thought content.   Assessment and Plan:  Pregnancy: [id number] 171w3d1. Late prenatal care affecting pregnancy in second trimester       2. Low vitamin D level     Taking weekly vitamin D  3. Supervision of high risk pregnancy, antepartum      Discussed Positive urine drug screen.  Patient verbalized understanding.  Denies problems with depression.  Depression screening was negative.  SW in office met with patient.   - Ambulatory referral to [facility]. Elevated hemoglobin A1c     Early 2 hour OGTT today.   - Glucose Tolerance, 2 Hours w/1 Hour - CBC - HIV antibody - RPR  5. Cocaine use complicating pregnancy, second trimester      States that her boyfriend uses.  Does not know how she got cocaine into her system.  - Ambulatory referral to [facility]. Headache in pregnancy, antepartum, second trimester       - butalbital-acetaminophen-caffeine (FIORICET, ESGIC) 50-325-40 MG tablet; Take 1-2 tablets by mouth every 6 (six) hours as needed.  Dispense: 45 tablet; Refill: 0  7. Recurrent acute suppurative otitis media of right ear without spontaneous rupture of tympanic membrane      - amoxicillin-clavulanate (AUGMENTIN) 875-125 MG tablet; Take 1 tablet by mouth 2 (two) times daily.  Dispense: 14 tablet; Refill: 0  Preterm labor symptoms and general obstetric precautions including but not limited to vaginal bleeding, contractions, leaking of fluid and fetal movement were reviewed in detail with the patient. Please refer to After Visit Summary for other counseling recommendations.  Return in about 4 weeks (  around 08/15/2017) for [place] [facility], [place].   Morene Crocker, CNM

## 2017-07-18 NOTE — Progress Notes (Signed)
Pt states that she may have an ear infection.   Pt states she has constant HA.

## 2017-07-19 ENCOUNTER — Other Ambulatory Visit: Payer: Self-pay | Admitting: Certified Nurse Midwife

## 2017-07-19 DIAGNOSIS — O24919 Unspecified diabetes mellitus in pregnancy, unspecified trimester: Secondary | ICD-10-CM | POA: Insufficient documentation

## 2017-07-19 DIAGNOSIS — O24112 Pre-existing diabetes mellitus, type 2, in pregnancy, second trimester: Secondary | ICD-10-CM

## 2017-07-19 LAB — GLUCOSE TOLERANCE, 2 HOURS W/ 1HR
GLUCOSE, FASTING: 83 mg/dL (ref 65–91)
Glucose, 1 hour: 195 mg/dL — ABNORMAL HIGH (ref 65–179)
Glucose, 2 hour: 164 mg/dL — ABNORMAL HIGH (ref 65–152)

## 2017-07-23 ENCOUNTER — Ambulatory Visit (HOSPITAL_COMMUNITY)
Admission: RE | Admit: 2017-07-23 | Discharge: 2017-07-23 | Disposition: A | Discharge status: Home / Self Care | Payer: Medicaid Other | Source: Ambulatory Visit | Attending: Certified Nurse Midwife | Admitting: Certified Nurse Midwife

## 2017-07-23 ENCOUNTER — Telehealth: Payer: Self-pay

## 2017-07-23 ENCOUNTER — Other Ambulatory Visit: Payer: Self-pay | Admitting: Certified Nurse Midwife

## 2017-07-23 ENCOUNTER — Encounter (HOSPITAL_COMMUNITY): Payer: Self-pay

## 2017-07-23 DIAGNOSIS — Z3689 Encounter for other specified antenatal screening: Secondary | ICD-10-CM | POA: Diagnosis not present

## 2017-07-23 DIAGNOSIS — O99212 Obesity complicating pregnancy, second trimester: Secondary | ICD-10-CM

## 2017-07-23 DIAGNOSIS — F1910 Other psychoactive substance abuse, uncomplicated: Secondary | ICD-10-CM

## 2017-07-23 DIAGNOSIS — Z3A19 19 weeks gestation of pregnancy: Secondary | ICD-10-CM | POA: Diagnosis not present

## 2017-07-23 DIAGNOSIS — O0932 Supervision of pregnancy with insufficient antenatal care, second trimester: Secondary | ICD-10-CM | POA: Diagnosis not present

## 2017-07-23 DIAGNOSIS — O24410 Gestational diabetes mellitus in pregnancy, diet controlled: Secondary | ICD-10-CM

## 2017-07-23 DIAGNOSIS — O99320 Drug use complicating pregnancy, unspecified trimester: Secondary | ICD-10-CM

## 2017-07-23 DIAGNOSIS — O99330 Smoking (tobacco) complicating pregnancy, unspecified trimester: Secondary | ICD-10-CM

## 2017-07-23 DIAGNOSIS — O162 Unspecified maternal hypertension, second trimester: Secondary | ICD-10-CM

## 2017-07-23 DIAGNOSIS — O10019 Pre-existing essential hypertension complicating pregnancy, unspecified trimester: Secondary | ICD-10-CM | POA: Diagnosis not present

## 2017-07-23 DIAGNOSIS — O99322 Drug use complicating pregnancy, second trimester: Secondary | ICD-10-CM | POA: Insufficient documentation

## 2017-07-23 DIAGNOSIS — O0990 Supervision of high risk pregnancy, unspecified, unspecified trimester: Secondary | ICD-10-CM

## 2017-07-23 NOTE — Telephone Encounter (Signed)
-----   Message from Roe Coombs, CNM sent at 07/19/2017 10:43 AM EDT ----- Please let her know that she did not pass her glucose testing.  Please order her glucometer, test strips and lancets.  MFM referral orders, along with fetal ECHO, and opthalmology orders have been placed.  She will need an [facility] appointment with FP here in the office in 2 weeks as well.   Thank you.  R.Denney CNM

## 2017-07-23 NOTE — Telephone Encounter (Signed)
Patient notified of results, Rx and MFM appointments. She was told to expect call from MFM and make sure that she keeps her appt with them. Take BS devices to her appt. Patient verbalize understanding.

## 2017-07-24 ENCOUNTER — Other Ambulatory Visit (HOSPITAL_COMMUNITY): Payer: Self-pay | Admitting: *Deleted

## 2017-07-24 ENCOUNTER — Other Ambulatory Visit: Payer: Self-pay | Admitting: Certified Nurse Midwife

## 2017-07-24 DIAGNOSIS — O0990 Supervision of high risk pregnancy, unspecified, unspecified trimester: Secondary | ICD-10-CM

## 2017-07-24 DIAGNOSIS — O24419 Gestational diabetes mellitus in pregnancy, unspecified control: Secondary | ICD-10-CM

## 2017-07-26 ENCOUNTER — Other Ambulatory Visit: Payer: Self-pay

## 2017-07-26 ENCOUNTER — Ambulatory Visit: Payer: Medicaid Other | Admitting: *Deleted

## 2017-07-26 ENCOUNTER — Encounter: Payer: Medicaid Other | Attending: Family Medicine | Admitting: *Deleted

## 2017-07-26 DIAGNOSIS — O24419 Gestational diabetes mellitus in pregnancy, unspecified control: Secondary | ICD-10-CM | POA: Insufficient documentation

## 2017-07-26 DIAGNOSIS — R7309 Other abnormal glucose: Secondary | ICD-10-CM

## 2017-07-26 DIAGNOSIS — Z3A00 Weeks of gestation of pregnancy not specified: Secondary | ICD-10-CM | POA: Diagnosis not present

## 2017-07-26 MED ORDER — ACCU-CHEK FASTCLIX LANCETS MISC: 1.0000 | Freq: Four times a day (QID) | 12 refills | Status: DC

## 2017-07-26 MED ORDER — ACCU-CHEK GUIDE W/DEVICE KIT: 1.0000 | PACK | Freq: Four times a day (QID) | 0 refills | Status: DC

## 2017-07-26 MED ORDER — GLUCOSE BLOOD VI STRP: ORAL_STRIP | 12 refills | Status: DC

## 2017-07-26 NOTE — Progress Notes (Signed)
  Patient was seen on 07/26/2017 for Gestational Diabetes self-management . Patient is here with her mother who appeared supportive. Patient was anxious at beginning of meeting regarding pricking her finger and concern over dietary changes she thought were excessive. EDD 12/16/2017. She states she is not aware of any history of diabetes before this pregnancy. Diet history obtained, of note, she states she drinks regular soda regularly. The following learning objectives were met by the patient :   States the definition of Gestational Diabetes  States why dietary management is important in controlling blood glucose  Describes the effects of carbohydrates on blood glucose levels  Demonstrates ability to create a balanced meal plan  Demonstrates carbohydrate counting   States when to check blood glucose levels  Demonstrates proper blood glucose monitoring techniques  States the effect of stress and exercise on blood glucose levels  States the importance of limiting caffeine and abstaining from alcohol and smoking  Plan:  Aim for 3 Carb Choices per meal (45 grams) +/- 1 either way  Aim for 1-2 Carbs per snack Begin reading food labels for Total Carbohydrate of foods Consider  increasing your activity level by walking or other activity daily as tolerated Begin checking BG before breakfast and 2 hours after first bite of breakfast, lunch and dinner as directed by MD  Bring Log Book to every medical appointment   Take medication if directed by MD  Patient informed of Baby Scripts, she would prefer to write down BG on Log Sheet for now.  Blood glucose monitor Rx called into pharmacy: [place] with Fast Clix drums Patient instructed to test pre breakfast and 2 hours each meal as directed by MD  Patient instructed to monitor glucose levels: FBS: 60 - 95 mg/dl 2 hour: <120 mg/dl  Patient received the following handouts:  Nutrition Diabetes and Pregnancy  Carbohydrate Counting  List  Patient will be seen for follow-up as needed.

## 2017-08-01 ENCOUNTER — Encounter: Payer: Self-pay | Admitting: Obstetrics & Gynecology

## 2017-08-08 ENCOUNTER — Encounter: Payer: Self-pay | Admitting: Obstetrics

## 2017-08-14 ENCOUNTER — Other Ambulatory Visit: Payer: Self-pay | Admitting: *Deleted

## 2017-08-14 DIAGNOSIS — Z7689 Persons encountering health services in other specified circumstances: Secondary | ICD-10-CM

## 2017-08-14 NOTE — Progress Notes (Signed)
Pt called to office stating that she has a tooth that came out and is needing referral to dentist. Pt advised referral to be entered and she will be contacted about appt.

## 2017-08-15 ENCOUNTER — Encounter: Payer: Self-pay | Admitting: Obstetrics & Gynecology

## 2017-08-16 ENCOUNTER — Encounter: Payer: Self-pay | Admitting: Obstetrics

## 2017-08-16 ENCOUNTER — Other Ambulatory Visit: Payer: Self-pay

## 2017-08-16 ENCOUNTER — Ambulatory Visit (INDEPENDENT_AMBULATORY_CARE_PROVIDER_SITE_OTHER): Payer: Medicaid Other | Admitting: Obstetrics

## 2017-08-16 VITALS — BP 120/82 | HR 105 | Wt 209.0 lb

## 2017-08-16 DIAGNOSIS — J301 Allergic rhinitis due to pollen: Secondary | ICD-10-CM

## 2017-08-16 DIAGNOSIS — Z23 Encounter for immunization: Secondary | ICD-10-CM | POA: Diagnosis not present

## 2017-08-16 DIAGNOSIS — K2100 Gastro-esophageal reflux disease with esophagitis, without bleeding: Secondary | ICD-10-CM

## 2017-08-16 DIAGNOSIS — O0992 Supervision of high risk pregnancy, unspecified, second trimester: Secondary | ICD-10-CM

## 2017-08-16 DIAGNOSIS — O0990 Supervision of high risk pregnancy, unspecified, unspecified trimester: Secondary | ICD-10-CM

## 2017-08-16 MED ORDER — LORATADINE 10 MG PO TABS: 10.0000 mg | ORAL_TABLET | Freq: Every day | ORAL | 11 refills | Status: DC

## 2017-08-16 MED ORDER — OMEPRAZOLE 20 MG PO CPDR: 20.0000 mg | DELAYED_RELEASE_CAPSULE | Freq: Two times a day (BID) | ORAL | 5 refills | Status: DC

## 2017-08-16 NOTE — Progress Notes (Signed)
Subjective:  Olivia [name] is a 31 y.o. G3P0020 at 4564w4d being seen today for ongoing prenatal care.  She is currently monitored for the following issues for this high-risk pregnancy and has Supervision of high risk pregnancy, antepartum; MDD (major depressive disorder), recurrent severe, without psychosis (HCC); Obesity affecting pregnancy in second trimester; Late prenatal care affecting pregnancy; Tobacco use affecting pregnancy, antepartum; Low vitamin D level; Rh negative state in antepartum period; Elevated hemoglobin A1c; Cocaine use complicating pregnancy, second trimester; Recurrent acute suppurative otitis media of right ear without spontaneous rupture of tympanic membrane; and Diabetes in pregnancy on their problem list.  Patient reports headache and allergies.  Contractions: Irritability. Vag. Bleeding: None.  Movement: Present. Denies leaking of fluid.   The following portions of the patient's history were reviewed and updated as appropriate: allergies, current medications, past family history, past medical history, past social history, past surgical history and problem list. Problem list updated.  Objective:   Vitals:   08/16/17 1512  BP: 120/82  Pulse: (!) 105  Weight: 209 lb (94.8 kg)    Fetal Status: Fetal Heart Rate (bpm): 140   Movement: Present     General:  Alert, oriented and cooperative. Patient is in no acute distress.  Skin: Skin is warm and dry. No rash noted.   Cardiovascular: Normal heart rate noted  Respiratory: Normal respiratory effort, no problems with respiration noted  Abdomen: Soft, gravid, appropriate for gestational age. Pain/Pressure: Absent     Pelvic:  Cervical exam deferred        Extremities: Normal range of motion.  Edema: Trace  Mental Status: Normal mood and affect. Normal behavior. Normal judgment and thought content.   Urinalysis:      Assessment and Plan:  Pregnancy: G3P0020 at 1164w4d  1. Supervision of high risk pregnancy,  antepartum Rx: - Flu Vaccine QUAD 36+ mos IM (Fluarix, Quad PF)  2. GERD with esophagitis Rx: - omeprazole (PRILOSEC) 20 MG capsule; Take 1 capsule (20 mg total) 2 (two) times daily before a meal by mouth.  Dispense: 60 capsule; Refill: 5  3. Seasonal allergic rhinitis due to pollen Rx: - loratadine (CLARITIN) 10 MG tablet; Take 1 tablet (10 mg total) daily by mouth.  Dispense: 30 tablet; Refill: 11  Preterm labor symptoms and general obstetric precautions including but not limited to vaginal bleeding, contractions, leaking of fluid and fetal movement were reviewed in detail with the patient. Please refer to After Visit Summary for other counseling recommendations.  Return in about 4 weeks (around 09/13/2017) for ROB.   Brock BadHarper, Adore Kithcart A, MD

## 2017-08-16 NOTE — Progress Notes (Signed)
FLU vaccine given  Right deltoid. Tolerated well

## 2017-08-20 ENCOUNTER — Ambulatory Visit (HOSPITAL_COMMUNITY): Payer: Medicaid Other

## 2017-08-24 ENCOUNTER — Ambulatory Visit (HOSPITAL_COMMUNITY)
Admission: RE | Admit: 2017-08-24 | Discharge: 2017-08-24 | Disposition: A | Discharge status: Home / Self Care | Payer: Medicaid Other | Source: Ambulatory Visit | Attending: Maternal and Fetal Medicine | Admitting: Maternal and Fetal Medicine

## 2017-08-24 ENCOUNTER — Encounter (HOSPITAL_COMMUNITY): Payer: Self-pay

## 2017-08-24 ENCOUNTER — Ambulatory Visit (HOSPITAL_COMMUNITY)
Admission: RE | Admit: 2017-08-24 | Discharge: 2017-08-24 | Disposition: A | Discharge status: Home / Self Care | Payer: Medicaid Other | Source: Ambulatory Visit | Attending: Certified Nurse Midwife | Admitting: Certified Nurse Midwife

## 2017-08-24 ENCOUNTER — Other Ambulatory Visit (HOSPITAL_COMMUNITY): Payer: Self-pay | Admitting: Maternal and Fetal Medicine

## 2017-08-24 ENCOUNTER — Other Ambulatory Visit (HOSPITAL_COMMUNITY): Payer: Self-pay | Admitting: *Deleted

## 2017-08-24 DIAGNOSIS — Z8042 Family history of malignant neoplasm of prostate: Secondary | ICD-10-CM | POA: Diagnosis not present

## 2017-08-24 DIAGNOSIS — O24419 Gestational diabetes mellitus in pregnancy, unspecified control: Secondary | ICD-10-CM

## 2017-08-24 DIAGNOSIS — O99332 Smoking (tobacco) complicating pregnancy, second trimester: Secondary | ICD-10-CM | POA: Insufficient documentation

## 2017-08-24 DIAGNOSIS — O24410 Gestational diabetes mellitus in pregnancy, diet controlled: Secondary | ICD-10-CM | POA: Insufficient documentation

## 2017-08-24 DIAGNOSIS — O99212 Obesity complicating pregnancy, second trimester: Secondary | ICD-10-CM | POA: Insufficient documentation

## 2017-08-24 DIAGNOSIS — Z8249 Family history of ischemic heart disease and other diseases of the circulatory system: Secondary | ICD-10-CM | POA: Insufficient documentation

## 2017-08-24 DIAGNOSIS — N83209 Unspecified ovarian cyst, unspecified side: Secondary | ICD-10-CM | POA: Insufficient documentation

## 2017-08-24 DIAGNOSIS — Z3A23 23 weeks gestation of pregnancy: Secondary | ICD-10-CM | POA: Diagnosis not present

## 2017-08-24 DIAGNOSIS — O10012 Pre-existing essential hypertension complicating pregnancy, second trimester: Secondary | ICD-10-CM | POA: Diagnosis not present

## 2017-08-24 DIAGNOSIS — O99352 Diseases of the nervous system complicating pregnancy, second trimester: Secondary | ICD-10-CM | POA: Insufficient documentation

## 2017-08-24 DIAGNOSIS — O26892 Other specified pregnancy related conditions, second trimester: Secondary | ICD-10-CM | POA: Diagnosis not present

## 2017-08-24 DIAGNOSIS — O99322 Drug use complicating pregnancy, second trimester: Secondary | ICD-10-CM | POA: Insufficient documentation

## 2017-08-24 DIAGNOSIS — G43909 Migraine, unspecified, not intractable, without status migrainosus: Secondary | ICD-10-CM | POA: Insufficient documentation

## 2017-08-24 DIAGNOSIS — O3482 Maternal care for other abnormalities of pelvic organs, second trimester: Secondary | ICD-10-CM | POA: Insufficient documentation

## 2017-08-24 DIAGNOSIS — Z833 Family history of diabetes mellitus: Secondary | ICD-10-CM | POA: Diagnosis not present

## 2017-08-24 DIAGNOSIS — O24112 Pre-existing diabetes mellitus, type 2, in pregnancy, second trimester: Secondary | ICD-10-CM

## 2017-08-24 DIAGNOSIS — F319 Bipolar disorder, unspecified: Secondary | ICD-10-CM | POA: Insufficient documentation

## 2017-08-24 DIAGNOSIS — F17210 Nicotine dependence, cigarettes, uncomplicated: Secondary | ICD-10-CM | POA: Insufficient documentation

## 2017-08-24 DIAGNOSIS — F4310 Post-traumatic stress disorder, unspecified: Secondary | ICD-10-CM | POA: Diagnosis not present

## 2017-08-24 DIAGNOSIS — O24919 Unspecified diabetes mellitus in pregnancy, unspecified trimester: Secondary | ICD-10-CM

## 2017-08-24 DIAGNOSIS — O99342 Other mental disorders complicating pregnancy, second trimester: Secondary | ICD-10-CM | POA: Insufficient documentation

## 2017-08-24 DIAGNOSIS — O0990 Supervision of high risk pregnancy, unspecified, unspecified trimester: Secondary | ICD-10-CM

## 2017-08-24 NOTE — Consult Note (Signed)
MATERNAL FETAL MEDICINE CONSULT  Patient Name: [place] Record Number:  [id number] Date of Birth: 1986-03-24 Requesting Physician Name:  Morene Crocker, CNM Date of Service: 08/24/2017  Chief Complaint Diabetes mellitus and chronic hypertension in pregnancy.  History of Present Illness CATHLIN BUCHAN is a 31 y.o. G3P0020,at 53w5dwith an EDD of 12/16/2017, by Last Menstrual Period dating method seen today at the request of DMorene Crocker CNM.  She was recently diagnosed with diabetes based on a 2 hour glucose tolerance test.  She has received diabetic education and dietary counseling already.  She has a history of hypertension outside of pregnancy and was previously taking HCTZ, but stopped that medication upon diagnosis of pregnancy.  She is no currently taking and BP medication.  She denies any significant BP elevations during previous prenatal visits, headache, visual changes, RUQ pain, vaginal bleeding, loss of fluid, or contractions.    Her fetus is active.    Review of Systems Pertinent items are noted in HPI.  Patient History OB History  Gravida Para Term Preterm AB Living  3       2 0  SAB TAB Ectopic Multiple Live Births  1 1          # Outcome Date GA Lbr Len/2nd Weight Sex Delivery Anes PTL Lv  3 Current           2 TAB 2017          1 SAB 2016              Past Medical History:  Diagnosis Date  . Anxiety   . Bipolar 1 disorder ([place]   . Hypertension   . Migraine   . Ovarian cyst   . PTSD (post-traumatic stress disorder)     Past Surgical History:  Procedure Laterality Date  . WISDOM TOOTH EXTRACTION      Social History   Socioeconomic History  . Marital status: Single    Spouse name: Not on file  . Number of children: Not on file  . Years of education: Not on file  . Highest education level: Not on file  Social Needs  . Financial resource strain: Not on file  . Food insecurity - worry: Not on file  . Food insecurity -  inability: Not on file  . Transportation needs - medical: Not on file  . Transportation needs - non-medical: Not on file  Occupational History  . Not on file  Tobacco Use  . Smoking status: Current Every Day Smoker    Packs/day: 0.50    Types: Cigarettes  . Smokeless tobacco: Never Used  Substance and Sexual Activity  . Alcohol use: Yes    Comment: socially  . Drug use: No  . Sexual activity: Yes    Partners: Male  Other Topics Concern  . Not on file  Social History Narrative  . Not on file    Family History  Problem Relation Age of Onset  . Hypertension Mother   . Migraines Mother   . Hypertension Father   . Cancer Father        prostate  . Diabetes Father   . Heart disease Maternal Grandmother   . Heart attack Maternal Grandmother 78   In addition, the patient has no family history of mental retardation, birth defects, or genetic diseases.  Physical Examination There were no vitals filed for this visit. General appearance - alert, well appearing, and in no distress Mental status - alert,  oriented to person, place, and time  Assessment and Recommendations 1.  Diabetes in pregnancy.  Ms. Tetzlaff likely has occult pre-gestational diabetes given the diagnosis early in pregnancy.  She has already met with a dietician and/or diabetes educator to discuss dietary modification, blood glucose testing, and goals of diet therapy.  She understands the goals of therapy including fasting blood sugars less than 90 mg/dl and 2-hour postprandial less than 120 mg/dl with avoidance of hypoglycemia.  If diet therapy is not successful in acheiving adequate glycemic control, she will require medical therapy.  I recommend first starting glyburide 2.5 mg orally with breakfast and increasing as needed to acheive desired glycemic goals.  If the maximum daily dose of glyburide is reached (10 mg bid) and glycemic control remains inadequate, the patient should be stared on insulin.  If desired the  patient's glycemic control can be managed at the [facility].  If the patient requires medical treatment of her diabetes then fetal surveillance should be instituted  with interval serial growth ultrasounds every 4 weeks and once or twice weekly antepartum testing beginning at 32 weeks. 2.  Chronic hypertension.  I discussed the range of complications that are associated with chronic hypertension including, but not limited to, fetal growth disturbance, preterm delivery, preeclampsia, and IUFD with Ms.Diana.  Although a complete metabolic panel has been drawn additional baseline labs such as a CBC and 24 hour urine collection have not been performed yet.  They should be done as soon as possible.  These baseline labs should be repeated each trimester and as clinically indicated based on disease symptoms or the appearance of hypertension.  As chronic hypertension is associated with fetal growth restriction Ms. Tesch should have serial growth scans every 4-6 weeks after her anatomic survey at approximately 18 weeks.  Once or twice weekly fetal surveillance should be started at 32 weeks of gestation.  I spent 30 minutes with Ms. Vanhook today of which 50% was face-to-face counseling.  Thank you for referring Ms. Courington to the [facility].  Please do not hesitate to contact us with questions.   Jolyn Lent, MD

## 2017-08-26 ENCOUNTER — Other Ambulatory Visit: Payer: Self-pay | Admitting: Family Medicine

## 2017-09-13 ENCOUNTER — Encounter: Payer: Self-pay | Admitting: Obstetrics

## 2017-09-20 ENCOUNTER — Encounter: Payer: Self-pay | Admitting: Obstetrics

## 2017-09-21 ENCOUNTER — Ambulatory Visit (HOSPITAL_COMMUNITY)
Admission: RE | Admit: 2017-09-21 | Discharge: 2017-09-21 | Disposition: A | Discharge status: Home / Self Care | Payer: Medicaid Other | Source: Ambulatory Visit | Attending: Certified Nurse Midwife | Admitting: Certified Nurse Midwife

## 2017-09-21 ENCOUNTER — Encounter (HOSPITAL_COMMUNITY): Payer: Self-pay

## 2017-09-21 DIAGNOSIS — O99322 Drug use complicating pregnancy, second trimester: Secondary | ICD-10-CM | POA: Diagnosis not present

## 2017-09-21 DIAGNOSIS — Z3A27 27 weeks gestation of pregnancy: Secondary | ICD-10-CM | POA: Diagnosis not present

## 2017-09-21 DIAGNOSIS — O99332 Smoking (tobacco) complicating pregnancy, second trimester: Secondary | ICD-10-CM | POA: Diagnosis not present

## 2017-09-21 DIAGNOSIS — O99212 Obesity complicating pregnancy, second trimester: Secondary | ICD-10-CM | POA: Insufficient documentation

## 2017-09-21 DIAGNOSIS — O24919 Unspecified diabetes mellitus in pregnancy, unspecified trimester: Secondary | ICD-10-CM | POA: Diagnosis present

## 2017-09-21 DIAGNOSIS — O24410 Gestational diabetes mellitus in pregnancy, diet controlled: Secondary | ICD-10-CM | POA: Insufficient documentation

## 2017-09-21 DIAGNOSIS — Z6838 Body mass index (BMI) 38.0-38.9, adult: Secondary | ICD-10-CM | POA: Insufficient documentation

## 2017-09-21 DIAGNOSIS — O10012 Pre-existing essential hypertension complicating pregnancy, second trimester: Secondary | ICD-10-CM | POA: Diagnosis not present

## 2017-09-25 ENCOUNTER — Ambulatory Visit (HOSPITAL_COMMUNITY)
Admission: EM | Admit: 2017-09-25 | Discharge: 2017-09-25 | Disposition: A | Discharge status: Home / Self Care | Payer: Medicaid Other | Attending: Family Medicine | Admitting: Family Medicine

## 2017-09-25 ENCOUNTER — Ambulatory Visit (INDEPENDENT_AMBULATORY_CARE_PROVIDER_SITE_OTHER): Payer: Medicaid Other | Admitting: Obstetrics

## 2017-09-25 ENCOUNTER — Encounter (HOSPITAL_COMMUNITY): Payer: Self-pay | Admitting: Family Medicine

## 2017-09-25 ENCOUNTER — Other Ambulatory Visit: Payer: Self-pay

## 2017-09-25 ENCOUNTER — Encounter: Payer: Self-pay | Admitting: Obstetrics

## 2017-09-25 DIAGNOSIS — M545 Low back pain, unspecified: Secondary | ICD-10-CM

## 2017-09-25 DIAGNOSIS — O360930 Maternal care for other rhesus isoimmunization, third trimester, not applicable or unspecified: Secondary | ICD-10-CM

## 2017-09-25 DIAGNOSIS — Z23 Encounter for immunization: Secondary | ICD-10-CM

## 2017-09-25 DIAGNOSIS — O24410 Gestational diabetes mellitus in pregnancy, diet controlled: Secondary | ICD-10-CM

## 2017-09-25 DIAGNOSIS — O0993 Supervision of high risk pregnancy, unspecified, third trimester: Secondary | ICD-10-CM

## 2017-09-25 DIAGNOSIS — O0990 Supervision of high risk pregnancy, unspecified, unspecified trimester: Secondary | ICD-10-CM

## 2017-09-25 MED ORDER — RHO D IMMUNE GLOBULIN 1500 UNIT/2ML IJ SOSY
300.0000 ug | PREFILLED_SYRINGE | Freq: Once | INTRAMUSCULAR | Status: AC
  Administered 2017-09-25: 300 ug via INTRAMUSCULAR
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
## 2017-09-25 NOTE — Discharge Instructions (Signed)
[facility] if [facility].  Tylenol for low back pain

## 2017-09-26 LAB — CBC
HEMOGLOBIN: 11.4 g/dL (ref 11.1–15.9)
Hematocrit: 33.5 % — ABNORMAL LOW (ref 34.0–46.6)
MCH: 29.5 pg (ref 26.6–33.0)
MCHC: 34 g/dL (ref 31.5–35.7)
MCV: 87 fL (ref 79–97)
Platelets: 307 10*3/uL (ref 150–379)
RBC: 3.87 x10E6/uL (ref 3.77–5.28)
RDW: 13.7 % (ref 12.3–15.4)
WBC: 10.2 10*3/uL (ref 3.4–10.8)

## 2017-09-26 LAB — HIV ANTIBODY (ROUTINE TESTING W REFLEX): HIV Screen 4th Generation wRfx: NONREACTIVE

## 2017-09-26 LAB — RPR: RPR: NONREACTIVE

## 2017-10-10 ENCOUNTER — Encounter: Payer: Self-pay | Admitting: Obstetrics

## 2017-10-18 ENCOUNTER — Encounter: Payer: Self-pay | Admitting: Obstetrics

## 2017-10-18 ENCOUNTER — Ambulatory Visit (INDEPENDENT_AMBULATORY_CARE_PROVIDER_SITE_OTHER): Payer: Medicaid Other | Admitting: Obstetrics

## 2017-10-18 VITALS — BP 146/86 | HR 112 | Wt 214.2 lb

## 2017-10-18 DIAGNOSIS — O0993 Supervision of high risk pregnancy, unspecified, third trimester: Secondary | ICD-10-CM

## 2017-10-18 DIAGNOSIS — O0990 Supervision of high risk pregnancy, unspecified, unspecified trimester: Secondary | ICD-10-CM

## 2017-10-18 DIAGNOSIS — O24410 Gestational diabetes mellitus in pregnancy, diet controlled: Secondary | ICD-10-CM

## 2017-10-18 NOTE — Progress Notes (Signed)
Subjective:  Olivia [name] is a 32 y.o. G3P0020 at 3066w4d being seen today for ongoing prenatal care.  She is currently monitored for the following issues for this high-risk pregnancy and has Supervision of high risk pregnancy, antepartum; MDD (major depressive disorder), recurrent severe, without psychosis (HCC); Obesity affecting pregnancy in second trimester; Late prenatal care affecting pregnancy; Tobacco use affecting pregnancy, antepartum; Low vitamin D level; Rh negative state in antepartum period; Elevated hemoglobin A1c; Cocaine use complicating pregnancy, second trimester; Recurrent acute suppurative otitis media of right ear without spontaneous rupture of tympanic membrane; and Diabetes in pregnancy on their problem list.  Patient reports no complaints.  Contractions: Not present.  .  Movement: Present. Denies leaking of fluid.   The following portions of the patient's history were reviewed and updated as appropriate: allergies, current medications, past family history, past medical history, past social history, past surgical history and problem list. Problem list updated.  Objective:   Vitals:   10/18/17 1540  BP: (!) 146/86  Pulse: (!) 112  Weight: 214 lb 3.2 oz (97.2 kg)    Fetal Status: Fetal Heart Rate (bpm): 140   Movement: Present     General:  Alert, oriented and cooperative. Patient is in no acute distress.  Skin: Skin is warm and dry. No rash noted.   Cardiovascular: Normal heart rate noted  Respiratory: Normal respiratory effort, no problems with respiration noted  Abdomen: Soft, gravid, appropriate for gestational age. Pain/Pressure: Absent     Pelvic:  Cervical exam deferred        Extremities: Normal range of motion.  Edema: None  Mental Status: Normal mood and affect. Normal behavior. Normal judgment and thought content.   Urinalysis:      Assessment and Plan:  Pregnancy: G3P0020 at 5266w4d  1. Supervision of high risk pregnancy, antepartum  2. GDM  (gestational diabetes mellitus), class A1   Preterm labor symptoms and general obstetric precautions including but not limited to vaginal bleeding, contractions, leaking of fluid and fetal movement were reviewed in detail with the patient. Please refer to After Visit Summary for other counseling recommendations.  Return in about 2 weeks (around 11/01/2017) for ROB.   Brock BadHarper, Kayman Snuffer A, MD

## 2017-10-19 ENCOUNTER — Ambulatory Visit (HOSPITAL_COMMUNITY): Admission: RE | Admit: 2017-10-19 | Payer: Medicaid Other | Source: Ambulatory Visit

## 2017-10-25 ENCOUNTER — Other Ambulatory Visit (HOSPITAL_COMMUNITY): Payer: Self-pay | Admitting: Maternal and Fetal Medicine

## 2017-10-25 ENCOUNTER — Ambulatory Visit (HOSPITAL_COMMUNITY)
Admission: RE | Admit: 2017-10-25 | Discharge: 2017-10-25 | Disposition: A | Discharge status: Home / Self Care | Payer: Medicaid Other | Source: Ambulatory Visit | Attending: Certified Nurse Midwife | Admitting: Certified Nurse Midwife

## 2017-10-25 ENCOUNTER — Encounter (HOSPITAL_COMMUNITY): Payer: Self-pay

## 2017-10-25 DIAGNOSIS — O24913 Unspecified diabetes mellitus in pregnancy, third trimester: Secondary | ICD-10-CM | POA: Insufficient documentation

## 2017-10-25 DIAGNOSIS — O24919 Unspecified diabetes mellitus in pregnancy, unspecified trimester: Secondary | ICD-10-CM

## 2017-10-25 DIAGNOSIS — Z3A32 32 weeks gestation of pregnancy: Secondary | ICD-10-CM

## 2017-10-25 DIAGNOSIS — O99210 Obesity complicating pregnancy, unspecified trimester: Secondary | ICD-10-CM

## 2017-10-25 DIAGNOSIS — E669 Obesity, unspecified: Secondary | ICD-10-CM | POA: Diagnosis not present

## 2017-10-25 DIAGNOSIS — O99213 Obesity complicating pregnancy, third trimester: Secondary | ICD-10-CM | POA: Diagnosis not present

## 2017-11-01 ENCOUNTER — Encounter: Payer: Self-pay | Admitting: Obstetrics

## 2017-11-02 ENCOUNTER — Other Ambulatory Visit: Payer: Self-pay | Admitting: Family Medicine

## 2017-11-05 ENCOUNTER — Encounter: Payer: Self-pay | Admitting: Obstetrics

## 2017-11-07 ENCOUNTER — Other Ambulatory Visit: Payer: Self-pay | Admitting: Certified Nurse Midwife

## 2017-11-07 ENCOUNTER — Other Ambulatory Visit: Payer: Self-pay

## 2017-11-07 ENCOUNTER — Inpatient Hospital Stay (HOSPITAL_COMMUNITY)
Admission: AD | Admit: 2017-11-07 | Discharge: 2017-11-07 | Disposition: A | Discharge status: Home / Self Care | Payer: Medicaid Other | Source: Ambulatory Visit | Attending: Obstetrics and Gynecology | Admitting: Obstetrics and Gynecology

## 2017-11-07 DIAGNOSIS — O0990 Supervision of high risk pregnancy, unspecified, unspecified trimester: Secondary | ICD-10-CM

## 2017-11-07 DIAGNOSIS — O10013 Pre-existing essential hypertension complicating pregnancy, third trimester: Secondary | ICD-10-CM | POA: Diagnosis not present

## 2017-11-07 DIAGNOSIS — Z3A34 34 weeks gestation of pregnancy: Secondary | ICD-10-CM | POA: Insufficient documentation

## 2017-11-07 DIAGNOSIS — O10919 Unspecified pre-existing hypertension complicating pregnancy, unspecified trimester: Secondary | ICD-10-CM | POA: Diagnosis present

## 2017-11-07 DIAGNOSIS — O99330 Smoking (tobacco) complicating pregnancy, unspecified trimester: Secondary | ICD-10-CM

## 2017-11-07 DIAGNOSIS — O10913 Unspecified pre-existing hypertension complicating pregnancy, third trimester: Secondary | ICD-10-CM

## 2017-11-07 DIAGNOSIS — O99333 Smoking (tobacco) complicating pregnancy, third trimester: Secondary | ICD-10-CM | POA: Diagnosis not present

## 2017-11-07 DIAGNOSIS — J069 Acute upper respiratory infection, unspecified: Secondary | ICD-10-CM | POA: Diagnosis not present

## 2017-11-07 DIAGNOSIS — F17210 Nicotine dependence, cigarettes, uncomplicated: Secondary | ICD-10-CM | POA: Insufficient documentation

## 2017-11-07 DIAGNOSIS — Z7982 Long term (current) use of aspirin: Secondary | ICD-10-CM | POA: Diagnosis not present

## 2017-11-07 DIAGNOSIS — R51 Headache: Secondary | ICD-10-CM | POA: Diagnosis present

## 2017-11-07 DIAGNOSIS — O99513 Diseases of the respiratory system complicating pregnancy, third trimester: Secondary | ICD-10-CM | POA: Diagnosis not present

## 2017-11-07 HISTORY — DX: Unspecified pre-existing hypertension complicating pregnancy, unspecified trimester: O10.919

## 2017-11-07 LAB — URINALYSIS, ROUTINE W REFLEX MICROSCOPIC
Bilirubin Urine: NEGATIVE
GLUCOSE, UA: NEGATIVE mg/dL
HGB URINE DIPSTICK: NEGATIVE
KETONES UR: NEGATIVE mg/dL
Leukocytes, UA: NEGATIVE
Nitrite: NEGATIVE
PROTEIN: NEGATIVE mg/dL
Specific Gravity, Urine: 1.002 — ABNORMAL LOW (ref 1.005–1.030)
pH: 7 (ref 5.0–8.0)

## 2017-11-07 LAB — CBC
HCT: 34.2 % — ABNORMAL LOW (ref 36.0–46.0)
Hemoglobin: 11.5 g/dL — ABNORMAL LOW (ref 12.0–15.0)
MCH: 29.6 pg (ref 26.0–34.0)
MCHC: 33.6 g/dL (ref 30.0–36.0)
MCV: 88.1 fL (ref 78.0–100.0)
Platelets: 230 10*3/uL (ref 150–400)
RBC: 3.88 MIL/uL (ref 3.87–5.11)
RDW: 13.4 % (ref 11.5–15.5)
WBC: 9.9 10*3/uL (ref 4.0–10.5)

## 2017-11-07 LAB — COMPREHENSIVE METABOLIC PANEL
ALT: 13 U/L — ABNORMAL LOW (ref 14–54)
AST: 18 U/L (ref 15–41)
Albumin: 2.8 g/dL — ABNORMAL LOW (ref 3.5–5.0)
Alkaline Phosphatase: 174 U/L — ABNORMAL HIGH (ref 38–126)
Anion gap: 8 (ref 5–15)
BUN: 8 mg/dL (ref 6–20)
CO2: 20 mmol/L — ABNORMAL LOW (ref 22–32)
Calcium: 9 mg/dL (ref 8.9–10.3)
Chloride: 107 mmol/L (ref 101–111)
Creatinine, Ser: 0.47 mg/dL (ref 0.44–1.00)
GFR calc Af Amer: >60 mL/min (ref >60)
GFR calc non Af Amer: >60 mL/min (ref >60)
Glucose, Bld: 94 mg/dL (ref 65–99)
Potassium: 3.7 mmol/L (ref 3.5–5.1)
Sodium: 135 mmol/L (ref 135–145)
Total Bilirubin: 0.5 mg/dL (ref 0.3–1.2)
Total Protein: 6.9 g/dL (ref 6.5–8.1)

## 2017-11-07 LAB — PROTEIN / CREATININE RATIO, URINE
Creatinine, Urine: 24 mg/dL
Total Protein, Urine: <6 mg/dL

## 2017-11-07 MED ORDER — ACETAMINOPHEN 500 MG PO TABS
1000.0000 mg | ORAL_TABLET | Freq: Once | ORAL | Status: AC
  Administered 2017-11-07: 1000 mg via ORAL
  Filled 2017-11-07: qty 2

## 2017-11-07 NOTE — MAU Provider Note (Signed)
Chief Complaint:  Nasal Congestion; Headache; and Diarrhea   First Provider Initiated Contact with Patient 11/07/17 1239      HPI: Olivia [name] is a 32 y.o. G3P0020 at 66w3dwho presents to maternity admissions reporting cold and headache associated with cold. She reports being around her nephew that has been sick for the past week and also had an ear infection. She reports getting sick within the following days. She reports right ear pain and headache, she rates the pain 9/10- has taken tylenol 3267mfor the pain with no relief. Was nervous to take pain medication because she did not know what was safe to take during pregnancy for colds. She denies pregnancy related concerns. She denies abdominal pain or contractions. She reports good fetal movement, denies LOF, vaginal bleeding, vaginal itching/burning, urinary symptoms, dizziness, n/v, or fever/chills.    Past Medical History: Past Medical History:  Diagnosis Date  . Anxiety   . Bipolar 1 disorder ([place]  . Hypertension   . Migraine   . Ovarian cyst   . PTSD (post-traumatic stress disorder)     Past obstetric history: OB History  Gravida Para Term Preterm AB Living  3       2 0  SAB TAB Ectopic Multiple Live Births  1 1          # Outcome Date GA Lbr Len/2nd Weight Sex Delivery Anes PTL Lv  3 Current           2 TAB 2017          1 SAB 2016              Past Surgical History: Past Surgical History:  Procedure Laterality Date  . WISDOM TOOTH EXTRACTION      Family History: Family History  Problem Relation Age of Onset  . Hypertension Mother   . Migraines Mother   . Hypertension Father   . Cancer Father        prostate  . Diabetes Father   . Heart disease Maternal Grandmother   . Heart attack Maternal Grandmother 78    Social History: Social History   Tobacco Use  . Smoking status: Current Every Day Smoker    Packs/day: 0.50    Types: Cigarettes  . Smokeless tobacco: Never Used  Substance Use Topics  .  Alcohol use: Yes    Comment: socially  . Drug use: No    Allergies:  Allergies  Allergen Reactions  . Ibuprofen     Meds:  Medications Prior to Admission  Medication Sig Dispense Refill Last Dose  . ACCU-CHEK FASTCLIX LANCETS MISC 1 each by Percutaneous route 4 (four) times daily. 100 each 12 Taking  . amoxicillin-clavulanate (AUGMENTIN) 875-125 MG tablet Take 1 tablet by mouth 2 (two) times daily. (Patient not taking: Reported on 07/23/2017) 14 tablet 0 Not Taking  . aspirin 81 MG chewable tablet Chew 1 tablet (81 mg total) by mouth daily. 30 tablet 12 Taking  . Blood Glucose Monitoring Suppl (ACCU-CHEK GUIDE) w/Device KIT USE TO TEST BLOOD SUGARS FOUR TIMES DAILY AS DIRECTED 1 kit 0   . butalbital-acetaminophen-caffeine (FIORICET, ESGIC) 50-325-40 MG tablet Take 1-2 tablets by mouth every 6 (six) hours as needed. (Patient not taking: Reported on 08/16/2017) 45 tablet 0 Not Taking  . Doxylamine-Pyridoxine (DICLEGIS) 10-10 MG TBEC Take 1 tablet with breakfast and lunch.  Take 2 tablets at bedtime. 100 tablet 4 Taking  . FLUoxetine (PROZAC) 10 MG capsule Take 1 capsule (10 mg  total) by mouth daily. (Patient not taking: Reported on 07/23/2017) 30 capsule 0 Not Taking  . glucose blood (ACCU-CHEK GUIDE) test strip Use as instructed 50 each 12 Taking  . loratadine (CLARITIN) 10 MG tablet Take 1 tablet (10 mg total) daily by mouth. 30 tablet 11 Taking  . omeprazole (PRILOSEC) 20 MG capsule Take 1 capsule (20 mg total) 2 (two) times daily before a meal by mouth. 60 capsule 5 Taking  . ondansetron (ZOFRAN) 8 MG tablet Take 1 tablet (8 mg total) by mouth every 8 (eight) hours as needed for nausea or vomiting. 40 tablet 2 Taking  . Prenat-FeAsp-Meth-FA-DHA w/o A (PRENATE PIXIE) 10-0.6-0.4-200 MG CAPS Take 1 tablet by mouth daily. 30 capsule 12 Taking  . Prenatal Vit-Fe Fumarate-FA (MULTIVITAMIN-PRENATAL) 27-0.8 MG TABS tablet Take 1 tablet by mouth daily at 12 noon.   Taking  . QUEtiapine (SEROQUEL)  300 MG tablet Take 1 tablet (300 mg total) by mouth at bedtime. (Patient not taking: Reported on 06/20/2017) 30 tablet 0 Not Taking  . Vitamin D, Ergocalciferol, (DRISDOL) 50000 units CAPS capsule Take 1 capsule (50,000 Units total) by mouth every 7 (seven) days. 30 capsule 2 Taking    ROS:  Review of Systems  Constitutional: Negative.   HENT: Positive for congestion, ear pain and sneezing. Negative for ear discharge, sore throat and trouble swallowing.   Respiratory: Positive for cough. Negative for chest tightness, shortness of breath and wheezing.   Cardiovascular: Negative.   Gastrointestinal: Negative.   Genitourinary: Negative.   Musculoskeletal: Negative.   Skin: Negative.   Neurological: Positive for headaches.  Psychiatric/Behavioral: Negative.    I have reviewed patient's Past Medical Hx, Surgical Hx, Family Hx, Social Hx, medications and allergies.   Physical Exam   Patient Vitals for the past 24 hrs:  BP Temp Temp src Pulse Resp SpO2 Height Weight  11/07/17 1500 (!) 144/76 - - 93 - - - -  11/07/17 1430 140/90 - - 95 - - - -  11/07/17 1418 - - - - - 99 % - -  11/07/17 1413 - - - - - 99 % - -  11/07/17 1408 - - - - - 99 % - -  11/07/17 1403 131/81 - - (!) 103 - 99 % - -  11/07/17 1300 (!) 130/91 - - (!) 111 - - - -  11/07/17 1259 - - - - - 99 % - -  11/07/17 1252 - - - - - 100 % - -  11/07/17 1247 - - - - - 99 % - -  11/07/17 1246 101/80 - - (!) 117 - - - -  11/07/17 1242 - - - - - 99 % - -  11/07/17 1240 - - - - - 99 % - -  11/07/17 1231 (!) 130/92 - - (!) 112 - - - -  11/07/17 1230 - - - - - 100 % - -  11/07/17 1228 - - - - - - 5' 1"  (1.549 m) 220 lb 4 oz (99.9 kg)  11/07/17 1225 (!) 145/91 97.9 F (36.6 C) Oral (!) 114 18 - - -   Constitutional: Well-developed, obese female in no acute distress.  Ear: gray tympanic membrane, no bulging, no pain on manipulation, no redness noted  Throat: No erythema or lesions on tongue Cardiovascular: normal rate Respiratory:  normal effort GI: Abd soft, non-tender, gravid appropriate for gestational age.  MS: Extremities nontender, no edema, normal ROM, DTR +1, no clonus  Neurologic: Alert and oriented  x 4.  GU: Neg CVAT.  Cervical Exam:  Dilation: Closed Effacement (%): 50 Presentation: Vertex Exam by:: Verdene Lennert, CNM  FHT:  Baseline 120 , moderate variability, accelerations present, no decelerations Contractions: q 3 mins/ mild/ 30-60 seconds   Labs: Results for orders placed or performed during the hospital encounter of 11/07/17 (from the past 24 hour(s))  Protein / creatinine ratio, urine     Status: None   Collection Time: 11/07/17 11:20 AM  Result Value Ref Range   Creatinine, Urine 24.00 mg/dL   Total Protein, Urine <6 mg/dL   Protein Creatinine Ratio        0.00 - 0.15 mg/mg[Cre]  Urinalysis, Routine w reflex microscopic     Status: Abnormal   Collection Time: 11/07/17 12:21 PM  Result Value Ref Range   Color, Urine STRAW (A) YELLOW   APPearance CLEAR CLEAR   Specific Gravity, Urine 1.002 (L) 1.005 - 1.030   pH 7.0 5.0 - 8.0   Glucose, UA NEGATIVE NEGATIVE mg/dL   Hgb urine dipstick NEGATIVE NEGATIVE   Bilirubin Urine NEGATIVE NEGATIVE   Ketones, ur NEGATIVE NEGATIVE mg/dL   Protein, ur NEGATIVE NEGATIVE mg/dL   Nitrite NEGATIVE NEGATIVE   Leukocytes, UA NEGATIVE NEGATIVE  CBC     Status: Abnormal   Collection Time: 11/07/17  1:44 PM  Result Value Ref Range   WBC 9.9 4.0 - 10.5 K/uL   RBC 3.88 3.87 - 5.11 MIL/uL   Hemoglobin 11.5 (L) 12.0 - 15.0 g/dL   HCT 34.2 (L) 36.0 - 46.0 %   MCV 88.1 78.0 - 100.0 fL   MCH 29.6 26.0 - 34.0 pg   MCHC 33.6 30.0 - 36.0 g/dL   RDW 13.4 11.5 - 15.5 %   Platelets 230 150 - 400 K/uL  Comprehensive metabolic panel     Status: Abnormal   Collection Time: 11/07/17  1:44 PM  Result Value Ref Range   Sodium 135 135 - 145 mmol/L   Potassium 3.7 3.5 - 5.1 mmol/L   Chloride 107 101 - 111 mmol/L   CO2 20 (L) 22 - 32 mmol/L   Glucose, Bld 94 65 - 99  mg/dL   BUN 8 6 - 20 mg/dL   Creatinine, Ser 0.47 0.44 - 1.00 mg/dL   Calcium 9.0 8.9 - 10.3 mg/dL   Total Protein 6.9 6.5 - 8.1 g/dL   Albumin 2.8 (L) 3.5 - 5.0 g/dL   AST 18 15 - 41 U/L   ALT 13 (L) 14 - 54 U/L   Alkaline Phosphatase 174 (H) 38 - 126 U/L   Total Bilirubin 0.5 0.3 - 1.2 mg/dL   GFR calc non Af Amer >60 >60 mL/min   GFR calc Af Amer >60 >60 mL/min   Anion gap 8 5 - 15   O/Negative/-- (09/12 1132)  MAU Course/MDM: Orders Placed This Encounter  Procedures  . Urinalysis, Routine w reflex microscopic  . CBC  . Comprehensive metabolic panel  . Protein / creatinine ratio, urine  . Encourage fluids  . Discharge patient Discharge disposition: 01-Home or Self Care; Discharge patient date: 11/07/2017  UA- showed decreased SP, encourage fluids to decrease UI and UC  Meds ordered this encounter  Medications  . acetaminophen (TYLENOL) tablet 1,000 mg   NST reviewed  Treatments in MAU included 1,020m Tylenol for HA- pt reports decreased pain with HA after treatment, currently rates HA 3/10.   Pt has a history of hypertension prior to pregnancy and during prenatal appointments. Diagnosed today  based on review of records, prenatal visits, and today's visit. Hypertension prior to pregnancy included below values of 10/06/15: 138/96. 08/15/16: 121/107. 08/16/16: 136/96 and 09/22/16: 141/84 . At 33w4don 10/18/17 her BP was 146/86. She is not currently on medication for hypertension was taken off HCTZ at initial prenatal per patient.   Discussed s/s of super imposed preeclampsia. Baseline labs drawn today in MAU. Pre-E labs negative today. Pt discharge with strict preeclamsia precautions. Pt stable at time of discharge.   Today's evaluation included a work-up for preterm labor which can be life-threatening for both mom and baby.  Assessment: 1. Chronic hypertension during pregnancy, antepartum   2. Upper respiratory tract infection, unspecified type     Plan: Discharge  home Preterm Labor precautions and fetal kick counts Follow up as scheduled in the office  Return to MAU as needed for s/s of preeclampsia or PTL   Allergies as of 11/07/2017      Reactions   Ibuprofen       Medication List    TAKE these medications   ACCU-CHEK FASTCLIX LANCETS Misc 1 each by Percutaneous route 4 (four) times daily.   ACCU-CHEK GUIDE w/Device Kit USE TO TEST BLOOD SUGARS FOUR TIMES DAILY AS DIRECTED   amoxicillin-clavulanate 875-125 MG tablet Commonly known as:  AUGMENTIN Take 1 tablet by mouth 2 (two) times daily.   aspirin 81 MG chewable tablet Chew 1 tablet (81 mg total) by mouth daily.   butalbital-acetaminophen-caffeine 50-325-40 MG tablet Commonly known as:  FIORICET, ESGIC Take 1-2 tablets by mouth every 6 (six) hours as needed.   Doxylamine-Pyridoxine 10-10 MG Tbec Commonly known as:  DICLEGIS Take 1 tablet with breakfast and lunch.  Take 2 tablets at bedtime.   FLUoxetine 10 MG capsule Commonly known as:  PROZAC Take 1 capsule (10 mg total) by mouth daily.   glucose blood test strip Commonly known as:  ACCU-CHEK GUIDE Use as instructed   loratadine 10 MG tablet Commonly known as:  CLARITIN Take 1 tablet (10 mg total) daily by mouth.   multivitamin-prenatal 27-0.8 MG Tabs tablet Take 1 tablet by mouth daily at 12 noon.   omeprazole 20 MG capsule Commonly known as:  PRILOSEC Take 1 capsule (20 mg total) 2 (two) times daily before a meal by mouth.   ondansetron 8 MG tablet Commonly known as:  ZOFRAN Take 1 tablet (8 mg total) by mouth every 8 (eight) hours as needed for nausea or vomiting.   PRENATE PIXIE 10-0.6-0.4-200 MG Caps Take 1 tablet by mouth daily.   QUEtiapine 300 MG tablet Commonly known as:  SEROQUEL Take 1 tablet (300 mg total) by mouth at bedtime.   Vitamin D (Ergocalciferol) 50000 units Caps capsule Commonly known as:  DRISDOL Take 1 capsule (50,000 Units total) by mouth every 7 (seven) days.       VDarrol PokeCertified Nurse-Midwife 11/07/2017 4:05 PM

## 2017-11-07 NOTE — MAU Note (Signed)
Pitcher of water given to patient for PO hydration.

## 2017-11-07 NOTE — MAU Note (Addendum)
Pt. Here due to cold and chest congested.  Per patient, "my ears are stopped up" and I have a headache.  Positive for fetal movement, denies sudden gush of fluid, denies vaginal bleeding. Pain 9/10, due to headache - per patient.  EFM applied - FHR 130s, Toco applied - abd. Soft.  Per patient, every body in her household is sick with a cold.

## 2017-11-12 ENCOUNTER — Ambulatory Visit (INDEPENDENT_AMBULATORY_CARE_PROVIDER_SITE_OTHER): Payer: Medicaid Other | Admitting: Obstetrics

## 2017-11-12 ENCOUNTER — Encounter: Payer: Self-pay | Admitting: Obstetrics

## 2017-11-12 ENCOUNTER — Other Ambulatory Visit (HOSPITAL_COMMUNITY)
Admission: RE | Admit: 2017-11-12 | Discharge: 2017-11-12 | Disposition: A | Discharge status: Home / Self Care | Payer: Medicaid Other | Source: Ambulatory Visit | Attending: Obstetrics | Admitting: Obstetrics

## 2017-11-12 VITALS — BP 124/89 | HR 108 | Wt 219.5 lb

## 2017-11-12 DIAGNOSIS — O0993 Supervision of high risk pregnancy, unspecified, third trimester: Secondary | ICD-10-CM

## 2017-11-12 DIAGNOSIS — Z3A35 35 weeks gestation of pregnancy: Secondary | ICD-10-CM | POA: Diagnosis not present

## 2017-11-12 DIAGNOSIS — O24410 Gestational diabetes mellitus in pregnancy, diet controlled: Secondary | ICD-10-CM

## 2017-11-12 DIAGNOSIS — O0990 Supervision of high risk pregnancy, unspecified, unspecified trimester: Secondary | ICD-10-CM

## 2017-11-12 LAB — OB RESULTS CONSOLE GBS: STREP GROUP B AG: POSITIVE

## 2017-11-12 NOTE — Addendum Note (Signed)
Addended by: Natale MilchSTALLING, BRITTANY D on: 11/12/2017 04:43 PM   Modules accepted: Orders

## 2017-11-12 NOTE — Progress Notes (Signed)
Subjective:  Olivia [name] is a 32 y.o. G3P0020 at 8543w1d being seen today for ongoing prenatal care.  She is currently monitored for the following issues for this high-risk pregnancy and has Supervision of high risk pregnancy, antepartum; MDD (major depressive disorder), recurrent severe, without psychosis (HCC); Obesity affecting pregnancy in second trimester; Late prenatal care affecting pregnancy; Tobacco use affecting pregnancy, antepartum; Low vitamin D level; Rh negative state in antepartum period; Elevated hemoglobin A1c; Cocaine use complicating pregnancy, second trimester; Recurrent acute suppurative otitis media of right ear without spontaneous rupture of tympanic membrane; Diabetes in pregnancy; and Chronic hypertension during pregnancy, antepartum on their problem list.  Patient reports no complaints.  Contractions: Irregular. Vag. Bleeding: None.  Movement: Present. Denies leaking of fluid.   The following portions of the patient's history were reviewed and updated as appropriate: allergies, current medications, past family history, past medical history, past social history, past surgical history and problem list. Problem list updated.  Objective:   Vitals:   11/12/17 1412  BP: 124/89  Pulse: (!) 108  Weight: 219 lb 8 oz (99.6 kg)    Fetal Status: Fetal Heart Rate (bpm): nst   Movement: Present     General:  Alert, oriented and cooperative. Patient is in no acute distress.  Skin: Skin is warm and dry. No rash noted.   Cardiovascular: Normal heart rate noted  Respiratory: Normal respiratory effort, no problems with respiration noted  Abdomen: Soft, gravid, appropriate for gestational age. Pain/Pressure: Absent     Pelvic:  Cervical exam deferred        Extremities: Normal range of motion.  Edema: None  Mental Status: Normal mood and affect. Normal behavior. Normal judgment and thought content.   Urinalysis:      Assessment and Plan:  Pregnancy: G3P0020 at 3143w1d  1.  Supervision of high risk pregnancy, antepartum Rx - Strep Gp B NAA - Cervicovaginal ancillary only  2. GDM (gestational diabetes mellitus), class A1 - ultrasound for growth scheduled  NST:  NonReactive.  Fair Variability.  Baseline FHR 150's.  No 15x15 accels.  No decels.  Uterine irritability.  [facility]-Reactive NST Preterm labor symptoms and general obstetric precautions including but not limited to vaginal bleeding, contractions, leaking of fluid and fetal movement were reviewed in detail with the patient. Please refer to After Visit Summary for other counseling recommendations.  Return in about 1 week (around 11/19/2017) for ROB.   Brock BadHarper, Charles A, MD

## 2017-11-12 NOTE — Progress Notes (Signed)
Patient reports good fetal movement with some irregular contractions. 

## 2017-11-13 ENCOUNTER — Inpatient Hospital Stay (HOSPITAL_COMMUNITY): Payer: Medicaid Other

## 2017-11-13 ENCOUNTER — Encounter (HOSPITAL_COMMUNITY): Payer: Self-pay | Admitting: *Deleted

## 2017-11-13 ENCOUNTER — Inpatient Hospital Stay (HOSPITAL_COMMUNITY)
Admission: AD | Admit: 2017-11-13 | Discharge: 2017-11-13 | Disposition: A | Discharge status: Home / Self Care | Payer: Medicaid Other | Source: Ambulatory Visit | Attending: Obstetrics and Gynecology | Admitting: Obstetrics and Gynecology

## 2017-11-13 DIAGNOSIS — F17210 Nicotine dependence, cigarettes, uncomplicated: Secondary | ICD-10-CM | POA: Diagnosis not present

## 2017-11-13 DIAGNOSIS — O10013 Pre-existing essential hypertension complicating pregnancy, third trimester: Secondary | ICD-10-CM | POA: Diagnosis not present

## 2017-11-13 DIAGNOSIS — O24419 Gestational diabetes mellitus in pregnancy, unspecified control: Secondary | ICD-10-CM | POA: Diagnosis not present

## 2017-11-13 DIAGNOSIS — Z3689 Encounter for other specified antenatal screening: Secondary | ICD-10-CM

## 2017-11-13 DIAGNOSIS — O99333 Smoking (tobacco) complicating pregnancy, third trimester: Secondary | ICD-10-CM | POA: Insufficient documentation

## 2017-11-13 DIAGNOSIS — Z7982 Long term (current) use of aspirin: Secondary | ICD-10-CM | POA: Insufficient documentation

## 2017-11-13 DIAGNOSIS — O288 Other abnormal findings on antenatal screening of mother: Secondary | ICD-10-CM

## 2017-11-13 DIAGNOSIS — Z3A35 35 weeks gestation of pregnancy: Secondary | ICD-10-CM | POA: Insufficient documentation

## 2017-11-13 HISTORY — DX: Gestational diabetes mellitus in pregnancy, unspecified control: O24.419

## 2017-11-13 LAB — CERVICOVAGINAL ANCILLARY ONLY
Bacterial vaginitis: NEGATIVE
CANDIDA VAGINITIS: NEGATIVE
CHLAMYDIA, DNA PROBE: NEGATIVE
Neisseria Gonorrhea: NEGATIVE
Trichomonas: NEGATIVE

## 2017-11-13 LAB — URINALYSIS, ROUTINE W REFLEX MICROSCOPIC
Bilirubin Urine: NEGATIVE
Glucose, UA: NEGATIVE mg/dL
HGB URINE DIPSTICK: NEGATIVE
KETONES UR: NEGATIVE mg/dL
Leukocytes, UA: NEGATIVE
Nitrite: NEGATIVE
PROTEIN: NEGATIVE mg/dL
Specific Gravity, Urine: 1.002 — ABNORMAL LOW (ref 1.005–1.030)
pH: 6 (ref 5.0–8.0)

## 2017-11-13 NOTE — MAU Note (Signed)
Pt presents to MAU stating that she needs to have fetal monitoring. States she had an appointment with Dr Clearance CootsHarper yesterday and was told to come for monitoring and U/S yesterday, but waited until today because it was so late yesterday evening when she left the office. Denies any VB, LOF or contractions

## 2017-11-13 NOTE — MAU Provider Note (Signed)
History     CSN: [id number]  Arrival date and time: 11/13/17 1058   First Provider Initiated Contact with Patient 11/13/17 1135      Chief Complaint  Patient presents with  . fetal monitoring   HPI Ms. Olivia [name] is a 32 y.o. G3P0020 at 73w2dwho presents to MAU today for NST and BPP. She was seen in the office yesterday for routine prenatal care visit with NST and had a non-reactive tracing. She was advised to come to MAU at that time for BPP and additional monitoring. She felt it was too late and did not want to come yesterday. She is here now. She has CHTN and GDM and a history of cocaine use. She denies vaginal bleeding, LOF or contractions. She reports normal fetal movement.   OB History    Gravida Para Term Preterm AB Living   3       2 0   SAB TAB Ectopic Multiple Live Births   1 1            Past Medical History:  Diagnosis Date  . Anxiety   . Bipolar 1 disorder ([place]   . Gestational diabetes   . Hypertension   . Migraine   . Ovarian cyst   . PTSD (post-traumatic stress disorder)     Past Surgical History:  Procedure Laterality Date  . WISDOM TOOTH EXTRACTION      Family History  Problem Relation Age of Onset  . Hypertension Mother   . Migraines Mother   . Hypertension Father   . Cancer Father        prostate  . Diabetes Father   . Heart disease Maternal Grandmother   . Heart attack Maternal Grandmother 78    Social History   Tobacco Use  . Smoking status: Current Every Day Smoker    Packs/day: 0.50    Types: Cigarettes  . Smokeless tobacco: Never Used  Substance Use Topics  . Alcohol use: Yes    Comment: socially  . Drug use: No    Allergies:  Allergies  Allergen Reactions  . Ibuprofen Anaphylaxis and Swelling    Medications Prior to Admission  Medication Sig Dispense Refill Last Dose  . acetaminophen (TYLENOL) 500 MG tablet Take 500 mg by mouth every 6 (six) hours as needed for mild pain or headache.   Past Week at Unknown time   . aspirin 81 MG chewable tablet Chew 1 tablet (81 mg total) by mouth daily. 30 tablet 12 11/13/2017 at Unknown time  . calcium carbonate (TUMS - DOSED IN MG ELEMENTAL CALCIUM) 500 MG chewable tablet Chew 1 tablet by mouth 2 (two) times daily as needed for indigestion or heartburn.   Past Week at Unknown time  . Doxylamine-Pyridoxine (DICLEGIS) 10-10 MG TBEC Take 1 tablet with breakfast and lunch.  Take 2 tablets at bedtime. 100 tablet 4 11/13/2017 at Unknown time  . loratadine (CLARITIN) 10 MG tablet Take 1 tablet (10 mg total) daily by mouth. (Patient taking differently: Take 10 mg by mouth daily as needed for allergies. ) 30 tablet 11 Past Week at Unknown time  . omeprazole (PRILOSEC) 20 MG capsule Take 1 capsule (20 mg total) 2 (two) times daily before a meal by mouth. 60 capsule 5 Past Week at Unknown time  . ondansetron (ZOFRAN) 8 MG tablet Take 1 tablet (8 mg total) by mouth every 8 (eight) hours as needed for nausea or vomiting. 40 tablet 2 prn  . Prenat-FeAsp-Meth-FA-DHA w/o A (  PRENATE PIXIE) 10-0.6-0.4-200 MG CAPS Take 1 tablet by mouth daily. 30 capsule 12 11/13/2017 at Unknown time  . ACCU-CHEK FASTCLIX LANCETS MISC 1 each by Percutaneous route 4 (four) times daily. 100 each 12 Taking  . amoxicillin-clavulanate (AUGMENTIN) 875-125 MG tablet Take 1 tablet by mouth 2 (two) times daily. (Patient not taking: Reported on 07/23/2017) 14 tablet 0 Not Taking  . Blood Glucose Monitoring Suppl (ACCU-CHEK GUIDE) w/Device KIT USE TO TEST BLOOD SUGARS FOUR TIMES DAILY AS DIRECTED 1 kit 0 Taking  . butalbital-acetaminophen-caffeine (FIORICET, ESGIC) 50-325-40 MG tablet Take 1-2 tablets by mouth every 6 (six) hours as needed. (Patient not taking: Reported on 08/16/2017) 45 tablet 0 Not Taking  . FLUoxetine (PROZAC) 10 MG capsule Take 1 capsule (10 mg total) by mouth daily. (Patient not taking: Reported on 07/23/2017) 30 capsule 0 Not Taking  . glucose blood (ACCU-CHEK GUIDE) test strip Use as instructed 50 each  12 Taking  . QUEtiapine (SEROQUEL) 300 MG tablet Take 1 tablet (300 mg total) by mouth at bedtime. (Patient not taking: Reported on 06/20/2017) 30 tablet 0 Not Taking  . Vitamin D, Ergocalciferol, (DRISDOL) 50000 units CAPS capsule Take 1 capsule (50,000 Units total) by mouth every 7 (seven) days. (Patient taking differently: Take 50,000 Units by mouth every 7 (seven) days. mondays) 30 capsule 2 11/12/2017    Review of Systems  Constitutional: Negative for fever.  Gastrointestinal: Negative for abdominal pain.  Genitourinary: Negative for vaginal bleeding and vaginal discharge.   Physical Exam   Blood pressure 117/79, pulse (!) 105, temperature 98.7 F (37.1 C), resp. rate 18, weight 219 lb (99.3 kg), last menstrual period 03/11/2017, SpO2 99 %, unknown if currently breastfeeding.  Physical Exam  Nursing note and vitals reviewed. Constitutional: She is oriented to person, place, and time. She appears well-developed and well-nourished. No distress.  HENT:  Head: Normocephalic and atraumatic.  Cardiovascular: Normal rate.  Respiratory: Effort normal.  GI: Soft. She exhibits no distension and no mass. There is no tenderness. There is no rebound and no guarding.  Neurological: She is alert and oriented to person, place, and time.  Skin: Skin is warm and dry. No erythema.  Psychiatric: She has a normal mood and affect.    Results for orders placed or performed during the hospital encounter of 11/13/17 (from the past 24 hour(s))  Urinalysis, Routine w reflex microscopic     Status: Abnormal   Collection Time: 11/13/17 10:45 AM  Result Value Ref Range   Color, Urine STRAW (A) YELLOW   APPearance CLEAR CLEAR   Specific Gravity, Urine 1.002 (L) 1.005 - 1.030   pH 6.0 5.0 - 8.0   Glucose, UA NEGATIVE NEGATIVE mg/dL   Hgb urine dipstick NEGATIVE NEGATIVE   Bilirubin Urine NEGATIVE NEGATIVE   Ketones, ur NEGATIVE NEGATIVE mg/dL   Protein, ur NEGATIVE NEGATIVE mg/dL   Nitrite NEGATIVE  NEGATIVE   Leukocytes, UA NEGATIVE NEGATIVE    Fetal Monitoring: Baseline: 140 bpm Variability: moderate Accelerations: 15 x 15 Decelerations:  none Contractions: moderate UI, few irregular contractions noted, mild  MAU Course  Procedures None  MDM UA and BPP today BPP 8/8  Assessment and Plan  A: SIUP at 67w2dH/o cocaine use GDM  CHTN Reactive NST   P:  Discharge home Kick counts and labor precautions discussed Patient advised to [place] [facility] [place] [facility] [place] Patient may return to [facility]U as needed or if her condition were  to change or worsen  Kerry Hough , PA-C 11/13/2017, 12:34 PM

## 2017-11-13 NOTE — Discharge Instructions (Signed)
Braxton Hicks Contractions °Contractions of the uterus can occur throughout pregnancy, but they are not always a sign that you are in labor. You may have practice contractions called Braxton Hicks contractions. These false labor contractions are sometimes confused with true labor. °What are Braxton Hicks contractions? °Braxton Hicks contractions are tightening movements that occur in the muscles of the uterus before labor. Unlike true labor contractions, these contractions do not result in opening (dilation) and thinning of the cervix. Toward the end of pregnancy (32-34 weeks), Braxton Hicks contractions can happen more often and may become stronger. These contractions are sometimes difficult to tell apart from true labor because they can be very uncomfortable. You should not feel embarrassed if you go to the hospital with false labor. °Sometimes, the only way to tell if you are in true labor is for your health care provider to look for changes in the cervix. The health care provider will do a physical exam and may monitor your contractions. If you are not in true labor, the exam should show that your cervix is not dilating and your water has not broken. °If there are other health problems associated with your pregnancy, it is completely safe for you to be sent home with false labor. You may continue to have Braxton Hicks contractions until you go into true labor. °How to tell the difference between true labor and false labor °True labor °· Contractions last 30-70 seconds. °· Contractions become very regular. °· Discomfort is usually felt in the top of the uterus, and it spreads to the lower abdomen and low back. °· Contractions do not go away with walking. °· Contractions usually become more intense and increase in frequency. °· The cervix dilates and gets thinner. °False labor °· Contractions are usually shorter and not as strong as true labor contractions. °· Contractions are usually irregular. °· Contractions  are often felt in the front of the lower abdomen and in the groin. °· Contractions may go away when you walk around or change positions while lying down. °· Contractions get weaker and are shorter-lasting as time goes on. °· The cervix usually does not dilate or become thin. °Follow these instructions at home: °· Take over-the-counter and prescription medicines only as told by your health care provider. °· Keep up with your usual exercises and follow other instructions from your health care provider. °· Eat and drink lightly if you think you are going into labor. °· If Braxton Hicks contractions are making you uncomfortable: °? Change your position from lying down or resting to walking, or change from walking to resting. °? Sit and rest in a tub of warm water. °? Drink enough fluid to keep your urine pale yellow. Dehydration may cause these contractions. °? Do slow and deep breathing several times an hour. °· Keep all follow-up prenatal visits as told by your health care provider. This is important. °Contact a health care provider if: °· You have a fever. °· You have continuous pain in your abdomen. °Get help right away if: °· Your contractions become stronger, more regular, and closer together. °· You have fluid leaking or gushing from your vagina. °· You pass blood-tinged mucus (bloody show). °· You have bleeding from your vagina. °· You have low back pain that you never had before. °· You feel your baby’s head pushing down and causing pelvic pressure. °· Your baby is not moving inside you as much as it used to. °Summary °· Contractions that occur before labor are called Braxton   Hicks contractions, false labor, or practice contractions. °· Braxton Hicks contractions are usually shorter, weaker, farther apart, and less regular than true labor contractions. True labor contractions usually become progressively stronger and regular and they become more frequent. °· Manage discomfort from Braxton Hicks contractions by  changing position, resting in a warm bath, drinking plenty of water, or practicing deep breathing. °This information is not intended to replace advice given to you by your health care provider. Make sure you discuss any questions you have with your health care provider. °Document Released: 02/08/2017 Document Revised: 02/08/2017 Document Reviewed: 02/08/2017 °[employer] Interactive Patient Education © 2018 [employer] ° °Fetal Movement Counts °Patient Name: ________________________________________________ Patient Due Date: ____________________ °What is a fetal movement count? °A fetal movement count is the number of times that you feel your baby move during a certain amount of time. This may also be called a fetal kick count. A fetal movement count is recommended for every pregnant woman. You may be asked to start counting fetal movements as early as week 28 of your pregnancy. °Pay attention to when your baby is most active. You may notice your baby's sleep and wake cycles. You may also notice things that make your baby move more. You should do a fetal movement count: °· When your baby is normally most active. °· At the same time each day. ° °A good time to count movements is while you are resting, after having something to eat and drink. °How do I count fetal movements? °1. Find a quiet, comfortable area. Sit, or lie down on your side. °2. Write down the date, the start time and stop time, and the number of movements that you felt between those two times. Take this information with you to your health care visits. °3. For 2 hours, count kicks, flutters, swishes, rolls, and jabs. You should feel at least 10 movements during 2 hours. °4. You may stop counting after you have felt 10 movements. °5. If you do not feel 10 movements in 2 hours, have something to eat and drink. Then, keep resting and counting for 1 hour. If you feel at least 4 movements during that hour, you may stop counting. °Contact a health care  provider if: °· You feel fewer than 4 movements in 2 hours. °· Your baby is not moving like he or she usually does. °Date: ____________ Start time: ____________ Stop time: ____________ Movements: ____________ °Date: ____________ Start time: ____________ Stop time: ____________ Movements: ____________ °Date: ____________ Start time: ____________ Stop time: ____________ Movements: ____________ °Date: ____________ Start time: ____________ Stop time: ____________ Movements: ____________ °Date: ____________ Start time: ____________ Stop time: ____________ Movements: ____________ °Date: ____________ Start time: ____________ Stop time: ____________ Movements: ____________ °Date: ____________ Start time: ____________ Stop time: ____________ Movements: ____________ °Date: ____________ Start time: ____________ Stop time: ____________ Movements: ____________ °Date: ____________ Start time: ____________ Stop time: ____________ Movements: ____________ °This information is not intended to replace advice given to you by your health care provider. Make sure you discuss any questions you have with your health care provider. °Document Released: 10/25/2006 Document Revised: 05/24/2016 Document Reviewed: 11/04/2015 °[employer] Interactive Patient Education © 2018 [employer] ° °

## 2017-11-14 ENCOUNTER — Encounter (HOSPITAL_COMMUNITY): Payer: Self-pay

## 2017-11-14 LAB — STREP GP B NAA: Strep Gp B NAA: POSITIVE — AB

## 2017-11-16 ENCOUNTER — Ambulatory Visit (HOSPITAL_COMMUNITY): Admission: RE | Admit: 2017-11-16 | Payer: Medicaid Other | Source: Ambulatory Visit

## 2017-11-16 HISTORY — DX: Cocaine abuse, uncomplicated: F14.10

## 2017-11-19 ENCOUNTER — Encounter: Payer: Self-pay | Admitting: Obstetrics

## 2017-11-20 ENCOUNTER — Encounter (HOSPITAL_COMMUNITY): Payer: Self-pay | Admitting: Psychiatry

## 2017-11-20 ENCOUNTER — Ambulatory Visit (INDEPENDENT_AMBULATORY_CARE_PROVIDER_SITE_OTHER): Payer: Medicaid Other | Admitting: Psychiatry

## 2017-11-20 VITALS — BP 126/78 | HR 98 | Ht 62.0 in | Wt 225.0 lb

## 2017-11-20 DIAGNOSIS — F4310 Post-traumatic stress disorder, unspecified: Secondary | ICD-10-CM

## 2017-11-20 DIAGNOSIS — Z3A35 35 weeks gestation of pregnancy: Secondary | ICD-10-CM | POA: Diagnosis not present

## 2017-11-20 DIAGNOSIS — Z62810 Personal history of physical and sexual abuse in childhood: Secondary | ICD-10-CM

## 2017-11-20 DIAGNOSIS — Z818 Family history of other mental and behavioral disorders: Secondary | ICD-10-CM

## 2017-11-20 DIAGNOSIS — F3160 Bipolar disorder, current episode mixed, unspecified: Secondary | ICD-10-CM

## 2017-11-20 DIAGNOSIS — Z634 Disappearance and death of family member: Secondary | ICD-10-CM

## 2017-11-20 DIAGNOSIS — Z560 Unemployment, unspecified: Secondary | ICD-10-CM

## 2017-11-20 DIAGNOSIS — F17210 Nicotine dependence, cigarettes, uncomplicated: Secondary | ICD-10-CM

## 2017-11-20 DIAGNOSIS — Z915 Personal history of self-harm: Secondary | ICD-10-CM
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
## 2017-11-22 ENCOUNTER — Ambulatory Visit (HOSPITAL_COMMUNITY)
Admission: RE | Admit: 2017-11-22 | Discharge: 2017-11-22 | Disposition: A | Discharge status: Home / Self Care | Payer: Medicaid Other | Source: Ambulatory Visit | Attending: Obstetrics | Admitting: Obstetrics

## 2017-11-22 ENCOUNTER — Encounter (HOSPITAL_COMMUNITY): Payer: Self-pay

## 2017-11-22 ENCOUNTER — Other Ambulatory Visit (HOSPITAL_COMMUNITY): Payer: Self-pay | Admitting: Maternal and Fetal Medicine

## 2017-11-22 ENCOUNTER — Encounter: Payer: Self-pay | Admitting: Obstetrics

## 2017-11-22 ENCOUNTER — Ambulatory Visit (INDEPENDENT_AMBULATORY_CARE_PROVIDER_SITE_OTHER): Payer: Medicaid Other | Admitting: Obstetrics

## 2017-11-22 VITALS — BP 144/89 | HR 97 | Wt 227.0 lb

## 2017-11-22 DIAGNOSIS — O24410 Gestational diabetes mellitus in pregnancy, diet controlled: Secondary | ICD-10-CM

## 2017-11-22 DIAGNOSIS — O99323 Drug use complicating pregnancy, third trimester: Secondary | ICD-10-CM

## 2017-11-22 DIAGNOSIS — O10913 Unspecified pre-existing hypertension complicating pregnancy, third trimester: Secondary | ICD-10-CM | POA: Diagnosis not present

## 2017-11-22 DIAGNOSIS — O99213 Obesity complicating pregnancy, third trimester: Secondary | ICD-10-CM | POA: Insufficient documentation

## 2017-11-22 DIAGNOSIS — Z3A36 36 weeks gestation of pregnancy: Secondary | ICD-10-CM

## 2017-11-22 DIAGNOSIS — O10013 Pre-existing essential hypertension complicating pregnancy, third trimester: Secondary | ICD-10-CM | POA: Diagnosis present

## 2017-11-22 DIAGNOSIS — O24919 Unspecified diabetes mellitus in pregnancy, unspecified trimester: Secondary | ICD-10-CM

## 2017-11-22 DIAGNOSIS — O10919 Unspecified pre-existing hypertension complicating pregnancy, unspecified trimester: Secondary | ICD-10-CM

## 2017-11-22 NOTE — Progress Notes (Signed)
Subjective:  Olivia [name] is [name] 32 y.o. G3P0020 at 7285w4d being seen today for ongoing prenatal care.  She is currently monitored for the following issues for this high-risk pregnancy and has Supervision of high risk pregnancy, antepartum; MDD (major depressive disorder), recurrent severe, without psychosis (HCC); Obesity affecting pregnancy in second trimester; Late prenatal care affecting pregnancy; Tobacco use affecting pregnancy, antepartum; Low vitamin D level; Rh negative state in antepartum period; Elevated hemoglobin A1c; Cocaine use complicating pregnancy, second trimester; Recurrent acute suppurative otitis media of right ear without spontaneous rupture of tympanic membrane; Diabetes in pregnancy; and Chronic hypertension during pregnancy, antepartum on their problem list.  Patient reports no complaints.  Contractions: Not present. Vag. Bleeding: None.  Movement: Present. Denies leaking of fluid.   The following portions of the patient's history were reviewed and updated as appropriate: allergies, current medications, past family history, past medical history, past social history, past surgical history and problem list. Problem list updated.  Objective:   Vitals:   11/22/17 1440  BP: (!) 144/89  Pulse: 97  Weight: 227 lb (103 kg)    Fetal Status: Fetal Heart Rate (bpm): NST-R   Movement: Present     General:  Alert, oriented and cooperative. Patient is in no acute distress.  Skin: Skin is warm and dry. No rash noted.   Cardiovascular: Normal heart rate noted  Respiratory: Normal respiratory effort, no problems with respiration noted  Abdomen: Soft, gravid, appropriate for gestational age. Pain/Pressure: Absent     Pelvic:  Cervical exam deferred        Extremities: Normal range of motion.  Edema: None  Mental Status: Normal mood and affect. Normal behavior. Normal judgment and thought content.   Urinalysis:      Assessment and Plan:  Pregnancy: G3P0020 at 8585w4d  1. Chronic  hypertension during pregnancy, antepartum Rx: - Fetal nonstress test:  Reactive.  Good variability.  15x15 accels.  No decels.  No UC's.  2. Gestational Diabetes Mellitus, Class 1-[name], Diet Controlled - good glucose control  Term labor symptoms and general obstetric precautions including but not limited to vaginal bleeding, contractions, leaking of fluid and fetal movement were reviewed in detail with the patient. Please refer to After Visit Summary for other counseling recommendations.  Return in about 1 week (around 11/29/2017) for ROB.   Brock BadHarper, Olivia [name] A, MD

## 2017-11-26 ENCOUNTER — Encounter: Payer: Self-pay | Admitting: Obstetrics

## 2017-11-29 ENCOUNTER — Ambulatory Visit (INDEPENDENT_AMBULATORY_CARE_PROVIDER_SITE_OTHER): Payer: Medicaid Other | Admitting: Obstetrics

## 2017-11-29 ENCOUNTER — Encounter: Payer: Self-pay | Admitting: Obstetrics

## 2017-11-29 VITALS — BP 139/92 | HR 96 | Wt 225.3 lb

## 2017-11-29 DIAGNOSIS — O24410 Gestational diabetes mellitus in pregnancy, diet controlled: Secondary | ICD-10-CM | POA: Diagnosis not present

## 2017-11-29 DIAGNOSIS — O10919 Unspecified pre-existing hypertension complicating pregnancy, unspecified trimester: Secondary | ICD-10-CM

## 2017-11-29 DIAGNOSIS — O10913 Unspecified pre-existing hypertension complicating pregnancy, third trimester: Secondary | ICD-10-CM

## 2017-11-29 DIAGNOSIS — O0993 Supervision of high risk pregnancy, unspecified, third trimester: Secondary | ICD-10-CM

## 2017-11-29 DIAGNOSIS — O0990 Supervision of high risk pregnancy, unspecified, unspecified trimester: Secondary | ICD-10-CM

## 2017-11-29 NOTE — Progress Notes (Signed)
Patient reports good fetal movement, denies pain. Pt reports BG 80 today and usually ranges from 80-125.

## 2017-11-29 NOTE — Progress Notes (Signed)
Subjective:  Olivia [name] is a 32 y.o. G3P0020 at 451w4d being seen today for ongoing prenatal care.  She is currently monitored for the following issues for this high-risk pregnancy and has Supervision of high risk pregnancy, antepartum; MDD (major depressive disorder), recurrent severe, without psychosis (HCC); Obesity affecting pregnancy in second trimester; Late prenatal care affecting pregnancy; Tobacco use affecting pregnancy, antepartum; Low vitamin D level; Rh negative state in antepartum period; Elevated hemoglobin A1c; Cocaine use complicating pregnancy, second trimester; Recurrent acute suppurative otitis media of right ear without spontaneous rupture of tympanic membrane; Diabetes in pregnancy; and Chronic hypertension during pregnancy, antepartum on their problem list.  Patient reports no complaints.  Contractions: Not present. Vag. Bleeding: None.  Movement: Present. Denies leaking of fluid.   The following portions of the patient's history were reviewed and updated as appropriate: allergies, current medications, past family history, past medical history, past social history, past surgical history and problem list. Problem list updated.  Objective:   Vitals:   11/29/17 1459 11/29/17 1501  BP: (!) 144/96 (!) 139/92  Pulse: 99 96  Weight: 225 lb 4.8 oz (102.2 kg)     Fetal Status: Fetal Heart Rate (bpm): NSR   Movement: Present     General:  Alert, oriented and cooperative. Patient is in no acute distress.  Skin: Skin is warm and dry. No rash noted.   Cardiovascular: Normal heart rate noted  Respiratory: Normal respiratory effort, no problems with respiration noted  Abdomen: Soft, gravid, appropriate for gestational age. Pain/Pressure: Absent     Pelvic:  Cervical exam deferred        Extremities: Normal range of motion.  Edema: None  Mental Status: Normal mood and affect. Normal behavior. Normal judgment and thought content.   Urinalysis:      NST:  Reactive.  Baseline  FHR 150's.  Good variability.  15x15 accels.  No decels.  Assessment and Plan:  Pregnancy: G3P0020 at 781w4d  1. Supervision of high risk pregnancy, antepartum  2. GDM (gestational diabetes mellitus), class A1 - good glucose control, between 80-120  3. Chronic hypertension during pregnancy, antepartum - stable  Term labor symptoms and general obstetric precautions including but not limited to vaginal bleeding, contractions, leaking of fluid and fetal movement were reviewed in detail with the patient. Please refer to After Visit Summary for other counseling recommendations.  Return in about 1 week (around 12/06/2017) for ROB.   Brock BadHarper, Charles A, MD

## 2017-12-04 ENCOUNTER — Encounter (HOSPITAL_COMMUNITY): Payer: Self-pay | Admitting: *Deleted

## 2017-12-04 ENCOUNTER — Inpatient Hospital Stay (HOSPITAL_COMMUNITY)
Admission: AD | Admit: 2017-12-04 | Discharge: 2017-12-04 | Disposition: A | Discharge status: Home / Self Care | Payer: Medicaid Other | Source: Ambulatory Visit | Attending: Family Medicine | Admitting: Family Medicine

## 2017-12-04 DIAGNOSIS — O10919 Unspecified pre-existing hypertension complicating pregnancy, unspecified trimester: Secondary | ICD-10-CM

## 2017-12-04 DIAGNOSIS — O0990 Supervision of high risk pregnancy, unspecified, unspecified trimester: Secondary | ICD-10-CM

## 2017-12-04 DIAGNOSIS — O99330 Smoking (tobacco) complicating pregnancy, unspecified trimester: Secondary | ICD-10-CM

## 2017-12-04 MED ORDER — ZOLPIDEM TARTRATE 5 MG PO TABS
5.0000 mg | ORAL_TABLET | Freq: Once | ORAL | Status: AC
  Administered 2017-12-04: 5 mg via ORAL
  Filled 2017-12-04: qty 1

## 2017-12-04 MED ORDER — SIMETHICONE 80 MG PO CHEW
80.0000 mg | CHEWABLE_TABLET | Freq: Once | ORAL | Status: AC
  Administered 2017-12-04: 80 mg via ORAL
  Filled 2017-12-04: qty 1

## 2017-12-04 NOTE — MAU Note (Signed)
Pt reports contractions since 0430 am, light pink discharge, reports good fetal meovement

## 2017-12-05 ENCOUNTER — Encounter (HOSPITAL_COMMUNITY): Payer: Self-pay

## 2017-12-05 ENCOUNTER — Inpatient Hospital Stay (HOSPITAL_COMMUNITY)
Admission: AD | Admit: 2017-12-05 | Discharge: 2017-12-09 | DRG: 786 | Disposition: A | Discharge status: Home / Self Care | Payer: Medicaid Other | Source: Ambulatory Visit | Attending: Obstetrics and Gynecology | Admitting: Obstetrics and Gynecology

## 2017-12-05 ENCOUNTER — Inpatient Hospital Stay (HOSPITAL_COMMUNITY): Payer: Medicaid Other | Admitting: Anesthesiology

## 2017-12-05 DIAGNOSIS — F1490 Cocaine use, unspecified, uncomplicated: Secondary | ICD-10-CM | POA: Diagnosis present

## 2017-12-05 DIAGNOSIS — O26899 Other specified pregnancy related conditions, unspecified trimester: Secondary | ICD-10-CM

## 2017-12-05 DIAGNOSIS — O904 Postpartum acute kidney failure: Secondary | ICD-10-CM | POA: Diagnosis present

## 2017-12-05 DIAGNOSIS — O1002 Pre-existing essential hypertension complicating childbirth: Secondary | ICD-10-CM | POA: Diagnosis present

## 2017-12-05 DIAGNOSIS — O4290 Premature rupture of membranes, unspecified as to length of time between rupture and onset of labor, unspecified weeks of gestation: Secondary | ICD-10-CM | POA: Diagnosis present

## 2017-12-05 DIAGNOSIS — O99324 Drug use complicating childbirth: Secondary | ICD-10-CM | POA: Diagnosis present

## 2017-12-05 DIAGNOSIS — Z6791 Unspecified blood type, Rh negative: Secondary | ICD-10-CM | POA: Diagnosis not present

## 2017-12-05 DIAGNOSIS — O24420 Gestational diabetes mellitus in childbirth, diet controlled: Principal | ICD-10-CM | POA: Diagnosis present

## 2017-12-05 DIAGNOSIS — F17210 Nicotine dependence, cigarettes, uncomplicated: Secondary | ICD-10-CM | POA: Diagnosis present

## 2017-12-05 DIAGNOSIS — Z98891 History of uterine scar from previous surgery: Secondary | ICD-10-CM

## 2017-12-05 DIAGNOSIS — O99212 Obesity complicating pregnancy, second trimester: Secondary | ICD-10-CM | POA: Diagnosis present

## 2017-12-05 DIAGNOSIS — N179 Acute kidney failure, unspecified: Secondary | ICD-10-CM

## 2017-12-05 DIAGNOSIS — E669 Obesity, unspecified: Secondary | ICD-10-CM | POA: Diagnosis present

## 2017-12-05 DIAGNOSIS — Z3A38 38 weeks gestation of pregnancy: Secondary | ICD-10-CM

## 2017-12-05 DIAGNOSIS — O99330 Smoking (tobacco) complicating pregnancy, unspecified trimester: Secondary | ICD-10-CM | POA: Diagnosis present

## 2017-12-05 DIAGNOSIS — O99214 Obesity complicating childbirth: Secondary | ICD-10-CM | POA: Diagnosis present

## 2017-12-05 DIAGNOSIS — O10919 Unspecified pre-existing hypertension complicating pregnancy, unspecified trimester: Secondary | ICD-10-CM | POA: Diagnosis present

## 2017-12-05 DIAGNOSIS — O4292 Full-term premature rupture of membranes, unspecified as to length of time between rupture and onset of labor: Secondary | ICD-10-CM | POA: Diagnosis present

## 2017-12-05 DIAGNOSIS — Z3483 Encounter for supervision of other normal pregnancy, third trimester: Secondary | ICD-10-CM | POA: Diagnosis present

## 2017-12-05 DIAGNOSIS — O24919 Unspecified diabetes mellitus in pregnancy, unspecified trimester: Secondary | ICD-10-CM | POA: Diagnosis present

## 2017-12-05 DIAGNOSIS — O0930 Supervision of pregnancy with insufficient antenatal care, unspecified trimester: Secondary | ICD-10-CM

## 2017-12-05 DIAGNOSIS — O99824 Streptococcus B carrier state complicating childbirth: Secondary | ICD-10-CM | POA: Diagnosis present

## 2017-12-05 DIAGNOSIS — O1092 Unspecified pre-existing hypertension complicating childbirth: Secondary | ICD-10-CM | POA: Diagnosis not present

## 2017-12-05 DIAGNOSIS — O99334 Smoking (tobacco) complicating childbirth: Secondary | ICD-10-CM | POA: Diagnosis present

## 2017-12-05 DIAGNOSIS — O339 Maternal care for disproportion, unspecified: Secondary | ICD-10-CM | POA: Diagnosis not present

## 2017-12-05 DIAGNOSIS — F1411 Cocaine abuse, in remission: Secondary | ICD-10-CM | POA: Diagnosis present

## 2017-12-05 DIAGNOSIS — O26893 Other specified pregnancy related conditions, third trimester: Secondary | ICD-10-CM | POA: Diagnosis present

## 2017-12-05 DIAGNOSIS — O99322 Drug use complicating pregnancy, second trimester: Secondary | ICD-10-CM | POA: Diagnosis present

## 2017-12-05 HISTORY — DX: Premature rupture of membranes, unspecified as to length of time between rupture and onset of labor, unspecified weeks of gestation: O42.90

## 2017-12-05 LAB — CBC
HCT: 35.8 % — ABNORMAL LOW (ref 36.0–46.0)
Hemoglobin: 12 g/dL (ref 12.0–15.0)
MCH: 30.1 pg (ref 26.0–34.0)
MCHC: 33.5 g/dL (ref 30.0–36.0)
MCV: 89.7 fL (ref 78.0–100.0)
PLATELETS: 237 10*3/uL (ref 150–400)
RBC: 3.99 MIL/uL (ref 3.87–5.11)
RDW: 13.8 % (ref 11.5–15.5)
WBC: 14.5 10*3/uL — AB (ref 4.0–10.5)

## 2017-12-05 LAB — COMPREHENSIVE METABOLIC PANEL
ALBUMIN: 3.2 g/dL — AB (ref 3.5–5.0)
ALT: 20 U/L (ref 14–54)
ANION GAP: 10 (ref 5–15)
AST: 29 U/L (ref 15–41)
Alkaline Phosphatase: 199 U/L — ABNORMAL HIGH (ref 38–126)
BUN: 9 mg/dL (ref 6–20)
CHLORIDE: 104 mmol/L (ref 101–111)
CO2: 21 mmol/L — ABNORMAL LOW (ref 22–32)
Calcium: 9.1 mg/dL (ref 8.9–10.3)
Creatinine, Ser: 0.95 mg/dL (ref 0.44–1.00)
GFR calc Af Amer: >60 mL/min (ref >60)
GLUCOSE: 85 mg/dL (ref 65–99)
Potassium: 4.1 mmol/L (ref 3.5–5.1)
Sodium: 135 mmol/L (ref 135–145)
TOTAL PROTEIN: 7.4 g/dL (ref 6.5–8.1)
Total Bilirubin: 0.5 mg/dL (ref 0.3–1.2)

## 2017-12-05 LAB — RAPID URINE DRUG SCREEN, HOSP PERFORMED
AMPHETAMINES: NOT DETECTED
BENZODIAZEPINES: NOT DETECTED
Barbiturates: NOT DETECTED
Cocaine: POSITIVE — AB
OPIATES: POSITIVE — AB
TETRAHYDROCANNABINOL: NOT DETECTED

## 2017-12-05 LAB — TYPE AND SCREEN
ABO/RH(D): O NEG
ANTIBODY SCREEN: NEGATIVE

## 2017-12-05 LAB — PROTEIN / CREATININE RATIO, URINE
Creatinine, Urine: 85 mg/dL
Protein Creatinine Ratio: 0.15 mg/mg{Cre} (ref 0.00–0.15)
TOTAL PROTEIN, URINE: 13 mg/dL

## 2017-12-05 MED ORDER — OXYTOCIN BOLUS FROM INFUSION: 500.0000 mL | Freq: Once | INTRAVENOUS | Status: DC

## 2017-12-05 MED ORDER — FENTANYL CITRATE (PF) 100 MCG/2ML IJ SOLN: 100.0000 ug | INTRAMUSCULAR | Status: DC | PRN

## 2017-12-05 MED ORDER — ACETAMINOPHEN 325 MG PO TABS: 650.0000 mg | ORAL_TABLET | ORAL | Status: DC | PRN

## 2017-12-05 MED ORDER — SODIUM CHLORIDE 0.9 % IV SOLN
5.0000 10*6.[IU] | Freq: Once | INTRAVENOUS | Status: AC
  Administered 2017-12-05: 5 10*6.[IU] via INTRAVENOUS
  Filled 2017-12-05: qty 5

## 2017-12-05 MED ORDER — LIDOCAINE HCL (PF) 1 % IJ SOLN
INTRAMUSCULAR | Status: DC | PRN
  Administered 2017-12-05 (×2): 5 mL

## 2017-12-05 MED ORDER — DIPHENHYDRAMINE HCL 50 MG/ML IJ SOLN
12.5000 mg | INTRAMUSCULAR | Status: DC | PRN
  Administered 2017-12-06 (×2): 12.5 mg via INTRAVENOUS
  Filled 2017-12-05: qty 1

## 2017-12-05 MED ORDER — PENICILLIN G POT IN DEXTROSE 60000 UNIT/ML IV SOLN
3.0000 10*6.[IU] | INTRAVENOUS | Status: DC
  Administered 2017-12-06 (×4): 3 10*6.[IU] via INTRAVENOUS
  Filled 2017-12-05 (×6): qty 50

## 2017-12-05 MED ORDER — OXYCODONE-ACETAMINOPHEN 5-325 MG PO TABS: 2.0000 | ORAL_TABLET | ORAL | Status: DC | PRN

## 2017-12-05 MED ORDER — EPHEDRINE 5 MG/ML INJ: 10.0000 mg | INTRAVENOUS | Status: DC | PRN

## 2017-12-05 MED ORDER — PHENYLEPHRINE 40 MCG/ML (10ML) SYRINGE FOR IV PUSH (FOR BLOOD PRESSURE SUPPORT): 80.0000 ug | PREFILLED_SYRINGE | INTRAVENOUS | Status: DC | PRN

## 2017-12-05 MED ORDER — FLEET ENEMA 7-19 GM/118ML RE ENEM: 1.0000 | ENEMA | RECTAL | Status: DC | PRN

## 2017-12-05 MED ORDER — PHENYLEPHRINE 40 MCG/ML (10ML) SYRINGE FOR IV PUSH (FOR BLOOD PRESSURE SUPPORT)
80.0000 ug | PREFILLED_SYRINGE | INTRAVENOUS | Status: DC | PRN
  Filled 2017-12-05: qty 10

## 2017-12-05 MED ORDER — LACTATED RINGERS IV SOLN
INTRAVENOUS | Status: DC
  Administered 2017-12-05 – 2017-12-06 (×3): via INTRAVENOUS

## 2017-12-05 MED ORDER — ONDANSETRON HCL 4 MG/2ML IJ SOLN: 4.0000 mg | Freq: Four times a day (QID) | INTRAMUSCULAR | Status: DC | PRN

## 2017-12-05 MED ORDER — OXYTOCIN 40 UNITS IN LACTATED RINGERS INFUSION - SIMPLE MED: 2.5000 [IU]/h | INTRAVENOUS | Status: DC

## 2017-12-05 MED ORDER — LIDOCAINE HCL (PF) 1 % IJ SOLN
30.0000 mL | INTRAMUSCULAR | Status: DC | PRN
  Filled 2017-12-05: qty 30

## 2017-12-05 MED ORDER — OXYCODONE-ACETAMINOPHEN 5-325 MG PO TABS: 1.0000 | ORAL_TABLET | ORAL | Status: DC | PRN

## 2017-12-05 MED ORDER — SOD CITRATE-CITRIC ACID 500-334 MG/5ML PO SOLN
30.0000 mL | ORAL | Status: DC | PRN
  Administered 2017-12-06: 30 mL via ORAL
  Filled 2017-12-05: qty 15

## 2017-12-05 MED ORDER — LACTATED RINGERS IV SOLN: 500.0000 mL | Freq: Once | INTRAVENOUS | Status: DC

## 2017-12-05 MED ORDER — FENTANYL 2.5 MCG/ML BUPIVACAINE 1/10 % EPIDURAL INFUSION (WH - ANES)
14.0000 mL/h | INTRAMUSCULAR | Status: DC | PRN
  Administered 2017-12-05 – 2017-12-06 (×3): 14 mL/h via EPIDURAL
  Filled 2017-12-05 (×3): qty 100

## 2017-12-05 MED ORDER — LACTATED RINGERS IV SOLN: 500.0000 mL | INTRAVENOUS | Status: DC | PRN

## 2017-12-05 NOTE — Anesthesia Procedure Notes (Signed)
Epidural Patient location during procedure: OB  Staffing Anesthesiologist: Shiann Kam, MD Performed: anesthesiologist   Preanesthetic Checklist Completed: patient identified, site marked, surgical consent, pre-op evaluation, timeout performed, IV checked, risks and benefits discussed and monitors and equipment checked  Epidural Patient position: sitting Prep: DuraPrep Patient monitoring: heart rate, continuous pulse ox and blood pressure Approach: right paramedian Location: L3-L4 Injection technique: LOR saline  Needle:  Needle type: Tuohy  Needle gauge: 17 G Needle length: 9 cm and 9 Needle insertion depth: 6 cm Catheter type: closed end flexible Catheter size: 20 Guage Catheter at skin depth: 10 cm Test dose: negative  Assessment Events: blood not aspirated, injection not painful, no injection resistance, negative IV test and no paresthesia  Additional Notes Patient identified. Risks/Benefits/Options discussed with patient including but not limited to bleeding, infection, nerve damage, paralysis, failed block, incomplete pain control, headache, blood pressure changes, nausea, vomiting, reactions to medication both or allergic, itching and postpartum back pain. Confirmed with bedside nurse the patient's most recent platelet count. Confirmed with patient that they are not currently taking any anticoagulation, have any bleeding history or any family history of bleeding disorders. Patient expressed understanding and wished to proceed. All questions were answered. Sterile technique was used throughout the entire procedure. Please see nursing notes for vital signs. Test dose was given through epidural needle and negative prior to continuing to dose epidural or start infusion. Warning signs of high block given to the patient including shortness of breath, tingling/numbness in hands, complete motor block, or any concerning symptoms with instructions to call for help. Patient was given  instructions on fall risk and not to get out of bed. All questions and concerns addressed with instructions to call with any issues.     

## 2017-12-05 NOTE — Anesthesia Preprocedure Evaluation (Signed)
Anesthesia Evaluation  Patient identified by MRN, date of birth, ID band Patient awake    Reviewed: Allergy & Precautions, NPO status , Patient's Chart, lab work & pertinent test results  Airway Mallampati: II  TM Distance: >3 FB Neck ROM: Full    Dental no notable dental hx.    Pulmonary neg pulmonary ROS, Current Smoker,    Pulmonary exam normal breath sounds clear to auscultation       Cardiovascular hypertension, Normal cardiovascular exam Rhythm:Regular Rate:Normal     Neuro/Psych Anxiety Depression Bipolar Disorder negative neurological ROS     GI/Hepatic negative GI ROS, (+)     substance abuse  cocaine use,   Endo/Other  diabetes, Gestational  Renal/GU negative Renal ROS  negative genitourinary   Musculoskeletal negative musculoskeletal ROS (+)   Abdominal   Peds negative pediatric ROS (+)  Hematology negative hematology ROS (+)   Anesthesia Other Findings   Reproductive/Obstetrics (+) Pregnancy                             Anesthesia Physical Anesthesia Plan  ASA: III  Anesthesia Plan: Epidural   Post-op Pain Management:    Induction:   PONV Risk Score and Plan:   Airway Management Planned:   Additional Equipment:   Intra-op Plan:   Post-operative Plan:   Informed Consent: I have reviewed the patients History and Physical, chart, labs and discussed the procedure including the risks, benefits and alternatives for the proposed anesthesia with the patient or authorized representative who has indicated his/her understanding and acceptance.     Plan Discussed with:   Anesthesia Plan Comments:         Anesthesia Quick Evaluation

## 2017-12-05 NOTE — H&P (Signed)
LABOR AND DELIVERY ADMISSION HISTORY AND PHYSICAL NOTE  Olivia [name] is a 32 y.o. female G3P0020 with IUP at 49w3dby LMP c/w 19-wk U/S, who presented to MAU for contractions. She reports not being sure if her broke, but endorses some continuous watery discharge throughout the day today, and thinks this started around 6 am today.   Prenatal History/Complications: PNC at [facility] Pregnancy complications:  - Late PNC, starting at 14 weeks - Chronic HTN (not on any medications) - Gestational diabetes, diet controlled - Obesity - Tobacco use - Rh negative  Past Medical History: Past Medical History:  Diagnosis Date  . Anxiety   . [place]   . [place]   . Gestational diabetes   . Hypertension   . Migraine   . Ovarian cyst   . PTSD (post-traumatic stress disorder)     Past Surgical History: Past Surgical History:  Procedure Laterality Date  . WISDOM TOOTH EXTRACTION      Obstetrical History: OB History    Gravida Para Term Preterm AB Living   3       2 0   SAB TAB Ectopic Multiple Live Births   1 1            Social History: Social History   Socioeconomic History  . Marital status: Single    Spouse name: None  . Number of children: None  . Years of education: None  . Highest education level: Associate degree: occupational, tHotel manager or vocational program  Social Needs  . Financial resource strain: Somewhat hard  . Food insecurity - worry: Never true  . Food insecurity - inability: Never true  . Transportation needs - medical: No  . Transportation needs - non-medical: No  Occupational History  . None  Tobacco Use  . Smoking status: Current Every Day Smoker    Packs/day: 1.00    Types: Cigarettes  . Smokeless tobacco: Never Used  . Tobacco comment: Has cut down to 2 a day  Substance and Sexual Activity  . Alcohol use: Yes    Comment: socially  . Drug use: No    Comment: used cocaine on Monday   . Sexual activity: Not  Currently    Partners: Male    Birth control/protection: None  Other Topics Concern  . None  Social History Narrative   Patient [place] she is currently not working. She is pregnant with her first child.    Family History: Family History  Problem Relation Age of Onset  . Hypertension Mother   . Migraines Mother   . Anxiety disorder Mother   . Depression Mother   . Seizures Mother   . Hypertension Father   . Cancer Father        prostate  . Diabetes Father   . Anxiety disorder Father   . Bipolar disorder Father   . Depression Father   . OCD Father   . Heart disease Maternal Grandmother   . Heart attack Maternal Grandmother 78  . ADD / ADHD Brother   . Schizophrenia Maternal Uncle     Allergies: Allergies  Allergen Reactions  . Ibuprofen Anaphylaxis and Swelling    Medications Prior to Admission  Medication Sig Dispense Refill Last Dose  . ACCU-CHEK FASTCLIX LANCETS MISC 1 each by Percutaneous route 4 (four) times daily. 100 each 12 Taking  . acetaminophen (TYLENOL) 500 MG tablet Take 500 mg by mouth every 6 (six) hours as needed  for mild pain or headache.   Past Week at Unknown time  . aspirin 81 MG chewable tablet Chew 1 tablet (81 mg total) by mouth daily. 30 tablet 12 12/04/2017 at Unknown time  . Blood Glucose Monitoring Suppl (ACCU-CHEK GUIDE) w/Device KIT USE TO TEST BLOOD SUGARS FOUR TIMES DAILY AS DIRECTED 1 kit 0 Taking  . Doxylamine-Pyridoxine (DICLEGIS) 10-10 MG TBEC Take 1 tablet with breakfast and lunch.  Take 2 tablets at bedtime. 100 tablet 4 12/04/2017 at Unknown time  . glucose blood (ACCU-CHEK GUIDE) test strip Use as instructed 50 each 12 Taking  . loratadine (CLARITIN) 10 MG tablet Take 1 tablet (10 mg total) daily by mouth. (Patient taking differently: Take 10 mg by mouth daily as needed for allergies. ) 30 tablet 11 12/04/2017 at Unknown time  . omeprazole (PRILOSEC) 20 MG capsule Take 1 capsule (20 mg total) 2  (two) times daily before a meal by mouth. 60 capsule 5 12/04/2017 at Unknown time  . ondansetron (ZOFRAN) 8 MG tablet Take 1 tablet (8 mg total) by mouth every 8 (eight) hours as needed for nausea or vomiting. (Patient not taking: Reported on 12/04/2017) 40 tablet 2 Not Taking at Unknown time  . Prenat-FeAsp-Meth-FA-DHA w/o A (PRENATE PIXIE) 10-0.6-0.4-200 MG CAPS Take 1 tablet by mouth daily. 30 capsule 12 12/04/2017 at Unknown time  . Vitamin D, Ergocalciferol, (DRISDOL) 50000 units CAPS capsule Take 1 capsule (50,000 Units total) by mouth every 7 (seven) days. (Patient taking differently: Take 50,000 Units by mouth every 7 (seven) days. mondays) 30 capsule 2 Past Week at Unknown time     Review of Systems  All systems reviewed and negative except as stated in HPI  Physical Exam Blood pressure (!) 143/74, pulse 97, temperature 98 F (36.7 C), resp. rate 20, last menstrual period 03/11/2017, SpO2 100 %, unknown if currently breastfeeding. General appearance: alert, oriented. Anxious, otherwise in NAD Lungs: normal respiratory effort Heart: regular rate Abdomen: soft, non-tender; gravid, FH appropriate for GA Extremities: No calf swelling or tenderness Presentation: cephalic Fetal monitoring: baseline rate  125, moderate variability, +acel, no decel Uterine activity: irregular ctx Dilation: 4 Effacement (%): 100 Station: -1, 0 Exam by:: Maryagnes Amos RN/Dr. Ritter Helsley  Limited Bedside U/S: cephalic presentation; no fluid seen around baby.  Significantly distended bladder also noted on U/S. Pt unable to urinate. I/O cath yielded 1,400 ml of urine.  Speculum exam: no pooling in vaginal, but fetal head and hair seen through cervical os.  Prenatal labs: ABO, Rh: --/--/O NEG (02/27 2159) Antibody: PENDING (02/27 2159) Rubella: 1.58 (09/12 1132) RPR: Non Reactive (12/18 1403)  HBsAg: Negative (09/12 1132)  HIV: Non Reactive (12/18 1403)  GC/Chlamydia: negative (11/12/17) GBS: Positive  (02/04 1517)  2-hr GTT: positive (83, 195. 164) Genetic screening:  Normal NIPS Anatomy US: Normal anatomy at 19 weeks, female  Prenatal Transfer Tool  Maternal Diabetes: Yes:  Diabetes Type:  Diet controlled Genetic Screening: Normal Maternal Ultrasounds/Referrals: Normal Fetal Ultrasounds or other Referrals:  None Maternal Substance Abuse:  Yes:  Type: Cocaine Significant Maternal Medications:  Meds include: Other: ASA, omeprazole, Zofran Significant Maternal Lab Results: Lab values include: Group B Strep positive  Results for orders placed or performed during the hospital encounter of 12/05/17 (from the past 24 hour(s))  Urine rapid drug screen (hosp performed)   Collection Time: 12/05/17  9:50 PM  Result Value Ref Range   Opiates POSITIVE (A) NONE DETECTED   Cocaine POSITIVE (A) NONE DETECTED   Benzodiazepines NONE DETECTED  NONE DETECTED   Amphetamines NONE DETECTED NONE DETECTED   Tetrahydrocannabinol NONE DETECTED NONE DETECTED   Barbiturates NONE DETECTED NONE DETECTED  Protein / creatinine ratio, urine   Collection Time: 12/05/17  9:50 PM  Result Value Ref Range   Creatinine, Urine 85.00 mg/dL   Total Protein, Urine 13 mg/dL   Protein Creatinine Ratio 0.15 0.00 - 0.15 mg/mg[Cre]  CBC   Collection Time: 12/05/17  9:59 PM  Result Value Ref Range   WBC 14.5 (H) 4.0 - 10.5 K/uL   RBC 3.99 3.87 - 5.11 MIL/uL   Hemoglobin 12.0 12.0 - 15.0 g/dL   HCT 35.8 (L) 36.0 - 46.0 %   MCV 89.7 78.0 - 100.0 fL   MCH 30.1 26.0 - 34.0 pg   MCHC 33.5 30.0 - 36.0 g/dL   RDW 13.8 11.5 - 15.5 %   Platelets 237 150 - 400 K/uL  Comprehensive metabolic panel   Collection Time: 12/05/17  9:59 PM  Result Value Ref Range   Sodium 135 135 - 145 mmol/L   Potassium 4.1 3.5 - 5.1 mmol/L   Chloride 104 101 - 111 mmol/L   CO2 21 (L) 22 - 32 mmol/L   Glucose, Bld 85 65 - 99 mg/dL   BUN 9 6 - 20 mg/dL   Creatinine, Ser 0.95 0.44 - 1.00 mg/dL   Calcium 9.1 8.9 - 10.3 mg/dL   Total Protein 7.4 6.5  - 8.1 g/dL   Albumin 3.2 (L) 3.5 - 5.0 g/dL   AST 29 15 - 41 U/L   ALT 20 14 - 54 U/L   Alkaline Phosphatase 199 (H) 38 - 126 U/L   Total Bilirubin 0.5 0.3 - 1.2 mg/dL   GFR calc non Af Amer >60 >60 mL/min   GFR calc Af Amer >60 >60 mL/min   Anion gap 10 5 - 15  Type and screen [place]   Collection Time: 12/05/17  9:59 PM  Result Value Ref Range   ABO/RH(D) O NEG    Antibody Screen PENDING    Sample Expiration      12/08/2017 Performed at [facility], [address], [place], [place] [address]     Assessment: Olivia [name] is a 32 y.o. G3P0020 at 33w3dpresenting in early labor, also found to be ROM, likely PROM around 0600 today.   #Labor: Admit to L&D. Will recheck SVE, and augment with Pitocin if not making cervical change, since ROM ~0600  #Pain: Planning on getting epidural #FWB: Cat I #ID:  GBS pos, start IV PCN for prophylaxis #MOF: bottle #MOC: unsure, but reports she does not want to have any more babies; will discussed postpartum  #Hx of substance use: pt admits to using cocaine a few days ago. UDS obtained. SW consult postpartum  #CHTN: BPs mild range; no signs or sx of superimposed PreE Check CBC, CMP, UPC   #A1GDM: Monitor CBGs q 2h  JJenne PaneDegele 12/05/2017, 11:02 PM

## 2017-12-06 ENCOUNTER — Encounter (HOSPITAL_COMMUNITY)
Admission: AD | Disposition: A | Discharge status: Home / Self Care | Payer: Self-pay | Source: Ambulatory Visit | Attending: Obstetrics and Gynecology

## 2017-12-06 ENCOUNTER — Encounter: Payer: Self-pay | Admitting: Obstetrics and Gynecology

## 2017-12-06 ENCOUNTER — Encounter (HOSPITAL_COMMUNITY): Payer: Self-pay | Admitting: *Deleted

## 2017-12-06 DIAGNOSIS — O1092 Unspecified pre-existing hypertension complicating childbirth: Secondary | ICD-10-CM

## 2017-12-06 DIAGNOSIS — Z3A38 38 weeks gestation of pregnancy: Secondary | ICD-10-CM

## 2017-12-06 DIAGNOSIS — O339 Maternal care for disproportion, unspecified: Secondary | ICD-10-CM

## 2017-12-06 DIAGNOSIS — Z98891 History of uterine scar from previous surgery: Secondary | ICD-10-CM

## 2017-12-06 DIAGNOSIS — O99824 Streptococcus B carrier state complicating childbirth: Secondary | ICD-10-CM

## 2017-12-06 DIAGNOSIS — O24420 Gestational diabetes mellitus in childbirth, diet controlled: Secondary | ICD-10-CM

## 2017-12-06 LAB — CBC
HEMATOCRIT: 32.1 % — AB (ref 36.0–46.0)
HEMOGLOBIN: 10.9 g/dL — AB (ref 12.0–15.0)
MCH: 30.5 pg (ref 26.0–34.0)
MCHC: 34 g/dL (ref 30.0–36.0)
MCV: 89.9 fL (ref 78.0–100.0)
Platelets: 219 10*3/uL (ref 150–400)
RBC: 3.57 MIL/uL — AB (ref 3.87–5.11)
RDW: 13.8 % (ref 11.5–15.5)
WBC: 16.5 10*3/uL — ABNORMAL HIGH (ref 4.0–10.5)

## 2017-12-06 LAB — GLUCOSE, CAPILLARY
GLUCOSE-CAPILLARY: 73 mg/dL (ref 65–99)
GLUCOSE-CAPILLARY: 73 mg/dL (ref 65–99)
GLUCOSE-CAPILLARY: 80 mg/dL (ref 65–99)
GLUCOSE-CAPILLARY: 85 mg/dL (ref 65–99)
Glucose-Capillary: 77 mg/dL (ref 65–99)
Glucose-Capillary: 82 mg/dL (ref 65–99)
Glucose-Capillary: 81 mg/dL (ref 65–99)
Glucose-Capillary: 74 mg/dL (ref 65–99)

## 2017-12-06 LAB — CREATININE, SERUM
Creatinine, Ser: 1.67 mg/dL — ABNORMAL HIGH (ref 0.44–1.00)
GFR calc Af Amer: 46 mL/min — ABNORMAL LOW (ref >60)
GFR calc non Af Amer: 40 mL/min — ABNORMAL LOW (ref >60)

## 2017-12-06 LAB — RPR: RPR Ser Ql: NONREACTIVE

## 2017-12-06 SURGERY — Surgical Case
Anesthesia: Epidural

## 2017-12-06 MED ORDER — DIPHENHYDRAMINE HCL 25 MG PO CAPS
25.0000 mg | ORAL_CAPSULE | Freq: Four times a day (QID) | ORAL | Status: DC | PRN
  Administered 2017-12-06 – 2017-12-07 (×2): 25 mg via ORAL
  Filled 2017-12-06 (×3): qty 1

## 2017-12-06 MED ORDER — WITCH HAZEL-GLYCERIN EX PADS: 1.0000 "application " | MEDICATED_PAD | CUTANEOUS | Status: DC | PRN

## 2017-12-06 MED ORDER — LACTATED RINGERS IV SOLN
INTRAVENOUS | Status: DC | PRN
  Administered 2017-12-06: 17:00:00 via INTRAVENOUS

## 2017-12-06 MED ORDER — LACTATED RINGERS IV SOLN
INTRAVENOUS | Status: DC
  Administered 2017-12-07: 02:00:00 via INTRAVENOUS

## 2017-12-06 MED ORDER — ONDANSETRON HCL 4 MG/2ML IJ SOLN
INTRAMUSCULAR | Status: DC | PRN
  Administered 2017-12-06: 4 mg via INTRAVENOUS

## 2017-12-06 MED ORDER — NALBUPHINE HCL 10 MG/ML IJ SOLN
5.0000 mg | INTRAMUSCULAR | Status: DC | PRN
  Administered 2017-12-06: 5 mg via INTRAVENOUS
  Filled 2017-12-06: qty 1

## 2017-12-06 MED ORDER — TETANUS-DIPHTH-ACELL PERTUSSIS 5-2.5-18.5 LF-MCG/0.5 IM SUSP: 0.5000 mL | Freq: Once | INTRAMUSCULAR | Status: DC

## 2017-12-06 MED ORDER — MORPHINE SULFATE (PF) 0.5 MG/ML IJ SOLN
INTRAMUSCULAR | Status: AC
  Filled 2017-12-06: qty 10

## 2017-12-06 MED ORDER — FENTANYL CITRATE (PF) 100 MCG/2ML IJ SOLN
INTRAMUSCULAR | Status: AC
  Filled 2017-12-06: qty 2

## 2017-12-06 MED ORDER — DEXAMETHASONE SODIUM PHOSPHATE 4 MG/ML IJ SOLN
INTRAMUSCULAR | Status: DC | PRN
  Administered 2017-12-06: 4 mg via INTRAVENOUS

## 2017-12-06 MED ORDER — SIMETHICONE 80 MG PO CHEW
80.0000 mg | CHEWABLE_TABLET | Freq: Three times a day (TID) | ORAL | Status: DC
  Administered 2017-12-07 – 2017-12-09 (×6): 80 mg via ORAL
  Filled 2017-12-06 (×6): qty 1

## 2017-12-06 MED ORDER — CEFAZOLIN SODIUM-DEXTROSE 2-4 GM/100ML-% IV SOLN
2.0000 g | Freq: Once | INTRAVENOUS | Status: AC
  Administered 2017-12-06: 2 g via INTRAVENOUS

## 2017-12-06 MED ORDER — OXYTOCIN 40 UNITS IN LACTATED RINGERS INFUSION - SIMPLE MED
1.0000 m[IU]/min | INTRAVENOUS | Status: DC
  Administered 2017-12-06: 2 m[IU]/min via INTRAVENOUS
  Filled 2017-12-06: qty 1000

## 2017-12-06 MED ORDER — MENTHOL 3 MG MT LOZG: 1.0000 | LOZENGE | OROMUCOSAL | Status: DC | PRN

## 2017-12-06 MED ORDER — OXYTOCIN 10 UNIT/ML IJ SOLN
INTRAMUSCULAR | Status: AC
  Filled 2017-12-06: qty 4

## 2017-12-06 MED ORDER — SIMETHICONE 80 MG PO CHEW
80.0000 mg | CHEWABLE_TABLET | ORAL | Status: DC
  Administered 2017-12-06 – 2017-12-08 (×3): 80 mg via ORAL
  Filled 2017-12-06 (×3): qty 1

## 2017-12-06 MED ORDER — OXYCODONE HCL 5 MG PO TABS
5.0000 mg | ORAL_TABLET | ORAL | Status: DC | PRN
  Administered 2017-12-08 – 2017-12-09 (×6): 5 mg via ORAL
  Filled 2017-12-06 (×7): qty 1

## 2017-12-06 MED ORDER — COCONUT OIL OIL: 1.0000 "application " | TOPICAL_OIL | Status: DC | PRN

## 2017-12-06 MED ORDER — OXYCODONE HCL 5 MG PO TABS
10.0000 mg | ORAL_TABLET | ORAL | Status: DC | PRN
  Administered 2017-12-07 – 2017-12-08 (×2): 10 mg via ORAL
  Filled 2017-12-06 (×2): qty 2

## 2017-12-06 MED ORDER — DIBUCAINE 1 % RE OINT: 1.0000 "application " | TOPICAL_OINTMENT | RECTAL | Status: DC | PRN

## 2017-12-06 MED ORDER — OXYTOCIN 40 UNITS IN LACTATED RINGERS INFUSION - SIMPLE MED: 2.5000 [IU]/h | INTRAVENOUS | Status: AC

## 2017-12-06 MED ORDER — SIMETHICONE 80 MG PO CHEW
80.0000 mg | CHEWABLE_TABLET | ORAL | Status: DC | PRN
Start: 2017-12-06 — End: 2017-12-09
  Filled 2017-12-06: qty 1

## 2017-12-06 MED ORDER — OXYTOCIN 10 UNIT/ML IJ SOLN
INTRAVENOUS | Status: DC | PRN
  Administered 2017-12-06: 40 [IU] via INTRAVENOUS

## 2017-12-06 MED ORDER — PRENATAL MULTIVITAMIN CH
1.0000 | ORAL_TABLET | Freq: Every day | ORAL | Status: DC
  Administered 2017-12-07 – 2017-12-08 (×2): 1 via ORAL
  Filled 2017-12-06 (×2): qty 1

## 2017-12-06 MED ORDER — DEXAMETHASONE SODIUM PHOSPHATE 10 MG/ML IJ SOLN
INTRAMUSCULAR | Status: AC
  Filled 2017-12-06: qty 1

## 2017-12-06 MED ORDER — ENOXAPARIN SODIUM 60 MG/0.6ML ~~LOC~~ SOLN
50.0000 mg | SUBCUTANEOUS | Status: DC
  Administered 2017-12-07 – 2017-12-09 (×3): 50 mg via SUBCUTANEOUS
  Filled 2017-12-06 (×3): qty 0.6

## 2017-12-06 MED ORDER — MORPHINE SULFATE (PF) 0.5 MG/ML IJ SOLN
INTRAMUSCULAR | Status: DC | PRN
  Administered 2017-12-06: 1 mg via INTRAVENOUS
  Administered 2017-12-06: 4 mg via EPIDURAL

## 2017-12-06 MED ORDER — TERBUTALINE SULFATE 1 MG/ML IJ SOLN: 0.2500 mg | Freq: Once | INTRAMUSCULAR | Status: DC | PRN

## 2017-12-06 MED ORDER — SODIUM BICARBONATE 8.4 % IV SOLN
INTRAVENOUS | Status: DC | PRN
  Administered 2017-12-06: 10 mL via EPIDURAL
  Administered 2017-12-06 (×2): 5 mL via EPIDURAL

## 2017-12-06 MED ORDER — FENTANYL CITRATE (PF) 100 MCG/2ML IJ SOLN
INTRAMUSCULAR | Status: DC | PRN
  Administered 2017-12-06 (×2): 100 ug via INTRAVENOUS

## 2017-12-06 MED ORDER — ONDANSETRON HCL 4 MG/2ML IJ SOLN
INTRAMUSCULAR | Status: AC
  Filled 2017-12-06: qty 2

## 2017-12-06 MED ORDER — ZOLPIDEM TARTRATE 5 MG PO TABS: 5.0000 mg | ORAL_TABLET | Freq: Every evening | ORAL | Status: DC | PRN

## 2017-12-06 MED ORDER — LIDOCAINE-EPINEPHRINE (PF) 2 %-1:200000 IJ SOLN
INTRAMUSCULAR | Status: DC | PRN
  Administered 2017-12-06: 5 mL via EPIDURAL

## 2017-12-06 MED ORDER — SENNOSIDES-DOCUSATE SODIUM 8.6-50 MG PO TABS
2.0000 | ORAL_TABLET | ORAL | Status: DC
  Administered 2017-12-06 – 2017-12-08 (×3): 2 via ORAL
  Filled 2017-12-06 (×3): qty 2

## 2017-12-06 MED ORDER — HYDROXYZINE HCL 50 MG PO TABS
25.0000 mg | ORAL_TABLET | Freq: Three times a day (TID) | ORAL | Status: DC | PRN
  Administered 2017-12-06: 25 mg via ORAL
  Filled 2017-12-06: qty 1

## 2017-12-06 MED ORDER — ACETAMINOPHEN 325 MG PO TABS
650.0000 mg | ORAL_TABLET | ORAL | Status: DC | PRN
  Administered 2017-12-07 – 2017-12-09 (×9): 650 mg via ORAL
  Filled 2017-12-06 (×10): qty 2

## 2017-12-06 SURGICAL SUPPLY — 43 items
APL SKNCLS STERI-STRIP NONHPOA (GAUZE/BANDAGES/DRESSINGS) ×1
BENZOIN TINCTURE PRP APPL 2/3 (GAUZE/BANDAGES/DRESSINGS) ×2 IMPLANT
CELLS DAT CNTRL 66122 CELL SVR (MISCELLANEOUS) ×1 IMPLANT
CHLORAPREP W/TINT 26ML (MISCELLANEOUS) ×3 IMPLANT
CLAMP CORD UMBIL (MISCELLANEOUS) IMPLANT
CLOSURE WOUND 1/2 X4 (GAUZE/BANDAGES/DRESSINGS) ×1
CLOTH BEACON ORANGE TIMEOUT ST (SAFETY) ×3 IMPLANT
DRSG OPSITE POSTOP 4X10 (GAUZE/BANDAGES/DRESSINGS) ×3 IMPLANT
ELECT REM PT RETURN 9FT ADLT (ELECTROSURGICAL) ×3
ELECTRODE REM PT RTRN 9FT ADLT (ELECTROSURGICAL) ×1 IMPLANT
EXTRACTOR VACUUM KIWI (MISCELLANEOUS) IMPLANT
GLOVE BIO SURGEON ST LM GN SZ9 (GLOVE) ×3 IMPLANT
GLOVE BIOGEL PI IND STRL 7.0 (GLOVE) ×1 IMPLANT
GLOVE BIOGEL PI IND STRL 9 (GLOVE) ×1 IMPLANT
GLOVE BIOGEL PI INDICATOR 7.0 (GLOVE) ×2
GLOVE BIOGEL PI INDICATOR 9 (GLOVE) ×2
GOWN STRL REUS W/TWL 2XL LVL3 (GOWN DISPOSABLE) ×3 IMPLANT
GOWN STRL REUS W/TWL LRG LVL3 (GOWN DISPOSABLE) ×3 IMPLANT
NDL HYPO 25X5/8 SAFETYGLIDE (NEEDLE) IMPLANT
NEEDLE HYPO 25X5/8 SAFETYGLIDE (NEEDLE) IMPLANT
NS IRRIG 1000ML POUR BTL (IV SOLUTION) ×3 IMPLANT
PACK C SECTION WH (CUSTOM PROCEDURE TRAY) ×3 IMPLANT
PAD ABD 8X7 1/2 STERILE (GAUZE/BANDAGES/DRESSINGS) ×2 IMPLANT
PAD OB MATERNITY 4.3X12.25 (PERSONAL CARE ITEMS) ×3 IMPLANT
PENCIL SMOKE EVAC W/HOLSTER (ELECTROSURGICAL) ×3 IMPLANT
RETRACTOR WND ALEXIS 18 MED (MISCELLANEOUS) IMPLANT
RTRCTR C-SECT PINK 25CM LRG (MISCELLANEOUS) IMPLANT
RTRCTR C-SECT PINK 34CM XLRG (MISCELLANEOUS) IMPLANT
RTRCTR WOUND ALEXIS 18CM MED (MISCELLANEOUS) ×3
SPONGE GAUZE 4X4 FOR O.R. (GAUZE/BANDAGES/DRESSINGS) ×4 IMPLANT
STRIP CLOSURE SKIN 1/2X4 (GAUZE/BANDAGES/DRESSINGS) ×1 IMPLANT
SUT MNCRL 0 VIOLET CTX 36 (SUTURE) ×2 IMPLANT
SUT MONOCRYL 0 CTX 36 (SUTURE) ×4
SUT PLAIN 2 0 (SUTURE) ×3
SUT PLAIN ABS 2-0 CT1 27XMFL (SUTURE) IMPLANT
SUT VIC AB 0 CT1 27 (SUTURE) ×6
SUT VIC AB 0 CT1 27XBRD ANBCTR (SUTURE) ×1 IMPLANT
SUT VIC AB 2-0 CT1 27 (SUTURE) ×3
SUT VIC AB 2-0 CT1 TAPERPNT 27 (SUTURE) ×1 IMPLANT
SUT VIC AB 4-0 KS 27 (SUTURE) ×3 IMPLANT
SYR BULB IRRIGATION 50ML (SYRINGE) IMPLANT
TOWEL OR 17X24 6PK STRL BLUE (TOWEL DISPOSABLE) ×3 IMPLANT
TRAY FOLEY BAG SILVER LF 14FR (SET/KITS/TRAYS/PACK) ×3 IMPLANT

## 2017-12-06 NOTE — Transfer of Care (Signed)
Immediate Anesthesia Transfer of Care Note  Patient: Olivia [name]  Procedure(s) Performed: CESAREAN SECTION (N/A )  Patient Location: PACU  Anesthesia Type:Epidural  Level of Consciousness: awake, alert  and oriented  Airway & Oxygen Therapy: Patient Spontanous Breathing  Post-op Assessment: Report given to RN and Post -op Vital signs reviewed and stable  Post vital signs: Reviewed and stable  Last Vitals:  Vitals:   12/06/17 1600 12/06/17 1630  BP: 134/82 140/83  Pulse: 100 (!) 103  Resp: 18   Temp:    SpO2:      Last Pain:  Vitals:   12/06/17 1600  TempSrc:   PainSc: Asleep      Patients Stated Pain Goal: 5 (12/06/17 0730)  Complications: No apparent anesthesia complications

## 2017-12-06 NOTE — Op Note (Signed)
Olivia [name] PROCEDURE DATE: 12/06/2017  PREOPERATIVE DIAGNOSES: Intrauterine pregnancy at 6332w4d weeks gestation; cephalo-pelvic disproportion and failure to progress: arrest of dilation  POSTOPERATIVE DIAGNOSES: The same  PROCEDURE: Primary Low Transverse Cesarean Section  SURGEON:  Dr. Christin BachJohn Ferguson and Dr. Caryl AdaJazma Dallon Dacosta  ASSISTANT:  N/A  ANESTHESIOLOGY TEAM: Anesthesiologist: Phillips Groutarignan, Peter, MD; Bethena Midgetddono, Ernest, MD CRNA: Shanon PayorGregory, Suzanne M, CRNA  INDICATIONS: Olivia [name] is a 32 y.o. [phone] at 6932w4d here for cesarean section secondary to the indications listed under preoperative diagnoses; please see preoperative note for further details.  The risks of cesarean section were discussed with the patient including but were not limited to: bleeding which may require transfusion or reoperation; infection which may require antibiotics; injury to bowel, bladder, ureters or other surrounding organs; injury to the fetus; need for additional procedures including hysterectomy in the event of a life-threatening hemorrhage; placental abnormalities wth subsequent pregnancies, incisional problems, thromboembolic phenomenon and other postoperative/anesthesia complications.   The patient concurred with the proposed plan, giving informed written consent for the procedure.    FINDINGS:  Viable female infant in cephalic presentation.  Apgars 8 and 9.  Clear amniotic fluid.  Intact placenta, three vessel cord.  Normal uterus, fallopian tubes and ovaries bilaterally. Gross hematuria from bladder compression of fetal head. Significant molding of fetal scalp. Fetus was positioned OP.  ANESTHESIA: Epidural  INTRAVENOUS FLUIDS: 1400 ml   ESTIMATED BLOOD LOSS: 705 ml URINE OUTPUT:  250 ml SPECIMENS: Placenta sent to L&D COMPLICATIONS: None immediate  PROCEDURE IN DETAIL:  The patient preoperatively received intravenous antibiotics and had sequential compression devices applied to her lower extremities.  She  was then taken to the operating room where the epidural anesthesia was dosed up to surgical level and was found to be adequate. She was then placed in a dorsal supine position with a leftward tilt, and prepped and draped in a sterile manner.  A foley catheter was placed into her bladder and attached to constant gravity.  After an adequate timeout was performed, a Pfannenstiel skin incision was made with scalpel and carried through to the underlying layer of fascia. The fascia was incised in the midline, and this incision was extended bilaterally using the Mayo scissors.  Kocher clamps were applied to the superior aspect of the fascial incision and the underlying rectus muscles were dissected off bluntly.  A similar process was carried out on the inferior aspect of the fascial incision. The rectus muscles were separated in the midline bluntly and the peritoneum was entered bluntly. Attention was turned to the lower uterine segment where a low transverse hysterotomy was made with a scalpel and extended bilaterally bluntly.  The infant was successfully delivered, the cord was clamped and cut after one minute, and the infant was handed over to the awaiting neonatology team. Uterine massage was then administered, and the placenta delivered intact with a three-vessel cord. The uterus was then cleared of clots and debris.  The hysterotomy was closed with 0 Monocryl in a running locked fashion, and an imbricating layer was also placed with 0 Monocryl.  The pelvis was cleared of all clot and debris. Hemostasis was confirmed on all surfaces.  The peritoneum was closed with a 0 Vicryl running stitch and the pyramidalis muscles were reapproximated using 0 Vicryl interrupted stitches. The fascia was then closed using 0 Monocryl in a running fashion.  The subcutaneous layer then reapproximated with 2-0 plain gut interrupted stitches.  The skin was closed with a 4-0 Vicryl subcuticular  stitch. The patient tolerated the procedure  well. Sponge, lap, instrument and needle counts were correct x 3.  She was taken to the recovery room in stable condition.   Caryl Ada, DO OB Fellow [facility], [facility]

## 2017-12-06 NOTE — Progress Notes (Signed)
Olivia [name] is a 32 y.o. G3P0020 at 6445w4d  admitted for induction of labor due to Hypertension. Subjective: Pt has made no change in exam, and has significant hematuria, which has been present x several hours Exam shows absolutely no descent of the vertex with strong contraction, indicating bony dystocia. Cesarean section recommended, and accepted , after discussion of risks, benefits probable outcomes. Objective: BP 134/82   Pulse 100   Temp 98.8 F (37.1 C) (Oral)   Resp 18   LMP 03/11/2017 (Approximate)   SpO2 98%  I/O last 3 completed shifts: In: -  Out: 1400 [Urine:1400] Total I/O In: -  Out: 775 [Urine:775]  FHT:  FHR: 145 bpm, variability: moderate,  accelerations:  Present,  decelerations:  Absent UC:   regular, every 3 minutes SVE:   Dilation: 9 Effacement (%): 100 Station: 0 Exam by:: Dr. Emelda FearFerguson  Labs: Lab Results  Component Value Date   WBC 14.5 (H) 12/05/2017   HGB 12.0 12/05/2017   HCT 35.8 (L) 12/05/2017   MCV 89.7 12/05/2017   PLT 237 12/05/2017    Assessment / Plan: Arrest in active phase of labor  Labor: cesarean section recommended. Preeclampsia:   Fetal Wellbeing:  Category I Pain Control:  Epidural I/D:  n/a Anticipated MOD:  to OR for cesarean section , non emergency cesarean  Tilda BurrowJohn V Vastie Douty 12/06/2017, 4:21 PM

## 2017-12-06 NOTE — Progress Notes (Addendum)
LABOR PROGRESS NOTE  Olivia [name] is a 32 y.o. G3P0020 at 3864w4d  admitted for PROM (unsure when ROM, likely around 0600 on 12/05/17  Subjective: Pt doing well. Pain controlled with epidural, but still feeling some pressure  Objective: BP (!) 153/89   Pulse (!) 115   Temp 98.1 F (36.7 C)   Resp 18   LMP 03/11/2017 (Approximate)   SpO2 98%  or  Vitals:   12/06/17 0300 12/06/17 0330 12/06/17 0334 12/06/17 0402  BP: (!) 153/89 (!) 164/91 (!) 154/80 (!) 158/88  Pulse: (!) 115 (!) 121 (!) 120 (!) 119  Resp: 18  16 18   Temp:      TempSrc:      SpO2:        SVE Dilation: 4.5 Effacement (%): 100 Station: 0 Presentation: Vertex Exam by:: Savannah brendle RN FHT: baseline rate 135, moderate varibility, +acel, no decel Toco: ctx q 1-2 min  Assessment / Plan: 32 y.o. G3P0020 at 2064w4d here for PPROM  Labor: IV Pitocin started for Augmentation, currently at 6 mU/min, contracting regularly but no cervical change. IUPC placed Fetal Wellbeing:  Cat I Pain Control:  epidural Anticipated MOD:  SVD  CHTN: Hx of CHTN, Bps 140s during preganncy. Bps here elevated; had one severe range (SBP 164), but mild range on recheck 5 minutes later. Asymptomatic, PIH labs wnl. Continue to monitor  Frederik PearJulie P Degele, MD 12/06/2017, 3:18 AM

## 2017-12-06 NOTE — Anesthesia Pain Management Evaluation Note (Signed)
  CRNA Pain Management Visit Note  Patient: Olivia [name], 32 y.o., female  "Hello I am a [facility]. We have an anesthesia team available at all times to provide care throughout the hospital, including epidural management and anesthesia for C-section. I don't know your plan for the delivery whether it a natural birth, water birth, IV sedation, nitrous supplementation, doula or epidural, but we want to meet your pain goals."   1.Was your pain managed to your expectations on prior hospitalizations?   Yes   2.What is your expectation for pain management during this hospitalization?     Epidural  3.How can we help you reach that goal? Epidural in place  Record the patient's initial score and the patient's pain goal.   Pain: 1  Pain Goal: 3 [facility] wants you to be able to say your pain was always managed very well.  Olivia [name] 12/06/2017

## 2017-12-06 NOTE — Progress Notes (Signed)
Labor Progress Note Autumn Pattyanesha M Bitner is a 32 y.o. G3P0020 at 3755w4d presented for PROM.  S: Patient uncomfortable, c/o back pain, trying positional changes. Itching relieved with Nubain.  O:  BP 139/71   Pulse (!) 107   Temp 98.6 F (37 C) (Oral)   Resp 18   LMP 03/11/2017 (Approximate)   SpO2 98%   MVH:QIONGEXBFHT:baseline rate 130, moderate variability, no acels, no decels Toco: ctx every 2-4 min, irregular  CVE: Dilation: 8 Effacement (%): 100(puffy ) Cervical Position: Anterior(left side anterior, right side posterior) Station: Plus 1, Plus 2(caput) Presentation: Vertex Exam by:: Dr. Doroteo GlassmanPhelps  A&P: 32 y.o. [id number] 6955w4d here for PROM. #Labor: Progressing well. Continue Pitocin #Pain: Epidural #FWB: Cat I #GBS positive, PCN #GDM: CBGs well controlled #GHTN: BP wnl  Ellwood DenseAlison Rumball, DO 1:14 PM

## 2017-12-06 NOTE — Anesthesia Postprocedure Evaluation (Signed)
Anesthesia Post Note  Patient: Olivia [name]  Procedure(s) Performed: AN AD HOC LABOR EPIDURAL     Patient location during evaluation: Mother Baby Anesthesia Type: Epidural Level of consciousness: awake and alert Pain management: pain level controlled Vital Signs Assessment: post-procedure vital signs reviewed and stable Respiratory status: spontaneous breathing, nonlabored ventilation and respiratory function stable Cardiovascular status: stable Postop Assessment: no headache, no backache and epidural receding Anesthetic complications: no    Last Vitals:  Vitals:   12/06/17 0730 12/06/17 0800  BP: (!) 155/92 130/81  Pulse: (!) 127 (!) 115  Resp: 18 18  Temp:    SpO2:      Last Pain:  Vitals:   12/06/17 0800  TempSrc:   PainSc: Asleep   Pain Goal: Patients Stated Pain Goal: 5 (12/06/17 0730)               Tychelle Purkey

## 2017-12-06 NOTE — Progress Notes (Signed)
Olivia [name] is a 32 y.o. G3P0020 at 5433w4d  admitted for IOL for GHTN, new diagnosis. Pregnancy also notable for gDM A1 with good control, EFW 2/15 was 5lb1 oz She has made slow progress, and is suspected to be OP.   Subjective: Mother somewhat anxious, but cooperative.   Objective: BP 137/81   Pulse (!) 101   Temp 98.8 F (37.1 C) (Oral)   Resp 18   LMP 03/11/2017 (Approximate)   SpO2 98%  I/O last 3 completed shifts: In: -  Out: 1400 [Urine:1400] Total I/O In: -  Out: 775 [Urine:775]  FHT:  FHR: 145 bpm, variability: moderate,  accelerations:  Present,  decelerations:  Absent UC:   regular, every 3 minutes SVE:   Dilation: 9 Effacement (%): 100 Station: 0(per ultrasound) Exam by:: Dr. Doroteo GlassmanPhelps Exam by JVFerguson 9/100/0 station with significant moulding, baby seems to be slipping the vertex thru the cervix Labs: Lab Results  Component Value Date   WBC 14.5 (H) 12/05/2017   HGB 12.0 12/05/2017   HCT 35.8 (L) 12/05/2017   MCV 89.7 12/05/2017   PLT 237 12/05/2017    Assessment / Plan: Induction of labor due to ghtn, with an AGA baby in OP position.,  progressing well on pitocin  Labor: Progressing normally Preeclampsia:  labs stable Fetal Wellbeing:  Category I Pain Control:  Epidural I/D:  n/a Anticipated MOD:  possible vaginal but is OP and unable to rotate. Will follow with hourly eval at this point. Tilda BurrowJohn V Jaklyn Alen 12/06/2017, 3:28 PM

## 2017-12-06 NOTE — Progress Notes (Signed)
LABOR PROGRESS NOTE  Autumn Pattyanesha M Heemstra is a 32 y.o. G3P0020 at 4122w4d  admitted for PROM (unsure when ROM, likely around 0600 on 12/05/17  Subjective: Pt doing well. Complaining of itching from epidural. Tearful because she is scared about delivery.   Objective: BP (!) 149/93   Pulse (!) 111   Temp 98.4 F (36.9 C) (Oral)   Resp 20   LMP 03/11/2017 (Approximate)   SpO2 98%  or  Vitals:   12/06/17 0730 12/06/17 0800 12/06/17 0830 12/06/17 0900  BP: (!) 155/92 130/81 (!) 140/91 (!) 149/93  Pulse: (!) 127 (!) 115 (!) 115 (!) 111  Resp: 18 18 18 20   Temp:   98.4 F (36.9 C)   TempSrc:   Oral   SpO2:        SVE Dilation: 7.5 Effacement (%): 100 Cervical Position: Anterior Station: Plus 1(moulding) Presentation: Vertex Exam by:: Enis SlipperJane Bailey, RN FHT: baseline rate 130, moderate varibility, +acel, no decel Toco: ctx q 1-4 min  Assessment / Plan: 32 y.o. G3P0020 at 2722w4d here for PPROM. Active labor  Labor: Progressing normally. Continue IV Pitocin Fetal Wellbeing:  Cat I Pain Control:  epidural Anticipated MOD:  SVD GDM: CBGs wnl GHTN: New dx intrapartum. BPs here elevated but not in severe range. Asymptomatic, PIH labs wnl. Continue to monitor  Caryl AdaJazma Phelps, DO 12/06/2017, 9:25 AM

## 2017-12-07 ENCOUNTER — Other Ambulatory Visit: Payer: Self-pay

## 2017-12-07 DIAGNOSIS — N179 Acute kidney failure, unspecified: Secondary | ICD-10-CM

## 2017-12-07 LAB — CBC
HCT: 30.6 % — ABNORMAL LOW (ref 36.0–46.0)
HEMOGLOBIN: 10.3 g/dL — AB (ref 12.0–15.0)
MCH: 30.2 pg (ref 26.0–34.0)
MCHC: 33.7 g/dL (ref 30.0–36.0)
MCV: 89.7 fL (ref 78.0–100.0)
PLATELETS: 223 10*3/uL (ref 150–400)
RBC: 3.41 MIL/uL — ABNORMAL LOW (ref 3.87–5.11)
RDW: 13.9 % (ref 11.5–15.5)
WBC: 15.8 10*3/uL — ABNORMAL HIGH (ref 4.0–10.5)

## 2017-12-07 LAB — CREATININE, SERUM
CREATININE: 1.21 mg/dL — AB (ref 0.44–1.00)
GFR, EST NON AFRICAN AMERICAN: 59 mL/min — AB (ref >60)

## 2017-12-07 LAB — BUN: BUN: 15 mg/dL (ref 6–20)

## 2017-12-07 LAB — GLUCOSE, CAPILLARY
GLUCOSE-CAPILLARY: 101 mg/dL — AB (ref 65–99)
Glucose-Capillary: 78 mg/dL (ref 65–99)

## 2017-12-07 MED ORDER — RHO D IMMUNE GLOBULIN 1500 UNIT/2ML IJ SOSY
300.0000 ug | PREFILLED_SYRINGE | Freq: Once | INTRAMUSCULAR | Status: AC
  Administered 2017-12-07: 300 ug via INTRAMUSCULAR
  Filled 2017-12-07: qty 2

## 2017-12-07 NOTE — Anesthesia Postprocedure Evaluation (Signed)
Anesthesia Post Note  Patient: Olivia [name]  Procedure(s) Performed: CESAREAN SECTION (N/A )     Patient location during evaluation: Mother Baby Anesthesia Type: Epidural Level of consciousness: awake Pain management: pain level controlled Vital Signs Assessment: post-procedure vital signs reviewed and stable Respiratory status: spontaneous breathing Cardiovascular status: stable Postop Assessment: no headache, no backache, epidural receding, patient able to bend at knees, no apparent nausea or vomiting and adequate PO intake Anesthetic complications: no    Last Vitals:  Vitals:   12/07/17 0200 12/07/17 1036  BP: (!) 124/47   Pulse: (!) 108 89  Resp: 18 18  Temp: 37.2 C 37 C  SpO2: 100%     Last Pain:  Vitals:   12/07/17 1036  TempSrc: Oral  PainSc: 0-No pain   Pain Goal: Patients Stated Pain Goal: 5 (12/06/17 0730)               Fanny DanceMULLINS,Nickalas Mccarrick

## 2017-12-07 NOTE — Progress Notes (Signed)
Psychosocial assessment completed.  [facility]  There are barriers to baby's discharge.  CSW to follow up with CPS worker to discuss disposition prior to baby's clearance for discharge.  [name] may discharge when medically ready and baby can become baby patient.  CSW discussed this with [name].

## 2017-12-07 NOTE — Anesthesia Postprocedure Evaluation (Signed)
Anesthesia Post Note  Patient: Olivia [name]  Procedure(s) Performed: CESAREAN SECTION (N/A )     Patient location during evaluation: Mother Baby Anesthesia Type: Epidural Level of consciousness: awake Pain management: pain level controlled Vital Signs Assessment: post-procedure vital signs reviewed and stable Respiratory status: spontaneous breathing Cardiovascular status: stable Postop Assessment: patient able to bend at knees and epidural receding Anesthetic complications: no    Last Vitals:  Vitals:   12/06/17 2200 12/07/17 0200  BP: 121/68 (!) 124/47  Pulse: (!) 116 (!) 108  Resp: 18 18  Temp: 37.3 C 37.2 C  SpO2: 95% 100%    Last Pain:  Vitals:   12/07/17 0200  TempSrc: Oral  PainSc: 3    Pain Goal: Patients Stated Pain Goal: 5 (12/06/17 0730)               Edison PaceWILKERSON,Markeisha Mancias

## 2017-12-07 NOTE — Progress Notes (Signed)
POSTPARTUM PROGRESS NOTE  Post Partum Day 1  Subjective:  Olivia [name] is a 32 y.o. [id number] s/p pLTCS at 2333w4d.  No acute events overnight.  Pt denies problems with ambulating, voiding or po intake.  She denies nausea or vomiting.  Pain is well controlled.  Foley just removed this morning, has not voided yet.  Objective: Blood pressure (!) 124/47, pulse (!) 108, temperature 98.9 F (37.2 C), temperature source Oral, resp. rate 18, last menstrual period 03/11/2017, SpO2 100 %, unknown if currently breastfeeding.  Physical Exam:  General: alert, cooperative and no distress Chest: no respiratory distress Heart:regular rate, distal pulses intact Abdomen: soft, nontender,  Uterine Fundus: firm, appropriately tender DVT Evaluation: No calf swelling or tenderness Extremities: trace edema Skin: warm, dry; incision with honeycomb dressing in place with small amount of blood trough dressing.   Recent Labs    12/06/17 1832 12/07/17 0554  HGB 10.9* 10.3*  HCT 32.1* 30.6*    Assessment/Plan: Olivia Pattyanesha M Lust is a 32 y.o. [id number] s/p pLTCS at 8833w4d for FTP.   POD#1 s/p pLTCS - Doing well. Hgb 10.3 from 12.0, asymptomatic --OOB, encouraged ambulation  AKI: Scr 0.95 on admission, up to 1.67 yesterday, 1.21 this morning. Ddx includes postobstructive AKI (pt had 1,400 ml urinary retention on admission) vs pre-renal, less likely PreE as she has normal Bps and other labs wnl. Adequate UOP with foley. Foely out this morning --Voiding trial --Check BMP tomorrow morning  DVT ppx:  --Lovenox, SCDs  Dispo: Plan for discharge POD#2 or #3.   LOS: 2 days   Kandra NicolasJulie P DegeleMD 12/07/2017, 9:05 AM

## 2017-12-07 NOTE — Addendum Note (Signed)
Addendum  created 12/07/17 0743 by Earmon PhoenixWilkerson, Madlyn Crosby P, CRNA   Sign clinical note

## 2017-12-07 NOTE — Addendum Note (Signed)
Addendum  created 12/07/17 1340 by Renford DillsMullins, Alyla Pietila L, CRNA   Sign clinical note

## 2017-12-08 ENCOUNTER — Encounter (HOSPITAL_COMMUNITY): Payer: Self-pay | Admitting: Obstetrics and Gynecology

## 2017-12-08 LAB — BASIC METABOLIC PANEL
ANION GAP: 8 (ref 5–15)
BUN: 14 mg/dL (ref 6–20)
CALCIUM: 8.8 mg/dL — AB (ref 8.9–10.3)
CO2: 23 mmol/L (ref 22–32)
CREATININE: 1 mg/dL (ref 0.44–1.00)
Chloride: 106 mmol/L (ref 101–111)
GFR calc non Af Amer: >60 mL/min (ref >60)
GLUCOSE: 73 mg/dL (ref 65–99)
Potassium: 3.9 mmol/L (ref 3.5–5.1)
Sodium: 137 mmol/L (ref 135–145)

## 2017-12-08 LAB — RH IG WORKUP (INCLUDES ABO/RH)
ABO/RH(D): O NEG
Fetal Screen: NEGATIVE
GESTATIONAL AGE(WKS): 38.4
Unit division: 0

## 2017-12-08 NOTE — Progress Notes (Signed)
Subjective: Postpartum Day #2: Cesarean Delivery Patient reports tolerating PO, + flatus and no problems voiding. Bottlefeeding; undecided re contraception.  ** SW note in chart mentioned barriers to infant's d/c**  Objective: Vital signs in last 24 hours: Temp:  [98 F (36.7 C)-99 F (37.2 C)] 98 F (36.7 C) (03/02 0653) Pulse Rate:  [85-100] 85 (03/02 0653) Resp:  [18-20] 20 (03/02 0653) BP: (129-130)/(85-86) 130/86 (03/02 16100653) Has voided >1000cc yesterday.  Physical Exam:  General: alert, cooperative and no distress Lochia: appropriate Uterine Fundus: firm Incision: honeycomb, sm areas stained and unchanged DVT Evaluation: No evidence of DVT seen on physical exam.  Recent Labs    12/06/17 1832 12/07/17 0554  HGB 10.9* 10.3*  HCT 32.1* 30.6*   S creatine: 1.0 (decreasing)  Assessment/Plan: Status post Cesarean section. Doing well postoperatively.  Continue current care. Creatine decreasing- will stop monitoring as pt is voiding copious amounts now. Will keep for today as uncertain plan for baby; plan for d/c on POD#3.  Cam HaiSHAW, KIMBERLY CNM 12/08/2017, 8:08 AM

## 2017-12-09 MED ORDER — OXYCODONE HCL 5 MG PO TABS: 5.0000 mg | ORAL_TABLET | Freq: Four times a day (QID) | ORAL | 0 refills | Status: DC | PRN

## 2017-12-09 NOTE — Discharge Summary (Signed)
OB Discharge Summary     Patient Name: Olivia [name] DOB: Sep 24, 1986 MRN: [id number]  Date of admission: 12/05/2017 Delivering MD: Jonnie Kind   Date of discharge: 12/09/2017  Admitting diagnosis: 79WKS CTX Intrauterine pregnancy: [redacted]w[redacted]d    Secondary diagnosis:  Active Problems:   Obesity affecting pregnancy in second trimester   Late prenatal care affecting pregnancy   Tobacco use affecting pregnancy, antepartum   Rh negative state in antepartum period   Cocaine use complicating pregnancy, second trimester   Diabetes in pregnancy   Chronic hypertension during pregnancy, antepartum   PROM (premature rupture of membranes)   Status post primary low transverse cesarean section   AKI (acute kidney injury) ([facility]      Discharge diagnosis: Term Pregnancy Delivered, CHTN and GDM A1                                                                                        Post partum procedures:rhogam  Augmentation: Pitocin  Complications: None  Hospital course:  Onset of Labor With Unplanned C/S  32y.o. yo [id number] 348w4das admitted in Latent Labor and PROM on 12/05/2017. Patient had a labor course significant for augmentation with Pitocin, but has failure to progress and suspected CPD. Membrane Rupture Time/Date: 6:00 AM ,12/05/2017   The patient went for cesarean section due to Arrest of Dilation, and delivered a Viable infant,12/06/2017  Details of operation can be found in separate operative note. Patient had an uncomplicated postpartum course. She had elevated BP intrapartum, but normal BP postpartum. Of note, patient also had AKI, likely 2/2 to urinary rentention on admission (had 1400 ml rentention). SCr was up to 1.67, but improved postapartum, and patient able to urinate normally after foley removal s/p C/S. She is ambulating,tolerating a regular diet, passing flatus, and urinating well.  Patient is discharged home in stable condition 12/09/17.  Physical exam  Vitals:    12/07/17 1910 12/08/17 0653 12/08/17 1857 12/09/17 0618  BP: 129/85 130/86 139/81 128/61  Pulse: 100 85 91 99  Resp: _0 Temp: 98.1 F (36.7 C) 98 F (36.7 C) 98.2 F (36.8 C) 98.1 F (36.7 C)  TempSrc: Oral Oral Oral Oral  SpO2:      Height:  5' 1" (1.549 m)     General: alert, cooperative and no distress Lochia: appropriate Uterine Fundus: firm Incision: Dressing is clean, dry, and intact with Honeycomb dressing in place DVT Evaluation: No evidence of DVT seen on physical exam. No significant calf/ankle edema. Labs: Lab Results  Component Value Date   WBC 15.8 (H) 12/07/2017   HGB 10.3 (L) 12/07/2017   HCT 30.6 (L) 12/07/2017   MCV 89.7 12/07/2017   PLT 223 12/07/2017   CMP Latest Ref Rng & Units 12/08/2017  Glucose 65 - 99 mg/dL 73  BUN 6 - 20 mg/dL 14  Creatinine 0.44 - 1.00 mg/dL 1.00  Sodium 135 - 145 mmol/L 137  Potassium 3.5 - 5.1 mmol/L 3.9  Chloride 101 - 111 mmol/L 106  CO2 22 - 32 mmol/L 23  Calcium 8.9 - 10.3 mg/dL 8.8(L)  Total Protein 6.5 -  8.1 g/dL -  Total Bilirubin 0.3 - 1.2 mg/dL -  Alkaline Phos 38 - 126 U/L -  AST 15 - 41 U/L -  ALT 14 - 54 U/L -    Discharge instruction: per After Visit Summary and "Baby and Me Booklet".  After visit meds:  Allergies as of 12/09/2017      Reactions   Ibuprofen Anaphylaxis, Swelling      Medication List    STOP taking these medications   ACCU-CHEK FASTCLIX LANCETS Misc   ACCU-CHEK GUIDE w/Device Kit   aspirin 81 MG chewable tablet     TAKE these medications   acetaminophen 500 MG tablet Commonly known as:  TYLENOL Take 500 mg by mouth every 6 (six) hours as needed for mild pain or headache.   calcium carbonate 500 MG chewable tablet Commonly known as:  TUMS - dosed in mg elemental calcium Chew 1 tablet by mouth 2 (two) times daily as needed for indigestion or heartburn.   Doxylamine-Pyridoxine 10-10 MG Tbec Commonly known as:  DICLEGIS Take 1 tablet with breakfast and lunch.  Take 2  tablets at bedtime.   glucose blood test strip Commonly known as:  ACCU-CHEK GUIDE Use as instructed   loratadine 10 MG tablet Commonly known as:  CLARITIN Take 1 tablet (10 mg total) daily by mouth. What changed:    when to take this  reasons to take this   omeprazole 20 MG capsule Commonly known as:  PRILOSEC Take 1 capsule (20 mg total) 2 (two) times daily before a meal by mouth.   ondansetron 8 MG tablet Commonly known as:  ZOFRAN Take 1 tablet (8 mg total) by mouth every 8 (eight) hours as needed for nausea or vomiting.   oxyCODONE 5 MG immediate release tablet Commonly known as:  Oxy IR/ROXICODONE Take 1 tablet (5 mg total) by mouth every 6 (six) hours as needed for severe pain.   PRENATE PIXIE 10-0.6-0.4-200 MG Caps Take 1 tablet by mouth daily.   Vitamin D (Ergocalciferol) 50000 units Caps capsule Commonly known as:  DRISDOL Take 1 capsule (50,000 Units total) by mouth every 7 (seven) days. What changed:  additional instructions       Diet: routine diet  Activity: Advance as tolerated. Pelvic rest for 6 weeks.   Outpatient follow up:  - 2 weeks for incision check - 4 week PP check. Will need 2-hr GTT Follow up Appt: Future Appointments  Date Time Provider Department Center  12/21/2017 10:30 AM Harper, Charles A, MD [facility] None  12/25/2017  3:45 PM Arfeen, Syed T, MD [facility] None  01/03/2018  1:30 PM Schlosberg, Jessica R [facility] None  01/03/2018  2:30 PM Harper, Charles A, MD [facility] None   Follow up Visit:No Follow-up on file.  Postpartum contraception: Undecided; likely Depo vs Nexplanon  Newborn Data: Live born female  Birth Weight: 6 lb 2.1 oz (2780 g) APGAR: 8, 9  Newborn Delivery   Birth date/time:  12/06/2017 17:06:00 Delivery type:  C-Section, Low Transverse C-section categorization:  Primary     Baby Feeding: Bottle Disposition: pending CPS clearing for discharge   12/09/2017  P , MD   

## 2017-12-09 NOTE — Progress Notes (Signed)
CSW spoke with CPS worker A. Rodriguez via telephone.  CPS reported that there are no barriers to infant's discharge.CPS will continue to provided services to the family after they are discharged. CSW updated medical team.   Jessilynn Taft Boyd-Gilyard, MSW, LCSW Clinical Social Work [phone] 

## 2017-12-09 NOTE — Discharge Instructions (Signed)
Cesarean Delivery, Care After Refer to this sheet in the next few weeks. These instructions provide you with information about caring for yourself after your procedure. Your health care provider may also give you more specific instructions. Your treatment has been planned according to current medical practices, but problems sometimes occur. Call your health care provider if you have any problems or questions after your procedure. What can I expect after the procedure? After the procedure, it is common to have:  A small amount of blood or clear fluid coming from the incision.  Some redness, swelling, and pain in your incision area.  Some abdominal pain and soreness.  Vaginal bleeding (lochia).  Pelvic cramps.  Fatigue.  Follow these instructions at home: Incision care   Follow instructions from your health care provider about how to take care of your incision. Make sure you: ? Wash your hands with soap and water before you change your bandage (dressing). If soap and water are not available, use hand sanitizer. ? If you have a dressing, change it as told by your health care provider. ? Leave stitches (sutures), skin staples, skin glue, or adhesive strips in place. These skin closures may need to stay in place for 2 weeks or longer. If adhesive strip edges start to loosen and curl up, you may trim the loose edges. Do not remove adhesive strips completely unless your health care provider tells you to do that.  Check your incision area every day for signs of infection. Check for: ? More redness, swelling, or pain. ? More fluid or blood. ? Warmth. ? Pus or a bad smell.  When you cough or sneeze, hug a pillow. This helps with pain and decreases the chance of your incision opening up (dehiscing). Do this until your incision heals. Medicines  Take over-the-counter and prescription medicines only as told by your health care provider.  If you were prescribed an antibiotic medicine, take it  as told by your health care provider. Do not stop taking the antibiotic until it is finished. Driving  Do not drive or operate heavy machinery while taking prescription pain medicine. Lifestyle  Do not drink alcohol. This is especially important if you are breastfeeding or taking pain medicine.  Do not use tobacco products, including cigarettes, chewing tobacco, or e-cigarettes. If you need help quitting, ask your health care provider. Tobacco can delay wound healing. Eating and drinking  Drink at least 8 eight-ounce glasses of water every day unless told not to by your health care provider. If you breastfeed, you may need to drink more water than this.  Eat high-fiber foods every day. These foods may help prevent or relieve constipation. High-fiber foods include: ? Whole grain cereals and breads. ? Brown rice. ? Beans. ? Fresh fruits and vegetables. Activity  Return to your normal activities as told by your health care provider. Ask your health care provider what activities are safe for you.  Rest as much as possible. Try to rest or take a nap while your baby is sleeping.  Do not lift anything that is heavier than your baby or 10 lb (4.5 kg) as told by your health care provider.  Ask your health care provider when you can engage in sexual activity. This may depend on your: ? Risk of infection. ? Healing rate. ? Comfort and desire to engage in sexual activity. Bathing  Do not take baths, swim, or use a hot tub until your health care provider approves. Ask your health care provider if  you can take showers. You may only be allowed to take sponge baths until your incision heals. General instructions  Do not use tampons or douches until your health care provider approves.  Wear: ? Loose, comfortable clothing. ? A supportive and well-fitting bra.  Watch for any blood clots that may pass from your vagina. These may look like clumps of dark red, brown, or black discharge.  Keep  your perineum clean and dry as told by your health care provider.  Wipe from front to back when you use the toilet.  If possible, have someone help you care for your baby and help with household activities for a few days after you leave the hospital.  Keep all follow-up visits for you and your baby as told by your health care provider. This is important. Contact a health care provider if:  You have: ? Bad-smelling vaginal discharge. ? Difficulty urinating. ? Pain when urinating. ? A sudden increase or decrease in the frequency of your bowel movements. ? More redness, swelling, or pain around your incision. ? More fluid or blood coming from your incision. ? Pus or a bad smell coming from your incision. ? A fever. ? A rash. ? Little or no interest in activities you used to enjoy. ? Questions about caring for yourself or your baby. ? Nausea.  Your incision feels warm to the touch.  Your breasts turn red or become painful or hard.  You feel unusually sad or worried.  You vomit.  You pass large blood clots from your vagina. If you pass a blood clot, save it to show to your health care provider. Do not flush blood clots down the toilet without showing your health care provider.  You urinate more than usual.  You are dizzy or light-headed.  You have not breastfed and have not had a menstrual period for 12 weeks after delivery.  You stopped breastfeeding and have not had a menstrual period for 12 weeks after stopping breastfeeding. Get help right away if:  You have: ? Pain that does not go away or get better with medicine. ? Chest pain. ? Difficulty breathing. ? Blurred vision or spots in your vision. ? Thoughts about hurting yourself or your baby. ? New pain in your abdomen or in one of your legs. ? A severe headache.  You faint.  You bleed from your vagina so much that you fill two sanitary pads in one hour. This information is not intended to replace advice given to  you by your health care provider. Make sure you discuss any questions you have with your health care provider. Document Released: 06/17/2002 Document Revised: 10/28/2016 Document Reviewed: 08/30/2015 Elsevier Interactive Patient Education  2018 [employer]   Contraception Choices Contraception, also called birth control, means things to use or ways to try not to get pregnant. Hormonal birth control This kind of birth control uses hormones. Here are some types of hormonal birth control:  A tube that is put under skin of the arm (implant). The tube can stay in for as long as 3 years.  Shots to get every 3 months (injections).  Pills to take every day (birth control pills).  A patch to change 1 time each week for 3 weeks (birth control patch). After that, the patch is taken off for 1 week.  A ring to put in the vagina. The ring is left in for 3 weeks. Then it is taken out of the vagina for 1 week. Then a new ring  is put in. °· Pills to take after unprotected sex (emergency birth control pills). ° °Barrier birth control °Here are some types of barrier birth control: °· A thin covering that is put on the penis before sex (female condom). The covering is thrown away after sex. °· A soft, loose covering that is put in the vagina before sex (female condom). The covering is thrown away after sex. °· A rubber bowl that sits over the cervix (diaphragm). The bowl must be made for you. The bowl is put into the vagina before sex. The bowl is left in for 6-8 hours after sex. It is taken out within 24 hours. °· A small, soft cup that fits over the cervix (cervical cap). The cup must be made for you. The cup can be left in for 6-8 hours after sex. It is taken out within 48 hours. °· A sponge that is put into the vagina before sex. It must be left in for at least 6 hours after sex. It must be taken out within 30 hours. Then it is thrown away. °· A chemical that kills or stops sperm from getting into the uterus  (spermicide). It may be a pill, cream, jelly, or foam to put in the vagina. The chemical should be used at least 10-15 minutes before sex. ° °IUD (intrauterine) birth control °An IUD is a small, T-shaped piece of plastic. It is put inside the uterus. There are two kinds: °· Hormone IUD. This kind can stay in for 3-5 years. °· Copper IUD. This kind can stay in for 10 years. ° °Permanent birth control °Here are some types of permanent birth control: °· Surgery to block the fallopian tubes. °· Having an insert put into each fallopian tube. °· Surgery to tie off the tubes that carry sperm (vasectomy). ° °Natural planning birth control °Here are some types of natural planning birth control: °· Not having sex on the days the woman could get pregnant. °· Using a calendar: °? To keep track of the length of each period. °? To find out what days pregnancy can happen. °? To plan to not have sex on days when pregnancy can happen. °· Watching for symptoms of ovulation and not having sex during ovulation. One way the woman can check for ovulation is to check her temperature. °· Waiting to have sex until after ovulation. ° °Summary °· Contraception, also called birth control, means things to use or ways to try not to get pregnant. °· Hormonal methods of birth control include implants, injections, pills, patches, vaginal rings, and emergency birth control pills. °· Barrier methods of birth control can include female condoms, female condoms, diaphragms, cervical caps, sponges, and spermicides. °· There are two types of IUD (intrauterine device) birth control. An IUD can be put in a woman's uterus to prevent pregnancy for 3-5 years. °· Permanent sterilization can be done through a procedure for males, females, or both. °· Natural planning methods involve not having sex on the days when the woman could get pregnant. °This information is not intended to replace advice given to you by your health care provider. Make sure you discuss any  questions you have with your health care provider. °Document Released: 07/23/2009 Document Revised: 10/05/2016 Document Reviewed: 10/05/2016 °Elsevier Interactive Patient Education © 2017 Elsevier Inc. ° °
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
## 2017-12-21 ENCOUNTER — Ambulatory Visit: Payer: Self-pay | Admitting: Obstetrics

## 2017-12-21 ENCOUNTER — Ambulatory Visit (INDEPENDENT_AMBULATORY_CARE_PROVIDER_SITE_OTHER): Payer: Medicaid Other | Admitting: Obstetrics

## 2017-12-21 ENCOUNTER — Encounter: Payer: Self-pay | Admitting: Obstetrics

## 2017-12-21 VITALS — BP 148/104 | HR 80 | Wt 206.0 lb

## 2017-12-21 DIAGNOSIS — R7989 Other specified abnormal findings of blood chemistry: Secondary | ICD-10-CM

## 2017-12-21 DIAGNOSIS — R109 Unspecified abdominal pain: Secondary | ICD-10-CM

## 2017-12-21 DIAGNOSIS — G8918 Other acute postprocedural pain: Secondary | ICD-10-CM

## 2017-12-21 DIAGNOSIS — O165 Unspecified maternal hypertension, complicating the puerperium: Secondary | ICD-10-CM

## 2017-12-21 DIAGNOSIS — E569 Vitamin deficiency, unspecified: Secondary | ICD-10-CM

## 2017-12-21 DIAGNOSIS — Z3009 Encounter for other general counseling and advice on contraception: Secondary | ICD-10-CM

## 2017-12-21 MED ORDER — OXYCODONE-ACETAMINOPHEN 10-325 MG PO TABS: 1.0000 | ORAL_TABLET | ORAL | 0 refills | Status: DC | PRN

## 2017-12-21 MED ORDER — TRIAMTERENE-HCTZ 37.5-25 MG PO CAPS: 1.0000 | ORAL_CAPSULE | Freq: Every day | ORAL | 11 refills | Status: DC

## 2017-12-21 MED ORDER — VITAMIN D (ERGOCALCIFEROL) 1.25 MG (50000 UNIT) PO CAPS: 50000.0000 [IU] | ORAL_CAPSULE | ORAL | 5 refills | Status: DC

## 2017-12-21 MED ORDER — PRENATE PIXIE 10-0.6-0.4-200 MG PO CAPS: 1.0000 | ORAL_CAPSULE | Freq: Every day | ORAL | 12 refills | Status: DC

## 2017-12-21 MED ORDER — CARVEDILOL 12.5 MG PO TABS: 12.5000 mg | ORAL_TABLET | Freq: Two times a day (BID) | ORAL | 11 refills | Status: DC

## 2017-12-21 NOTE — Progress Notes (Signed)
Patient ID: Olivia [name], female   DOB: May 09, 1986, 32 y.o.   MRN: [id number]  Chief Complaint  Patient presents with  . Wound Check  . Hypertension    B/P check    HPI Olivia [name] is a 32 y.o. female.  Patient is chronic hypertensive.  Had C/S on 12-06-2017.  Presents for BP check. HPI  Past Medical History:  Diagnosis Date  . Anxiety   . Bipolar 1 disorder (HCC)   . Cocaine abuse (HCC)   . Gestational diabetes   . Hypertension   . Migraine   . Ovarian cyst   . PTSD (post-traumatic stress disorder)     Past Surgical History:  Procedure Laterality Date  . CESAREAN SECTION N/A 12/06/2017   Procedure: CESAREAN SECTION;  Surgeon: Tilda BurrowFerguson, John V, MD;  [facility];  Service: Obstetrics;  Laterality: N/A;  . WISDOM TOOTH EXTRACTION      Family History  Problem Relation Age of Onset  . Hypertension Mother   . Migraines Mother   . Anxiety disorder Mother   . Depression Mother   . Seizures Mother   . Hypertension Father   . Cancer Father        prostate  . Diabetes Father   . Anxiety disorder Father   . Bipolar disorder Father   . Depression Father   . OCD Father   . Heart disease Maternal Grandmother   . Heart attack Maternal Grandmother 78  . ADD / ADHD Brother   . Schizophrenia Maternal Uncle     Social History Social History   Tobacco Use  . Smoking status: Current Every Day Smoker    Packs/day: 1.00    Types: Cigarettes  . Smokeless tobacco: Never Used  . Tobacco comment: Has cut down to 2 a day  Substance Use Topics  . Alcohol use: Yes    Comment: socially  . Drug use: No    Comment: used cocaine on Monday     Allergies  Allergen Reactions  . Ibuprofen Anaphylaxis and Swelling    Current Outpatient Medications  Medication Sig Dispense Refill  . glucose blood (ACCU-CHEK GUIDE) test strip Use as instructed 50 each 12  . loratadine (CLARITIN) 10 MG tablet Take 1 tablet (10 mg total) daily by mouth. (Patient taking  differently: Take 10 mg by mouth daily as needed for allergies. ) 30 tablet 11  . Prenat-FeAsp-Meth-FA-DHA w/o A (PRENATE PIXIE) 10-0.6-0.4-200 MG CAPS Take 1 tablet by mouth daily. 30 capsule 12  . Vitamin D, Ergocalciferol, (DRISDOL) 50000 units CAPS capsule Take 1 capsule (50,000 Units total) by mouth every 7 (seven) days. 4 capsule 5  . acetaminophen (TYLENOL) 500 MG tablet Take 500 mg by mouth every 6 (six) hours as needed for mild pain or headache.    . calcium carbonate (TUMS - DOSED IN MG ELEMENTAL CALCIUM) 500 MG chewable tablet Chew 1 tablet by mouth 2 (two) times daily as needed for indigestion or heartburn.    . carvedilol (COREG) 12.5 MG tablet Take 1 tablet (12.5 mg total) by mouth 2 (two) times daily with a meal. 60 tablet 11  . Doxylamine-Pyridoxine (DICLEGIS) 10-10 MG TBEC Take 1 tablet with breakfast and lunch.  Take 2 tablets at bedtime. (Patient not taking: Reported on 12/21/2017) 100 tablet 4  . omeprazole (PRILOSEC) 20 MG capsule Take 1 capsule (20 mg total) 2 (two) times daily before a meal by mouth. (Patient not taking: Reported on 12/21/2017) 60 capsule 5  . ondansetron (  ZOFRAN) 8 MG tablet Take 1 tablet (8 mg total) by mouth every 8 (eight) hours as needed for nausea or vomiting. (Patient not taking: Reported on 12/21/2017) 40 tablet 2  . oxyCODONE (OXY IR/ROXICODONE) 5 MG immediate release tablet Take 1 tablet (5 mg total) by mouth every 6 (six) hours as needed for severe pain. (Patient not taking: Reported on 12/21/2017) 20 tablet 0  . oxyCODONE-acetaminophen (PERCOCET) 10-325 MG tablet Take 1 tablet by mouth every 4 (four) hours as needed for pain. 20 tablet 0  . triamterene-hydrochlorothiazide (DYAZIDE) 37.5-25 MG capsule Take 1 each (1 capsule total) by mouth daily. 30 capsule 11   No current facility-administered medications for this visit.     Review of Systems Review of Systems Constitutional: negative for fatigue and weight loss Respiratory: negative for cough and  wheezing Cardiovascular: negative for chest pain, fatigue and palpitations Gastrointestinal: negative for abdominal pain and change in bowel habits Genitourinary:negative Integument/breast: negative for nipple discharge Musculoskeletal:negative for myalgias Neurological: negative for gait problems and tremors Behavioral/Psych: negative for abusive relationship, depression Endocrine: negative for temperature intolerance      Blood pressure (!) 148/104, pulse 80, weight 206 lb (93.4 kg), unknown if currently breastfeeding.  Physical Exam Physical Exam           General:  Alert and no distress Abdomen:  normal findings: no organomegaly, soft, non-tender and no hernia Incision is clean, dry and intact.    50% of 15 min visit spent on counseling and coordination of care.   Data Reviewed Labs  Assessment     1. Postpartum hypertension Rx - triamterene-hydrochlorothiazide (DYAZIDE) 37.5-25 MG capsule; Take 1 each (1 capsule total) by mouth daily.  Dispense: 30 capsule; Refill: 11 - carvedilol (COREG) 12.5 MG tablet; Take 1 tablet (12.5 mg total) by mouth 2 (two) times daily with a meal.  Dispense: 60 tablet; Refill: 11  2. Postoperative abdominal pain Rx: - oxyCODONE-acetaminophen (PERCOCET) 10-325 MG tablet; Take 1 tablet by mouth every 4 (four) hours as needed for pain.  Dispense: 20 tablet; Refill: 0  3. Low vitamin D level Rx: - Vitamin D, Ergocalciferol, (DRISDOL) 50000 units CAPS capsule; Take 1 capsule (50,000 Units total) by mouth every 7 (seven) days.  Dispense: 4 capsule; Refill: 5  4. Vitamin deficiency Rx: - Prenat-FeAsp-Meth-FA-DHA w/o A (PRENATE PIXIE) 10-0.6-0.4-200 MG CAPS; Take 1 tablet by mouth daily.  Dispense: 30 capsule; Refill: 12   5.  Contraceptive Counseling and Advice - wants Nexplanon   Plan    Follow up in 1 month for 6 week postpartum check-up and Nexplanon insertion No orders of the defined types were placed in this encounter.  Meds ordered  this encounter  Medications  . triamterene-hydrochlorothiazide (DYAZIDE) 37.5-25 MG capsule    Sig: Take 1 each (1 capsule total) by mouth daily.    Dispense:  30 capsule    Refill:  11  . carvedilol (COREG) 12.5 MG tablet    Sig: Take 1 tablet (12.5 mg total) by mouth 2 (two) times daily with a meal.    Dispense:  60 tablet    Refill:  11  . oxyCODONE-acetaminophen (PERCOCET) 10-325 MG tablet    Sig: Take 1 tablet by mouth every 4 (four) hours as needed for pain.    Dispense:  20 tablet    Refill:  0  . Vitamin D, Ergocalciferol, (DRISDOL) 50000 units CAPS capsule    Sig: Take 1 capsule (50,000 Units total) by mouth every 7 (seven) days.  Dispense:  4 capsule    Refill:  5  . Prenat-FeAsp-Meth-FA-DHA w/o A (PRENATE PIXIE) 10-0.6-0.4-200 MG CAPS    Sig: Take 1 tablet by mouth daily.    Dispense:  30 capsule    Refill:  12    Please process coupon: Rx BIN: [id number], RxPCN: [id number], RxGRP: [id number], RxID: [id number]  SUF: 01     Brock Bad MD 12-21-2017

## 2017-12-21 NOTE — Progress Notes (Signed)
Pt presents for B/P check  pt had HTN during pregnancy and Incision check. C-Section on 12/06/17  CC: Pain at bottom of incision area .  8/10x. Currently not on B/P meds,everyday smoker. Per pt taking baby aspirin.  Denies any HA's, no visual changes.

## 2017-12-25 ENCOUNTER — Ambulatory Visit (INDEPENDENT_AMBULATORY_CARE_PROVIDER_SITE_OTHER): Payer: Medicaid Other | Admitting: Psychiatry

## 2017-12-25 ENCOUNTER — Encounter (HOSPITAL_COMMUNITY): Payer: Self-pay | Admitting: Psychiatry

## 2017-12-25 VITALS — BP 123/83 | HR 85 | Ht 61.0 in | Wt 203.4 lb

## 2017-12-25 DIAGNOSIS — R443 Hallucinations, unspecified: Secondary | ICD-10-CM

## 2017-12-25 DIAGNOSIS — G4700 Insomnia, unspecified: Secondary | ICD-10-CM | POA: Diagnosis not present

## 2017-12-25 DIAGNOSIS — R450 Nervousness: Secondary | ICD-10-CM

## 2017-12-25 DIAGNOSIS — Z818 Family history of other mental and behavioral disorders: Secondary | ICD-10-CM | POA: Diagnosis not present

## 2017-12-25 DIAGNOSIS — F17210 Nicotine dependence, cigarettes, uncomplicated: Secondary | ICD-10-CM | POA: Diagnosis not present

## 2017-12-25 DIAGNOSIS — R109 Unspecified abdominal pain: Secondary | ICD-10-CM | POA: Diagnosis not present

## 2017-12-25 DIAGNOSIS — Z62810 Personal history of physical and sexual abuse in childhood: Secondary | ICD-10-CM

## 2017-12-25 DIAGNOSIS — F1410 Cocaine abuse, uncomplicated: Secondary | ICD-10-CM

## 2017-12-25 DIAGNOSIS — F3160 Bipolar disorder, current episode mixed, unspecified: Secondary | ICD-10-CM | POA: Diagnosis not present

## 2017-12-25 DIAGNOSIS — F419 Anxiety disorder, unspecified: Secondary | ICD-10-CM | POA: Diagnosis not present

## 2017-12-25 DIAGNOSIS — F4310 Post-traumatic stress disorder, unspecified: Secondary | ICD-10-CM

## 2017-12-25 DIAGNOSIS — Z560 Unemployment, unspecified: Secondary | ICD-10-CM | POA: Diagnosis not present

## 2017-12-25 MED ORDER — FLUOXETINE HCL 10 MG PO CAPS: ORAL_CAPSULE | ORAL | 0 refills | Status: DC

## 2017-12-25 MED ORDER — QUETIAPINE FUMARATE 100 MG PO TABS: ORAL_TABLET | ORAL | 0 refills | Status: DC
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
## 2018-01-03 ENCOUNTER — Ambulatory Visit (HOSPITAL_COMMUNITY): Payer: Medicaid Other | Admitting: Licensed Clinical Social Worker

## 2018-01-03 ENCOUNTER — Ambulatory Visit: Payer: Self-pay | Admitting: Obstetrics

## 2018-01-04 ENCOUNTER — Ambulatory Visit: Payer: Self-pay | Admitting: Obstetrics

## 2018-01-14 ENCOUNTER — Encounter: Payer: Self-pay | Admitting: Obstetrics

## 2018-01-14 ENCOUNTER — Ambulatory Visit (INDEPENDENT_AMBULATORY_CARE_PROVIDER_SITE_OTHER): Payer: Medicaid Other | Admitting: Obstetrics

## 2018-01-14 DIAGNOSIS — O10919 Unspecified pre-existing hypertension complicating pregnancy, unspecified trimester: Secondary | ICD-10-CM

## 2018-01-14 DIAGNOSIS — F17210 Nicotine dependence, cigarettes, uncomplicated: Secondary | ICD-10-CM

## 2018-01-14 DIAGNOSIS — Z1389 Encounter for screening for other disorder: Secondary | ICD-10-CM | POA: Diagnosis not present

## 2018-01-14 NOTE — Progress Notes (Signed)
Post Partum Exam  Olivia [name] is a 32 y.o. [id number] female who presents for a postpartum visit. She is 5 weeks postpartum following a low cervical transverse Cesarean section. I have fully reviewed the prenatal and intrapartum course. The delivery was at 38 gestational weeks.  Anesthesia: epidural. Postpartum course has been unremarkable. Baby's course has been unremarkable. Baby is feeding by bottle - Gerber Gentle . Bleeding no bleeding. Bowel function is normal. Bladder function is normal. Patient is sexually active. Contraception method is none. Postpartum depression screening:neg EPDS: 0  The following portions of the patient's history were reviewed and updated as appropriate: allergies, current medications, past family history, past medical history, past social history, past surgical history and problem list. Last pap smear done 2018 and was Normal  Review of Systems A comprehensive review of systems was negative.    Objective:  unknown if currently breastfeeding.  General:  alert   Breasts:  inspection negative, no nipple discharge or bleeding, no masses or nodularity palpable  Lungs: clear to auscultation bilaterally  Heart:  regular rate and rhythm, S1, S2 normal, no murmur, click, rub or gallop  Abdomen: normal findings: no masses palpable, no organomegaly, soft, non-tender and incision is C, D, I.               Extremities: No C, C, E   Assessment:    1. Postpartum care following cesarean delivery - doing well  2. Chronic hypertension during pregnancy, antepartum - managed by PCP  3. Tobacco dependence - smoking cessation recommended with the aid of medicine and behavioral modification  Plan:   1. Contraception: undecided.  She is a smoker and chronic hypertensive.  Advised of best non-E2 contraceptive choices. 2. Continue PNV's 3. Follow up in: 6 months or as needed.   Brock BadHARLES A. HARPER MD 01-14-2018

## 2018-01-28 ENCOUNTER — Other Ambulatory Visit (HOSPITAL_COMMUNITY): Payer: Self-pay | Admitting: Psychiatry

## 2018-01-28 DIAGNOSIS — F4310 Post-traumatic stress disorder, unspecified: Secondary | ICD-10-CM

## 2018-01-29 ENCOUNTER — Ambulatory Visit (HOSPITAL_COMMUNITY): Payer: Medicaid Other | Admitting: Psychiatry

## 2018-02-05 ENCOUNTER — Telehealth: Payer: Self-pay

## 2018-02-05 DIAGNOSIS — B379 Candidiasis, unspecified: Secondary | ICD-10-CM

## 2018-02-05 MED ORDER — FLUCONAZOLE 150 MG PO TABS: 150.0000 mg | ORAL_TABLET | Freq: Once | ORAL | 0 refills | Status: AC

## 2018-02-05 NOTE — Telephone Encounter (Signed)
Pt called complaining of vaginal discharge and itching. She denies having any odor. She states that sx has been present for about 2 days. Pt is requesting rx for diflucan. Rx sent per protocol

## 2018-02-09 ENCOUNTER — Other Ambulatory Visit (HOSPITAL_COMMUNITY): Payer: Self-pay | Admitting: Psychiatry

## 2018-02-09 DIAGNOSIS — F4310 Post-traumatic stress disorder, unspecified: Secondary | ICD-10-CM

## 2018-02-09 MED ORDER — FLUOXETINE HCL 20 MG PO CAPS: ORAL_CAPSULE | ORAL | 0 refills | Status: DC

## 2018-02-14 ENCOUNTER — Encounter (HOSPITAL_COMMUNITY): Payer: Self-pay | Admitting: Psychiatry

## 2018-02-14 ENCOUNTER — Ambulatory Visit (INDEPENDENT_AMBULATORY_CARE_PROVIDER_SITE_OTHER): Payer: Medicaid Other | Admitting: Psychiatry

## 2018-02-14 DIAGNOSIS — F3160 Bipolar disorder, current episode mixed, unspecified: Secondary | ICD-10-CM

## 2018-02-14 DIAGNOSIS — F4310 Post-traumatic stress disorder, unspecified: Secondary | ICD-10-CM

## 2018-02-14 DIAGNOSIS — Z818 Family history of other mental and behavioral disorders: Secondary | ICD-10-CM

## 2018-02-14 DIAGNOSIS — F17210 Nicotine dependence, cigarettes, uncomplicated: Secondary | ICD-10-CM | POA: Diagnosis not present

## 2018-02-14 MED ORDER — FLUOXETINE HCL 10 MG PO CAPS: 30.0000 mg | ORAL_CAPSULE | Freq: Every day | ORAL | 1 refills | Status: DC

## 2018-02-14 MED ORDER — QUETIAPINE FUMARATE 50 MG PO TABS: 50.0000 mg | ORAL_TABLET | Freq: Every day | ORAL | 1 refills | Status: DC
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
## 2018-02-15 ENCOUNTER — Telehealth: Payer: Self-pay

## 2018-02-15 NOTE — Telephone Encounter (Signed)
Returned call, no answer, no option to leave vm 

## 2018-02-19 ENCOUNTER — Telehealth: Payer: Self-pay

## 2018-02-19 NOTE — Telephone Encounter (Signed)
Pt called stating that she has been doing a lot of sweating since delivery. She is concerned that her hormones may be imbalanced. She would like to know if there is anything that can be done to help control this.

## 2018-02-20 NOTE — Telephone Encounter (Signed)
Left detailed message on vm okay per [facility]

## 2018-02-20 NOTE — Telephone Encounter (Signed)
Hormones will balance out over time and symptoms will subside.

## 2018-03-26 ENCOUNTER — Ambulatory Visit (HOSPITAL_COMMUNITY): Payer: Medicaid Other | Admitting: Licensed Clinical Social Worker

## 2018-04-18 ENCOUNTER — Ambulatory Visit (HOSPITAL_COMMUNITY): Payer: Medicaid Other | Admitting: Licensed Clinical Social Worker

## 2018-04-18 ENCOUNTER — Other Ambulatory Visit (HOSPITAL_COMMUNITY): Payer: Self-pay | Admitting: Psychiatry

## 2018-04-18 ENCOUNTER — Other Ambulatory Visit (HOSPITAL_COMMUNITY): Payer: Self-pay

## 2018-04-18 DIAGNOSIS — F3160 Bipolar disorder, current episode mixed, unspecified: Secondary | ICD-10-CM

## 2018-04-18 DIAGNOSIS — F4310 Post-traumatic stress disorder, unspecified: Secondary | ICD-10-CM

## 2018-04-18 MED ORDER — QUETIAPINE FUMARATE 50 MG PO TABS: 50.0000 mg | ORAL_TABLET | Freq: Every day | ORAL | 1 refills | Status: DC

## 2018-04-18 MED ORDER — FLUOXETINE HCL 10 MG PO CAPS: 30.0000 mg | ORAL_CAPSULE | Freq: Every day | ORAL | 1 refills | Status: DC

## 2018-04-23 ENCOUNTER — Ambulatory Visit (HOSPITAL_COMMUNITY): Payer: Medicaid Other | Admitting: Licensed Clinical Social Worker

## 2018-05-07 ENCOUNTER — Ambulatory Visit (HOSPITAL_COMMUNITY): Payer: Medicaid Other | Admitting: Licensed Clinical Social Worker

## 2018-05-07 ENCOUNTER — Encounter (HOSPITAL_COMMUNITY): Payer: Self-pay

## 2018-05-09 ENCOUNTER — Telehealth (HOSPITAL_COMMUNITY): Payer: Self-pay | Admitting: Licensed Clinical Social Worker

## 2018-05-09 NOTE — Telephone Encounter (Signed)
Mailed discharge letter for [employer]ole Food[employer]ie Schlosberg

## 2018-05-20 ENCOUNTER — Ambulatory Visit (HOSPITAL_COMMUNITY): Payer: Self-pay | Admitting: Psychiatry

## 2018-05-30 ENCOUNTER — Ambulatory Visit (HOSPITAL_COMMUNITY): Payer: Medicaid Other | Admitting: Licensed Clinical Social Worker

## 2018-07-15 ENCOUNTER — Ambulatory Visit (HOSPITAL_COMMUNITY)
Admission: EM | Admit: 2018-07-15 | Discharge: 2018-07-15 | Disposition: A | Discharge status: Home / Self Care | Payer: Self-pay | Attending: Family Medicine | Admitting: Family Medicine

## 2018-07-15 ENCOUNTER — Encounter (HOSPITAL_COMMUNITY): Payer: Self-pay

## 2018-07-15 DIAGNOSIS — S39012A Strain of muscle, fascia and tendon of lower back, initial encounter: Secondary | ICD-10-CM

## 2018-07-15 MED ORDER — METHYLPREDNISOLONE ACETATE 80 MG/ML IJ SUSP
INTRAMUSCULAR | Status: AC
  Filled 2018-07-15: qty 1

## 2018-07-15 MED ORDER — METHYLPREDNISOLONE ACETATE 80 MG/ML IJ SUSP
80.0000 mg | Freq: Once | INTRAMUSCULAR | Status: AC
  Administered 2018-07-15: 80 mg via INTRAMUSCULAR

## 2018-07-15 MED ORDER — CYCLOBENZAPRINE HCL 5 MG PO TABS: 5.0000 mg | ORAL_TABLET | Freq: Every day | ORAL | 0 refills | Status: DC
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
## 2018-08-09 ENCOUNTER — Emergency Department (HOSPITAL_BASED_OUTPATIENT_CLINIC_OR_DEPARTMENT_OTHER): Payer: Medicaid Other

## 2018-08-09 ENCOUNTER — Emergency Department (HOSPITAL_BASED_OUTPATIENT_CLINIC_OR_DEPARTMENT_OTHER)
Admission: EM | Admit: 2018-08-09 | Discharge: 2018-08-09 | Disposition: A | Discharge status: Home / Self Care | Payer: Medicaid Other | Attending: Emergency Medicine | Admitting: Emergency Medicine

## 2018-08-09 ENCOUNTER — Other Ambulatory Visit: Payer: Self-pay

## 2018-08-09 ENCOUNTER — Encounter (HOSPITAL_BASED_OUTPATIENT_CLINIC_OR_DEPARTMENT_OTHER): Payer: Self-pay

## 2018-08-09 DIAGNOSIS — I10 Essential (primary) hypertension: Secondary | ICD-10-CM | POA: Diagnosis not present

## 2018-08-09 DIAGNOSIS — F17210 Nicotine dependence, cigarettes, uncomplicated: Secondary | ICD-10-CM | POA: Insufficient documentation

## 2018-08-09 DIAGNOSIS — Z79899 Other long term (current) drug therapy: Secondary | ICD-10-CM | POA: Insufficient documentation

## 2018-08-09 DIAGNOSIS — M546 Pain in thoracic spine: Secondary | ICD-10-CM | POA: Diagnosis not present

## 2018-08-09 MED ORDER — METHOCARBAMOL 500 MG PO TABS: 500.0000 mg | ORAL_TABLET | Freq: Two times a day (BID) | ORAL | 0 refills | Status: DC

## 2018-08-09 MED ORDER — ACETAMINOPHEN 500 MG PO TABS
1000.0000 mg | ORAL_TABLET | Freq: Once | ORAL | Status: AC
  Administered 2018-08-09: 1000 mg via ORAL
  Filled 2018-08-09: qty 2

## 2018-08-09 MED ORDER — METHOCARBAMOL 500 MG PO TABS
500.0000 mg | ORAL_TABLET | Freq: Once | ORAL | Status: AC
  Administered 2018-08-09: 500 mg via ORAL
  Filled 2018-08-09: qty 1
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
## 2018-08-09 NOTE — Discharge Instructions (Signed)
The pain you are experiencing is likely due to muscle strain, you may take tylenol and Robaxin as needed for pain management.  Robaxin can cause drowsiness do not take before driving and do not combine with alcohol.  You may also use ice and heat, and over-the-counter remedies such as Biofreeze gel or salon pas lidocaine patches. The muscle soreness should improve over the next week. Follow up with your family doctor in the next week for a recheck if you are still having symptoms. Return to ED if pain is worsening, you develop weakness or numbness of extremities, or new or concerning symptoms develop.
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
## 2018-09-19 IMAGING — DX DG CHEST 2V
2 series · 2 of 2 positions shown · non-contrast
Comparison: Yesterday

CLINICAL DATA: Hypertension today

EXAM:
CHEST  2 VIEW

[w chest pa]
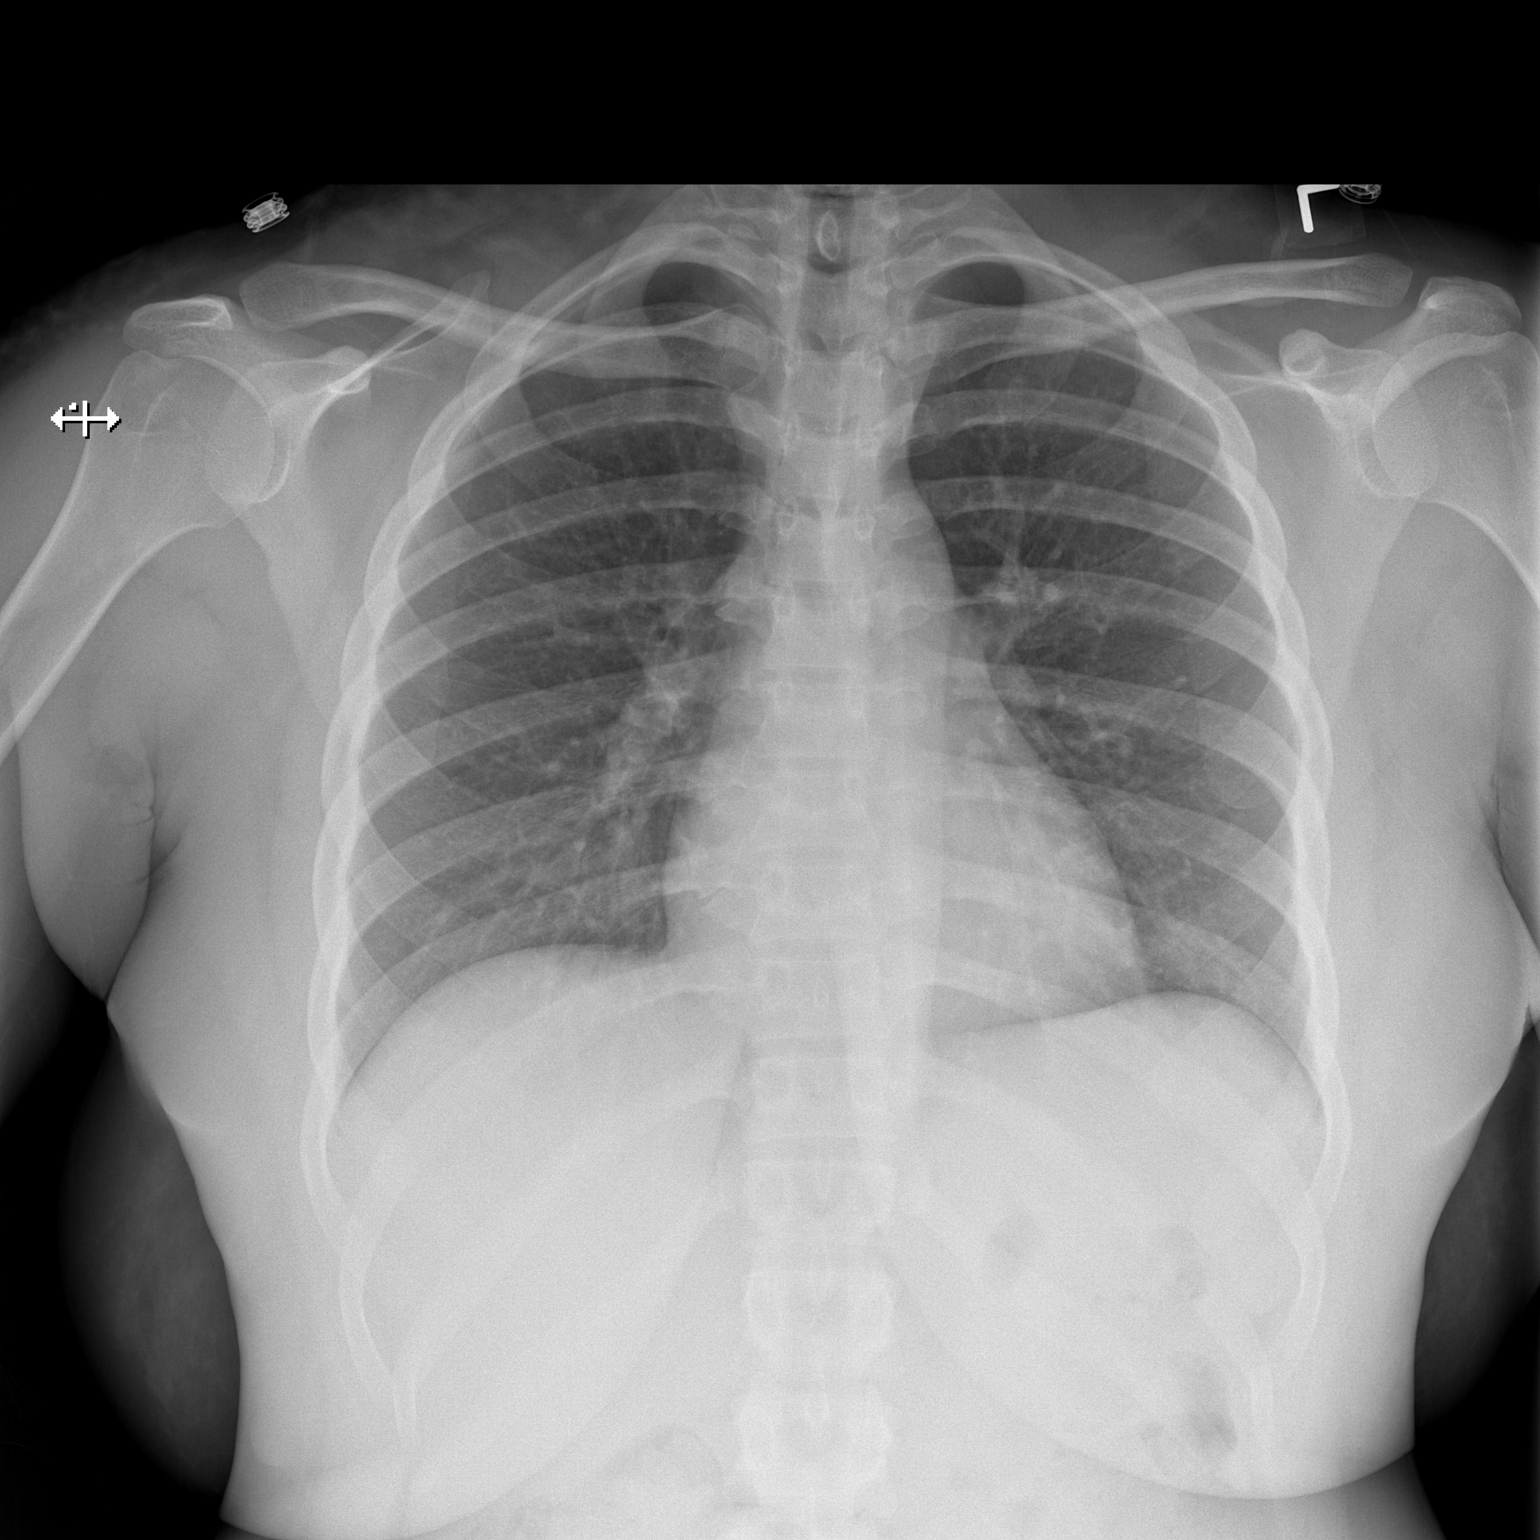

[w chest lat]
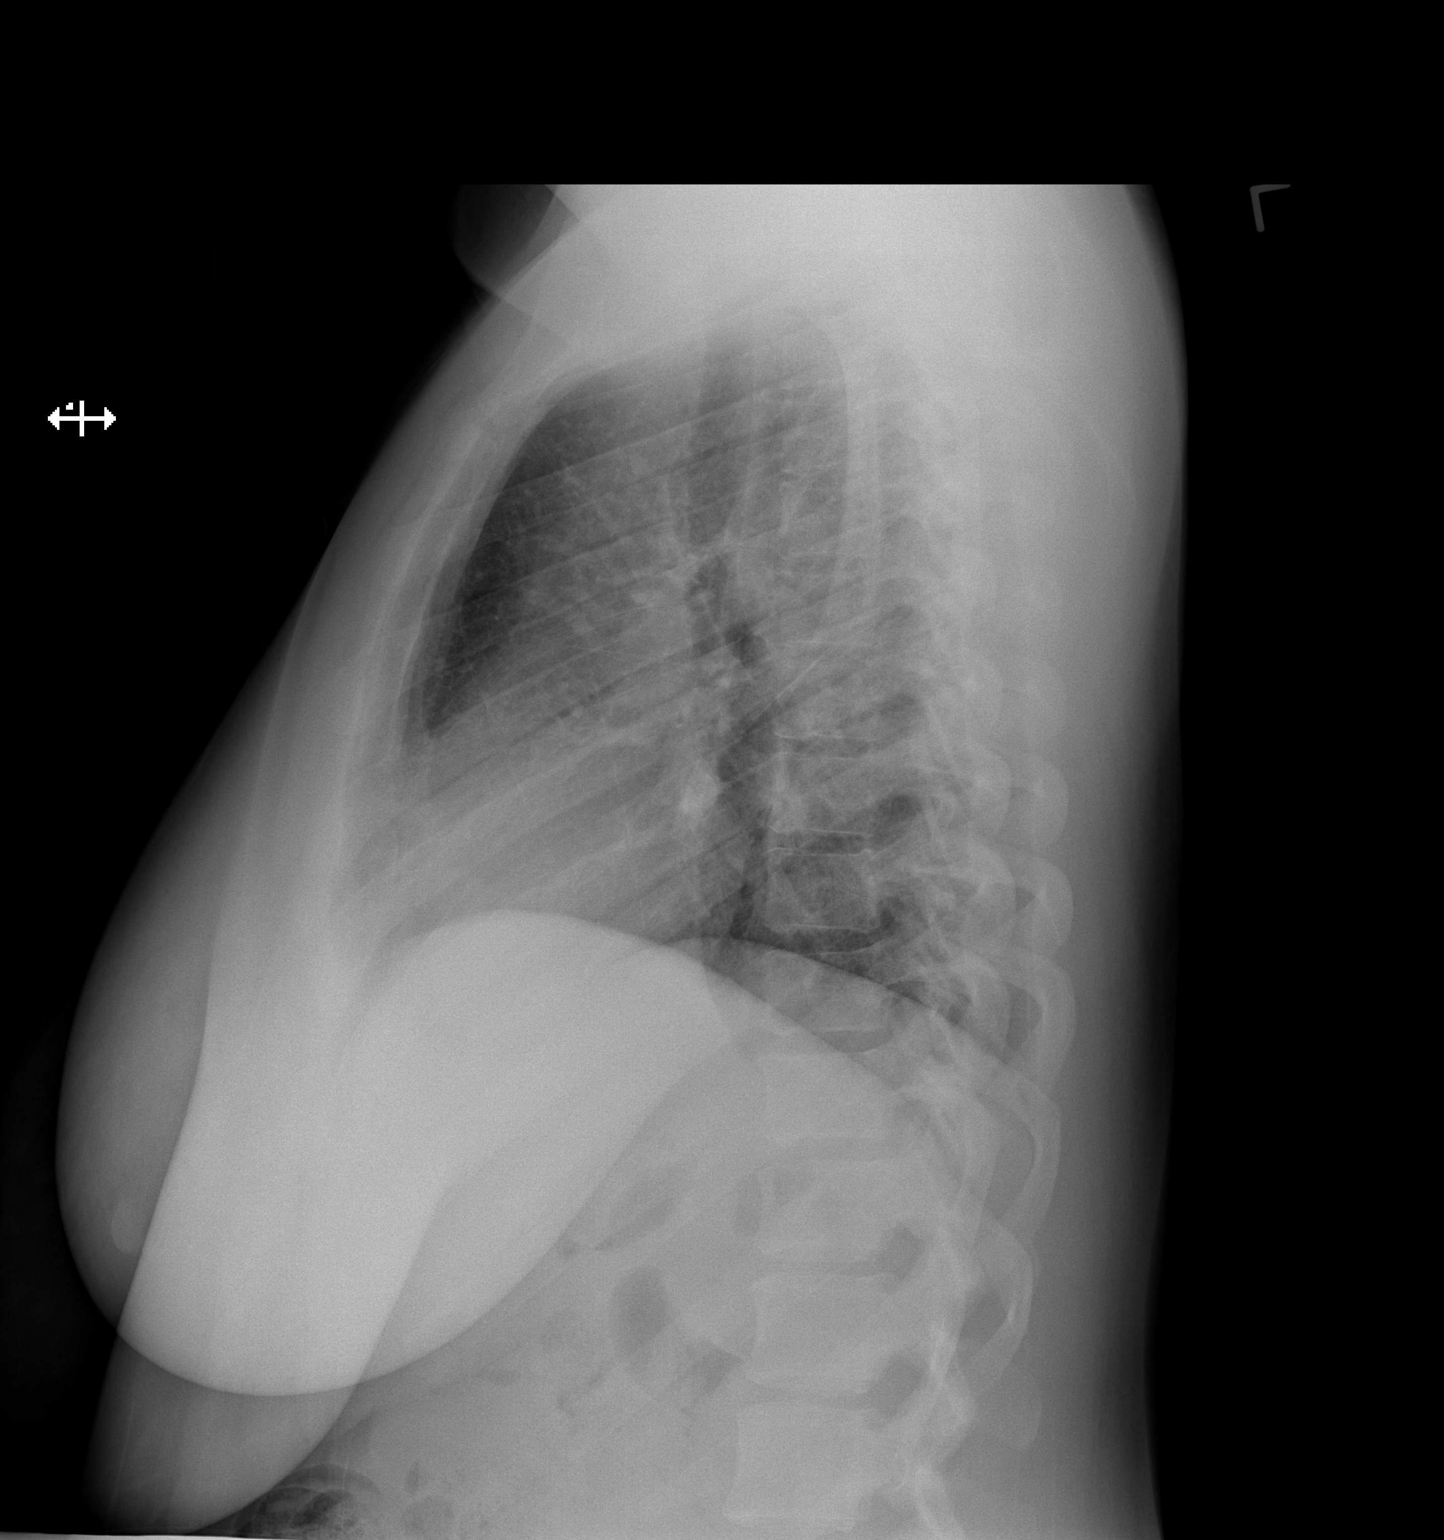

[2 of 2 positions shown; findings below may reference images not displayed]

FINDINGS: Normal heart size. Lungs clear. No pneumothorax. No pleural
effusion.
IMPRESSION: No active cardiopulmonary disease.

## 2018-10-17 ENCOUNTER — Other Ambulatory Visit: Payer: Self-pay | Admitting: Chiropractic Medicine

## 2018-10-17 DIAGNOSIS — M5010 Cervical disc disorder with radiculopathy, unspecified cervical region: Secondary | ICD-10-CM

## 2018-10-17 DIAGNOSIS — S43401A Unspecified sprain of right shoulder joint, initial encounter: Secondary | ICD-10-CM

## 2018-10-19 ENCOUNTER — Encounter

## 2018-10-26 ENCOUNTER — Ambulatory Visit
Admission: RE | Admit: 2018-10-26 | Discharge: 2018-10-26 | Disposition: A | Discharge status: Home / Self Care | Payer: PRIVATE HEALTH INSURANCE | Source: Ambulatory Visit | Attending: Chiropractic Medicine | Admitting: Chiropractic Medicine

## 2018-10-26 ENCOUNTER — Other Ambulatory Visit: Payer: Self-pay

## 2018-10-26 DIAGNOSIS — S43401A Unspecified sprain of right shoulder joint, initial encounter: Secondary | ICD-10-CM

## 2018-10-26 DIAGNOSIS — M5010 Cervical disc disorder with radiculopathy, unspecified cervical region: Secondary | ICD-10-CM

## 2019-03-01 ENCOUNTER — Other Ambulatory Visit: Payer: Self-pay

## 2019-03-01 ENCOUNTER — Inpatient Hospital Stay (HOSPITAL_COMMUNITY)
Admission: AD | Admit: 2019-03-01 | Discharge: 2019-03-01 | Disposition: A | Discharge status: Home / Self Care | Payer: Medicaid Other | Source: Ambulatory Visit | Attending: Obstetrics & Gynecology | Admitting: Obstetrics & Gynecology

## 2019-03-01 ENCOUNTER — Encounter (HOSPITAL_COMMUNITY): Payer: Self-pay | Admitting: *Deleted

## 2019-03-01 ENCOUNTER — Inpatient Hospital Stay (HOSPITAL_COMMUNITY): Payer: Medicaid Other

## 2019-03-01 DIAGNOSIS — O161 Unspecified maternal hypertension, first trimester: Secondary | ICD-10-CM | POA: Diagnosis not present

## 2019-03-01 DIAGNOSIS — F4310 Post-traumatic stress disorder, unspecified: Secondary | ICD-10-CM | POA: Diagnosis not present

## 2019-03-01 DIAGNOSIS — F17210 Nicotine dependence, cigarettes, uncomplicated: Secondary | ICD-10-CM | POA: Diagnosis not present

## 2019-03-01 DIAGNOSIS — O200 Threatened abortion: Secondary | ICD-10-CM | POA: Insufficient documentation

## 2019-03-01 DIAGNOSIS — Z3A08 8 weeks gestation of pregnancy: Secondary | ICD-10-CM | POA: Diagnosis not present

## 2019-03-01 DIAGNOSIS — O208 Other hemorrhage in early pregnancy: Secondary | ICD-10-CM | POA: Diagnosis not present

## 2019-03-01 DIAGNOSIS — O10919 Unspecified pre-existing hypertension complicating pregnancy, unspecified trimester: Secondary | ICD-10-CM

## 2019-03-01 DIAGNOSIS — O99331 Smoking (tobacco) complicating pregnancy, first trimester: Secondary | ICD-10-CM | POA: Insufficient documentation

## 2019-03-01 DIAGNOSIS — O209 Hemorrhage in early pregnancy, unspecified: Secondary | ICD-10-CM

## 2019-03-01 DIAGNOSIS — O99341 Other mental disorders complicating pregnancy, first trimester: Secondary | ICD-10-CM | POA: Insufficient documentation

## 2019-03-01 LAB — URINALYSIS, ROUTINE W REFLEX MICROSCOPIC
Bacteria, UA: NONE SEEN
Bilirubin Urine: NEGATIVE
Glucose, UA: NEGATIVE mg/dL
Hgb urine dipstick: NEGATIVE
Ketones, ur: NEGATIVE mg/dL
Leukocytes,Ua: NEGATIVE
Nitrite: NEGATIVE
Protein, ur: 30 mg/dL — AB
Specific Gravity, Urine: 1.029 (ref 1.005–1.030)
pH: 5 (ref 5.0–8.0)

## 2019-03-01 LAB — CBC
HCT: 34.3 % — ABNORMAL LOW (ref 36.0–46.0)
Hemoglobin: 11.5 g/dL — ABNORMAL LOW (ref 12.0–15.0)
MCH: 30 pg (ref 26.0–34.0)
MCHC: 33.5 g/dL (ref 30.0–36.0)
MCV: 89.6 fL (ref 80.0–100.0)
Platelets: 327 10*3/uL (ref 150–400)
RBC: 3.83 MIL/uL — ABNORMAL LOW (ref 3.87–5.11)
RDW: 12.2 % (ref 11.5–15.5)
WBC: 8.7 10*3/uL (ref 4.0–10.5)
nRBC: 0 % (ref 0.0–0.2)

## 2019-03-01 LAB — HCG, QUANTITATIVE, PREGNANCY: hCG, Beta Chain, Quant, S: 121279 m[IU]/mL — ABNORMAL HIGH (ref <5)

## 2019-03-01 LAB — POCT PREGNANCY, URINE: Preg Test, Ur: POSITIVE — AB

## 2019-03-01 MED ORDER — RHO D IMMUNE GLOBULIN 1500 UNIT/2ML IJ SOSY
300.0000 ug | PREFILLED_SYRINGE | Freq: Once | INTRAMUSCULAR | Status: AC
  Administered 2019-03-01: 300 ug via INTRAMUSCULAR
  Filled 2019-03-01: qty 2

## 2019-03-01 NOTE — Discharge Instructions (Signed)
Your ultrasound was normal and showed that your pregnancy was inside of your uterus, where it should be. With vaginal bleeding in early pregnancy, there is a risk of a miscarriage. Please return on Monday, 5/25 to the MAU in the afternoon to have repeat pregnancy hormone level drawn.    Vaginal Bleeding During Pregnancy, First Trimester  A small amount of bleeding (spotting) from the vagina is common during early pregnancy. Sometimes the bleeding is normal and does not cause problems. At other times, though, bleeding may be a sign of something serious. Tell your doctor about any bleeding from your vagina right away. Follow these instructions at home: Activity  Follow your doctor's instructions about how active you can be.  If needed, make plans for someone to help with your normal activities.  Do not have sex or orgasms until your doctor says that this is safe. General instructions  Take over-the-counter and prescription medicines only as told by your doctor.  Watch your condition for any changes.  Write down: ? The number of pads you use each day. ? How often you change pads. ? How soaked (saturated) your pads are.  Do not use tampons.  Do not douche.  If you pass any tissue from your vagina, save it to show to your doctor.  Keep all follow-up visits as told by your doctor. This is important. Contact a doctor if:  You have vaginal bleeding at any time while you are pregnant.  You have cramps.  You have a fever. Get help right away if:  You have very bad cramps in your back or belly (abdomen).  You pass large clots or a lot of tissue from your vagina.  Your bleeding gets worse.  You feel light-headed.  You feel weak.  You pass out (faint).  You have chills.  You are leaking fluid from your vagina.  You have a gush of fluid from your vagina. Summary  Sometimes vaginal bleeding during pregnancy is normal and does not cause problems. At other times, bleeding  may be a sign of something serious.  Tell your doctor about any bleeding from your vagina right away.  Follow your doctor's instructions about how active you can be. You may need someone to help you with your normal activities. This information is not intended to replace advice given to you by your health care provider. Make sure you discuss any questions you have with your health care provider. Document Released: 02/09/2014 Document Revised: 12/27/2016 Document Reviewed: 12/27/2016 Elsevier Interactive Patient Education  2019 [employer].

## 2019-03-01 NOTE — MAU Note (Signed)
Pt had positive pregnancy test 2 months ago. Started having vaginal bleeding with clots coming out today. Also c/o cramping.

## 2019-03-01 NOTE — MAU Provider Note (Signed)
History     CSN: [id number]  Arrival date and time: 03/01/19 0153   None     Chief Complaint  Patient presents with  . Vaginal Bleeding   HPI   Olivia [name] is 33 y.o. is G19P1021 female at [redacted]w[redacted]d who presents for vaginal bleeding. Reports had +HPT 2 months ago. Vaginal bleeding with clots started today. Had cramping earlier today which has since resolved.   OB History    Gravida  4   Para  1   Term  1   Preterm      AB  2   Living  1     SAB  1   TAB  1   Ectopic      Multiple  0   Live Births  1           Past Medical History:  Diagnosis Date  . Anxiety   . Bipolar 1 disorder (HCC)   . Cocaine abuse (HCC)   . Gestational diabetes   . Hypertension   . Migraine   . Ovarian cyst   . PTSD (post-traumatic stress disorder)     Past Surgical History:  Procedure Laterality Date  . CESAREAN SECTION N/A 12/06/2017   Procedure: CESAREAN SECTION;  Surgeon: Tilda Burrow, MD;  Location: [facility] BIRTHING SUITES;  Service: Obstetrics;  Laterality: N/A;  . WISDOM TOOTH EXTRACTION      Family History  Problem Relation Age of Onset  . Hypertension Mother   . Migraines Mother   . Anxiety disorder Mother   . Depression Mother   . Seizures Mother   . Hypertension Father   . Cancer Father        prostate  . Diabetes Father   . Anxiety disorder Father   . Bipolar disorder Father   . Depression Father   . OCD Father   . Heart disease Maternal Grandmother   . Heart attack Maternal Grandmother 78  . ADD / ADHD Brother   . Schizophrenia Maternal Uncle     Social History   Tobacco Use  . Smoking status: Current Every Day Smoker    Packs/day: 1.00    Types: Cigarettes  . Smokeless tobacco: Never Used  . Tobacco comment: Has cut down to 2 a day  Substance Use Topics  . Alcohol use: Not Currently    Comment: socially  . Drug use: Yes    Types: Cocaine    Comment: not used in a year    Allergies:  Allergies  Allergen Reactions  . Ibuprofen  Anaphylaxis and Swelling    No medications prior to admission.    Review of Systems  Constitutional: Negative for chills and fever.  Respiratory: Negative for cough, chest tightness and shortness of breath.   Cardiovascular: Negative for chest pain.  Gastrointestinal: Positive for abdominal pain. Negative for nausea and vomiting.  Genitourinary: Positive for vaginal bleeding. Negative for dysuria, frequency and vaginal discharge.  Musculoskeletal: Negative for back pain.  Neurological: Negative for dizziness and light-headedness.   Physical Exam   Blood pressure 139/84, pulse 89, temperature 97.9 F (36.6 C), temperature source Oral, resp. rate 16, height 5\' 1"  (1.549 m), weight 70.8 kg, last menstrual period 01/04/2019, not currently breastfeeding.  Physical Exam  Constitutional: She is oriented to person, place, and time. She appears well-developed and well-nourished. No distress.  HENT:  Head: Normocephalic and atraumatic.  Eyes: Conjunctivae and EOM are normal.  Neck: Normal range of motion. Neck supple.  Cardiovascular: Normal  rate and regular rhythm.  Respiratory: Effort normal and breath sounds normal. No respiratory distress.  GI: Soft. Bowel sounds are normal. She exhibits no distension. There is no abdominal tenderness. There is no rebound and no guarding.  Genitourinary:    Genitourinary Comments: Declines pelvic exam and swabs.    Musculoskeletal: Normal range of motion.        General: No edema.  Neurological: She is alert and oriented to person, place, and time.  Skin: Skin is warm and dry. She is not diaphoretic.  Psychiatric: She has a normal mood and affect. Her behavior is normal.    MAU Course  Procedures  MDM Upreg positive. Given pregnancy of unknown anatomical location with vaginal bleeding and cramping obtained b-hcg, CBC, and transvaginal ultrasound. Noted that patient is O negative blood type and warrants Rhogam.   [place] Ob Comp Less 14 Wks  Result  Date: 03/01/2019 CLINICAL DATA:  33 y/o F; bleeding and cramping. Positive urine pregnancy test. EXAM: OBSTETRIC <14 WK ULTRASOUND TECHNIQUE: Transabdominal ultrasound was performed for evaluation of the gestation as well as the maternal uterus and adnexal regions. COMPARISON:  None. FINDINGS: Intrauterine gestational sac: Single Yolk sac:  Visualized. Embryo:  Visualized. Cardiac Activity: Visualized. Heart Rate: 164 bpm CRL: 20.2 mm   8 w 4 d                  US EDC: 10/07/2019 Subchorionic hemorrhage:  None visualized. Maternal uterus/adnexae: Normal appearance of the ovaries bilaterally. Left ovary corpus luteum cyst. IMPRESSION: Single live intrauterine pregnancy with estimated gestational age of [redacted] weeks and 4 days. No acute process is evident. Electronically Signed   By: Mitzi HansenLance  Furusawa-Stratton M.D.   On: 03/01/2019 03:37    Assessment and Plan    1. [redacted] weeks gestation of pregnancy   2. Vaginal bleeding affecting early pregnancy   3. Chronic hypertension affecting pregnancy   4. Threatened abortion     Ultrasound shows single live IUP at 2972w4d without subchorionic hemorrhage. Discussed findings of ultrasound with patient and that still considered threatened abortion given vaginal bleeding in early pregnancy. Patient declines pelvic exam and cultures. Return for bhcg in 48 hours in MAU on 5/25. Rhogam given. Pelvic precautions discussed. Strict return precautions advised.   De HollingsheadCatherine L Wallace 03/01/2019, 8:30 PM

## 2019-03-02 LAB — RH IG WORKUP (INCLUDES ABO/RH)
ABO/RH(D): O NEG
Antibody Screen: NEGATIVE
Gestational Age(Wks): 8
Unit division: 0

## 2019-04-09 ENCOUNTER — Other Ambulatory Visit: Payer: Self-pay

## 2019-04-09 ENCOUNTER — Encounter (HOSPITAL_BASED_OUTPATIENT_CLINIC_OR_DEPARTMENT_OTHER): Payer: Self-pay | Admitting: *Deleted

## 2019-04-09 ENCOUNTER — Emergency Department (HOSPITAL_BASED_OUTPATIENT_CLINIC_OR_DEPARTMENT_OTHER)
Admission: EM | Admit: 2019-04-09 | Discharge: 2019-04-09 | Disposition: A | Discharge status: Home / Self Care | Payer: Medicaid Other | Attending: Emergency Medicine | Admitting: Emergency Medicine

## 2019-04-09 DIAGNOSIS — F17210 Nicotine dependence, cigarettes, uncomplicated: Secondary | ICD-10-CM | POA: Diagnosis not present

## 2019-04-09 DIAGNOSIS — O10912 Unspecified pre-existing hypertension complicating pregnancy, second trimester: Secondary | ICD-10-CM | POA: Insufficient documentation

## 2019-04-09 DIAGNOSIS — R8271 Bacteriuria: Secondary | ICD-10-CM

## 2019-04-09 DIAGNOSIS — B9689 Other specified bacterial agents as the cause of diseases classified elsewhere: Secondary | ICD-10-CM | POA: Diagnosis not present

## 2019-04-09 DIAGNOSIS — O99891 Other specified diseases and conditions complicating pregnancy: Secondary | ICD-10-CM

## 2019-04-09 DIAGNOSIS — B373 Candidiasis of vulva and vagina: Secondary | ICD-10-CM

## 2019-04-09 DIAGNOSIS — B3731 Acute candidiasis of vulva and vagina: Secondary | ICD-10-CM

## 2019-04-09 DIAGNOSIS — Z3A14 14 weeks gestation of pregnancy: Secondary | ICD-10-CM | POA: Diagnosis not present

## 2019-04-09 DIAGNOSIS — O99332 Smoking (tobacco) complicating pregnancy, second trimester: Secondary | ICD-10-CM | POA: Diagnosis not present

## 2019-04-09 DIAGNOSIS — N760 Acute vaginitis: Secondary | ICD-10-CM

## 2019-04-09 DIAGNOSIS — O2392 Unspecified genitourinary tract infection in pregnancy, second trimester: Secondary | ICD-10-CM | POA: Insufficient documentation

## 2019-04-09 DIAGNOSIS — O23592 Infection of other part of genital tract in pregnancy, second trimester: Secondary | ICD-10-CM | POA: Insufficient documentation

## 2019-04-09 DIAGNOSIS — O9989 Other specified diseases and conditions complicating pregnancy, childbirth and the puerperium: Secondary | ICD-10-CM | POA: Diagnosis not present

## 2019-04-09 LAB — URINALYSIS, ROUTINE W REFLEX MICROSCOPIC
Bilirubin Urine: NEGATIVE
Glucose, UA: NEGATIVE mg/dL
Hgb urine dipstick: NEGATIVE
Ketones, ur: NEGATIVE mg/dL
Nitrite: POSITIVE — AB
Protein, ur: NEGATIVE mg/dL
Specific Gravity, Urine: <1.005 — ABNORMAL LOW (ref 1.005–1.030)
pH: 7 (ref 5.0–8.0)

## 2019-04-09 LAB — URINALYSIS, MICROSCOPIC (REFLEX)

## 2019-04-09 LAB — WET PREP, GENITAL
Sperm: NONE SEEN
Trich, Wet Prep: NONE SEEN

## 2019-04-09 MED ORDER — CEPHALEXIN 500 MG PO CAPS: 500.0000 mg | ORAL_CAPSULE | Freq: Three times a day (TID) | ORAL | 0 refills | Status: AC

## 2019-04-09 MED ORDER — CLOTRIMAZOLE 1 % VA CREA: 1.0000 | TOPICAL_CREAM | Freq: Every day | VAGINAL | 0 refills | Status: AC

## 2019-04-09 MED ORDER — METRONIDAZOLE 500 MG PO TABS: 500.0000 mg | ORAL_TABLET | Freq: Two times a day (BID) | ORAL | 0 refills | Status: AC

## 2019-04-09 NOTE — Discharge Instructions (Signed)
You were evaluated in the Emergency Department and after careful evaluation, we did not find any emergent condition requiring admission or further testing in the hospital.  Your symptoms today seem to be due to a combination of bacterial vaginosis and a yeast infection.  Please take the medications provided as directed and follow-up with your regular doctors.  Please return to the Emergency Department if you experience any worsening of your condition.  We encourage you to follow up with a primary care provider.  Thank you for allowing [place] to be a part of your care.

## 2019-04-09 NOTE — ED Provider Notes (Signed)
[facility] Emergency Department Provider Note MRN:  [id number]  Arrival date & time: 04/09/19     Chief Complaint   Vaginal Discharge   History of Present Illness   Olivia [name] is a 33 y.o. year-old female with a history of bipolar disorder, presenting to the ED with chief complaint of vaginal discharge.  2 days of increased vaginal discharge, white, thick.  Mild vaginal irritation as well.  Denies fever, no chest pain or shortness of breath, no abdominal pain.  Patient is [redacted] weeks pregnant.  Review of Systems  A problem-focused ROS was performed. Positive for vaginal discharge.  Patient denies abdominal pain.  Patient's Health History    Past Medical History:  Diagnosis Date  . Anxiety   . Bipolar 1 disorder ([place])   . Cocaine abuse ([place])   . Gestational diabetes   . Hypertension   . Migraine   . Ovarian cyst   . PTSD (post-traumatic stress disorder)     Past Surgical History:  Procedure Laterality Date  . CESAREAN SECTION N/A 12/06/2017   Procedure: CESAREAN SECTION;  Surgeon: Jonnie Kind, MD;  Location: [place];  Service: Obstetrics;  Laterality: N/A;  . WISDOM TOOTH EXTRACTION      Family History  Problem Relation Age of Onset  . Hypertension Mother   . Migraines Mother   . Anxiety disorder Mother   . Depression Mother   . Seizures Mother   . Hypertension Father   . Cancer Father        prostate  . Diabetes Father   . Anxiety disorder Father   . Bipolar disorder Father   . Depression Father   . OCD Father   . Heart disease Maternal Grandmother   . Heart attack Maternal Grandmother 78  . ADD / ADHD Brother   . Schizophrenia Maternal Uncle     Social History   Socioeconomic History  . Marital status: Single    Spouse name: Not on file  . Number of children: Not on file  . Years of education: Not on file  . Highest education level: Associate degree: occupational, Hotel manager, or vocational program   Occupational History  . Not on file  Social Needs  . Financial resource strain: Somewhat hard  . Food insecurity    Worry: Never true    Inability: Never true  . Transportation needs    Medical: No    Non-medical: No  Tobacco Use  . Smoking status: Current Every Day Smoker    Packs/day: 1.00    Types: Cigarettes  . Smokeless tobacco: Never Used  . Tobacco comment: Has cut down to 2 a day  Substance and Sexual Activity  . Alcohol use: Not Currently    Comment: socially  . Drug use: Yes    Types: Cocaine    Comment: not used in a year  . Sexual activity: Yes    Partners: Male    Birth control/protection: None, Condom  Lifestyle  . Physical activity    Days per week: 6 days    Minutes per session: Not on file  . Stress: Rather much  Relationships  . Social connections    Talks on phone: More than three times a week    Gets together: Once a week    Attends religious service: More than 4 times per year    Active member of club or organization: No    Attends meetings of clubs or organizations: Never  Relationship status: Never married  . Intimate partner violence    Fear of current or ex partner: No    Emotionally abused: No    Physically abused: No    Forced sexual activity: No  Other Topics Concern  . Not on file  Social History Narrative   [place], she is currently not working. She is pregnant with her first child.     Physical Exam  Vital Signs and Nursing Notes reviewed Vitals:   04/09/19 1400  BP: 126/82  Pulse: 94  Resp: 18  Temp: 98.8 F (37.1 C)  SpO2: 100%    CONSTITUTIONAL: Well-appearing, NAD NEURO:  Alert and oriented x 3, no focal deficits EYES:  eyes equal and reactive ENT/NECK:  no LAD, no JVD CARDIO: Regular rate, well-perfused, normal S1 and S2 PULM:  CTAB no wheezing or rhonchi GI/GU:  normal bowel sounds, non-distended, non-tender; normal appearing external genitalia, no adnexal  tenderness or masses, no cervical motion tenderness, copious thick white discharge in the vaginal vault, exam chaperoned by female ED tech MSK/SPINE:  No gross deformities, no edema SKIN:  no rash, atraumatic PSYCH:  Appropriate speech and behavior  Diagnostic and Interventional Summary    Labs Reviewed  WET PREP, GENITAL - Abnormal; Notable for the following components:      Result Value   Yeast Wet Prep HPF POC PRESENT (*)    Clue Cells Wet Prep HPF POC PRESENT (*)    WBC, Wet Prep HPF POC MANY (*)    All other components within normal limits  URINALYSIS, ROUTINE W REFLEX MICROSCOPIC - Abnormal; Notable for the following components:   Specific Gravity, Urine <1.005 (*)    Nitrite POSITIVE (*)    Leukocytes,Ua TRACE (*)    All other components within normal limits  URINALYSIS, MICROSCOPIC (REFLEX) - Abnormal; Notable for the following components:   Bacteria, UA RARE (*)    All other components within normal limits  GC/CHLAMYDIA PROBE AMP ([place]) NOT AT [facility]    No orders to display    Medications - No data to display   Procedures Critical Care  ED Course and Medical Decision Making  I have reviewed the triage vital signs and the nursing notes.  Pertinent labs & imaging results that were available during my care of the patient were reviewed by me and considered in my medical decision making (see below for details).  Nothing to suggest PID or intra-abdominal process.  Benign abdomen.  Consistent with BV/yeast infection.  Patient also has bacteriuria, which we will treat in pregnancy.  After the discussed management above, the patient was determined to be safe for discharge.  The patient was in agreement with this plan and all questions regarding their care were answered.  ED return precautions were discussed and the patient will return to the ED with any significant worsening of condition.  Elmer SowMichael M. Pilar PlateBero, MD [facility]  [email or web address]  Final Clinical Impressions(s) / ED Diagnoses     ICD-10-CM   1. BV (bacterial vaginosis)  N76.0    B96.89   2. Bacteriuria during pregnancy  O99.89    R82.71   3. Vaginal yeast infection  B37.3     ED Discharge Orders         Ordered    cephALEXin (KEFLEX) 500 MG capsule  3 times daily     04/09/19 1541    metroNIDAZOLE (FLAGYL) 500 MG tablet  2  times daily     04/09/19 1541    clotrimazole (GYNE-LOTRIMIN) 1 % vaginal cream  Daily at bedtime     04/09/19 1541             Sabas SousBero, Taira Knabe M, MD 04/09/19 1545

## 2019-04-09 NOTE — ED Triage Notes (Signed)
Pt c/o vaginal discharge x 1 week , pt is 14 weeks preg, denies vaginal bleeding

## 2019-04-10 LAB — GC/CHLAMYDIA PROBE AMP (~~LOC~~) NOT AT ARMC
Chlamydia: NEGATIVE
Neisseria Gonorrhea: NEGATIVE

## 2019-04-25 ENCOUNTER — Other Ambulatory Visit: Payer: Self-pay

## 2019-04-25 ENCOUNTER — Encounter (HOSPITAL_COMMUNITY): Payer: Self-pay

## 2019-04-25 ENCOUNTER — Ambulatory Visit (HOSPITAL_COMMUNITY)
Admission: EM | Admit: 2019-04-25 | Discharge: 2019-04-25 | Disposition: A | Discharge status: Home / Self Care | Payer: Medicaid Other | Attending: Urgent Care | Admitting: Urgent Care

## 2019-04-25 DIAGNOSIS — N898 Other specified noninflammatory disorders of vagina: Secondary | ICD-10-CM | POA: Diagnosis not present

## 2019-04-25 LAB — POCT URINALYSIS DIP (DEVICE)
Bilirubin Urine: NEGATIVE
Glucose, UA: NEGATIVE mg/dL
Hgb urine dipstick: NEGATIVE
Ketones, ur: NEGATIVE mg/dL
Leukocytes,Ua: NEGATIVE
Nitrite: NEGATIVE
Protein, ur: NEGATIVE mg/dL
Specific Gravity, Urine: 1.02 (ref 1.005–1.030)
Urobilinogen, UA: 0.2 mg/dL (ref 0.0–1.0)
pH: 7.5 (ref 5.0–8.0)

## 2019-04-25 NOTE — ED Triage Notes (Signed)
Patient presents to Urgent Care with complaints of BV, yeast infection, and UTI that she does not think has completely cleared up since being diagnosed and treated 3 weeks ago. Patient reports she is 4 months pregnant.

## 2019-04-25 NOTE — ED Provider Notes (Signed)
[place]    CSN: [id number] Arrival date & time: 04/25/19  1201     History   Chief Complaint Chief Complaint  Patient presents with  . Vaginal Discharge    HPI FEMALE Olivia [name] is a 33 y.o. female.   Presents with white vaginal discharge x1 week.  She was seen in the ED: Emergency department on 04/09/2019 and was treated with Keflex, Flagyl, clotrimazole cream.  She states her symptoms cleared and then returned after she "started drinking sodas again".  She denies sexual activity since her previous treatment.  She is currently 4 months pregnant.  She denies fever, chills, abdominal pain, pelvic pain, dysuria.     The history is provided by the patient.    Past Medical History:  Diagnosis Date  . Anxiety   . Bipolar 1 disorder ([place])   . Cocaine abuse ([place])   . Gestational diabetes   . Hypertension   . Migraine   . Ovarian cyst   . PTSD (post-traumatic stress disorder)     Patient Active Problem List   Diagnosis Date Noted  . Status post primary low transverse cesarean section 12/06/2017  . PROM (premature rupture of membranes) 12/05/2017  . Chronic hypertension during pregnancy, antepartum 11/07/2017  . Diabetes in pregnancy 07/19/2017  . Cocaine use complicating pregnancy, second trimester 06/28/2017  . Low vitamin D level 06/25/2017  . Rh negative state in antepartum period 06/25/2017  . Elevated hemoglobin A1c 06/25/2017  . Obesity affecting pregnancy in second trimester 06/20/2017  . Late prenatal care affecting pregnancy 06/20/2017  . Tobacco use affecting pregnancy, antepartum 06/20/2017  . MDD (major depressive disorder), recurrent severe, without psychosis ([place]) 09/23/2016  . Supervision of high risk pregnancy, antepartum 12/03/2014    Past Surgical History:  Procedure Laterality Date  . CESAREAN SECTION N/A 12/06/2017   Procedure: CESAREAN SECTION;  Surgeon: Jonnie Kind, MD;  Location: [place];  Service: Obstetrics;   Laterality: N/A;  . WISDOM TOOTH EXTRACTION      OB History    Gravida  4   Para  1   Term  1   Preterm      AB  2   Living  1     SAB  1   TAB  1   Ectopic      Multiple  0   Live Births  1            Home Medications    Prior to Admission medications   Medication Sig Start Date End Date Taking? Authorizing Provider  acetaminophen (TYLENOL) 500 MG tablet Take 500 mg by mouth every 6 (six) hours as needed for mild pain or headache.    [provider]  calcium carbonate (TUMS - DOSED IN MG ELEMENTAL CALCIUM) 500 MG chewable tablet Chew 1 tablet by mouth 2 (two) times daily as needed for indigestion or heartburn.    [provider]  cyclobenzaprine (FLEXERIL) 5 MG tablet Take 1 tablet (5 mg total) by mouth at bedtime. 07/15/18   Robyn Haber, MD  Doxylamine-Pyridoxine (DICLEGIS) 10-10 MG TBEC Take 1 tablet with breakfast and lunch.  Take 2 tablets at bedtime. 06/20/17   Kandis Cocking A, CNM  glucose blood (ACCU-CHEK GUIDE) test strip Use as instructed 07/26/17   Truett Mainland, DO  loratadine (CLARITIN) 10 MG tablet Take 1 tablet (10 mg total) daily by mouth. Patient taking differently: Take 10 mg by mouth daily as needed for allergies.  08/16/17  Brock BadHarper, Charles A, MD  methocarbamol (ROBAXIN) 500 MG tablet Take 1 tablet (500 mg total) by mouth 2 (two) times daily. 08/09/18   Dartha LodgeFord, Kelsey N, PA-C  omeprazole (PRILOSEC) 20 MG capsule Take 1 capsule (20 mg total) 2 (two) times daily before a meal by mouth. 08/16/17   Brock BadHarper, Charles A, MD  Prenat-FeAsp-Meth-FA-DHA w/o A (PRENATE PIXIE) 10-0.6-0.4-200 MG CAPS Take 1 tablet by mouth daily. 12/21/17   Brock BadHarper, Charles A, MD  QUEtiapine (SEROQUEL) 50 MG tablet Take 1 tablet (50 mg total) by mouth at bedtime. 04/18/18   Arfeen, Phillips GroutSyed T, MD  triamterene-hydrochlorothiazide (DYAZIDE) 37.5-25 MG capsule Take 1 each (1 capsule total) by mouth daily. 12/21/17   Brock BadHarper, Charles A, MD  Vitamin D, Ergocalciferol,  (DRISDOL) 50000 units CAPS capsule Take 1 capsule (50,000 Units total) by mouth every 7 (seven) days. 12/21/17   Brock BadHarper, Charles A, MD    Family History Family History  Problem Relation Age of Onset  . Hypertension Mother   . Migraines Mother   . Anxiety disorder Mother   . Depression Mother   . Seizures Mother   . Hypertension Father   . Cancer Father        prostate  . Diabetes Father   . Anxiety disorder Father   . Bipolar disorder Father   . Depression Father   . OCD Father   . Heart disease Maternal Grandmother   . Heart attack Maternal Grandmother 78  . ADD / ADHD Brother   . Schizophrenia Maternal Uncle     Social History Social History   Tobacco Use  . Smoking status: Current Every Day Smoker    Packs/day: 1.00    Types: Cigarettes  . Smokeless tobacco: Never Used  . Tobacco comment: 5 cigarettes per day (pregnant)  Substance Use Topics  . Alcohol use: Not Currently    Comment: socially  . Drug use: Yes    Types: Cocaine    Comment: not used in a year     Allergies   Ibuprofen   Review of Systems Review of Systems  Constitutional: Negative for chills and fever.  HENT: Negative for ear pain and sore throat.   Eyes: Negative for pain and visual disturbance.  Respiratory: Negative for cough and shortness of breath.   Cardiovascular: Negative for chest pain and palpitations.  Gastrointestinal: Negative for abdominal pain and vomiting.  Genitourinary: Positive for vaginal discharge. Negative for dysuria, flank pain, hematuria and pelvic pain.  Musculoskeletal: Negative for arthralgias and back pain.  Skin: Negative for color change and rash.  Neurological: Negative for seizures and syncope.  All other systems reviewed and are negative.    Physical Exam Triage Vital Signs ED Triage Vitals  Enc Vitals Group     BP 04/25/19 1312 125/81     Pulse Rate 04/25/19 1312 65     Resp 04/25/19 1312 16     Temp 04/25/19 1312 98.4 F (36.9 C)     Temp  Source 04/25/19 1312 Oral     SpO2 04/25/19 1312 100 %     Weight --      Height --      Head Circumference --      Peak Flow --      Pain Score 04/25/19 1309 0     Pain Loc --      Pain Edu? --      Excl. in GC? --    No data found.  Updated Vital Signs BP 125/81 (BP  Location: Right Arm)   Pulse 65   Temp 98.4 F (36.9 C) (Oral)   Resp 16   LMP 01/04/2019   SpO2 100%   Visual Acuity Right Eye Distance:   Left Eye Distance:   Bilateral Distance:    Right Eye Near:   Left Eye Near:    Bilateral Near:     Physical Exam Vitals signs and nursing note reviewed.  Constitutional:      General: She is not in acute distress.    Appearance: She is well-developed.  HENT:     Head: Normocephalic and atraumatic.  Eyes:     Conjunctiva/sclera: Conjunctivae normal.  Neck:     Musculoskeletal: Neck supple.  Cardiovascular:     Rate and Rhythm: Normal rate and regular rhythm.     Heart sounds: No murmur.  Pulmonary:     Effort: Pulmonary effort is normal. No respiratory distress.     Breath sounds: Normal breath sounds.  Abdominal:     Palpations: Abdomen is soft.     Tenderness: There is no abdominal tenderness. There is no right CVA tenderness, left CVA tenderness, guarding or rebound.  Genitourinary:    General: Normal vulva.     Pubic Area: No rash.      Labia:        Right: No rash or lesion.        Left: No rash or lesion.      Vagina: Vaginal discharge present.     Cervix: Normal.     Uterus: Normal.      Adnexa: Right adnexa normal and left adnexa normal.     Comments: Small amount of white vaginal discharge. Skin:    General: Skin is warm and dry.  Neurological:     Mental Status: She is alert.      UC Treatments / Results  Labs (all labs ordered are listed, but only abnormal results are displayed) Labs Reviewed  URINE CULTURE  POCT URINALYSIS DIP (DEVICE)  CERVICOVAGINAL ANCILLARY ONLY    EKG   Radiology No results found.  Procedures  Procedures (including critical care time)  Medications Ordered in UC Medications - No data to display  Initial Impression / Assessment and Plan / UC Course  I have reviewed the triage vital signs and the nursing notes.  Pertinent labs & imaging results that were available during my care of the patient were reviewed by me and considered in my medical decision making (see chart for details).   Vaginal discharge.  Patient is currently pregnant and was treated on 04/09/2019 with Keflex, Flagyl, and clotrimazole cream.  No additional treatment given today.  Urine sent for culture and vaginal swab for gonorrhea, chlamydia, trichomonas, BV.  Discussed with patient that if her test come back positive, we will call her.  Discussed that she should follow-up with her OB/GYN next week.  Discussed that she should return here or go to the emergency department if she develops fever, chills, abdominal pain, pelvic pain, dysuria, or other concerning symptoms.     Final Clinical Impressions(s) / UC Diagnoses   Final diagnoses:  Vaginal discharge     Discharge Instructions     Your urine test and STD test are pending.  If anything comes back requiring treatment, we will call you.    Return here or go to the emergency department if you develop fever, chills, abdominal pain, pelvic pain, difficulty with urination, or other concerning symptoms.    Follow-up with your OB/GYN next week.  ED Prescriptions    None     Controlled Substance Prescriptions [place] Controlled Substance Registry consulted? Not Applicable   Mickie Bailate, Rochella Benner H, NP 04/25/19 1419

## 2019-04-25 NOTE — Discharge Instructions (Addendum)
Your urine test and STD test are pending.  If anything comes back requiring treatment, we will call you.    Return here or go to the emergency department if you develop fever, chills, abdominal pain, pelvic pain, difficulty with urination, or other concerning symptoms.    Follow-up with your OB/GYN next week.

## 2019-04-26 LAB — URINE CULTURE: Culture: <10000 — AB

## 2019-04-30 LAB — CERVICOVAGINAL ANCILLARY ONLY
Bacterial vaginitis: POSITIVE — AB
Candida vaginitis: POSITIVE — AB
Chlamydia: NEGATIVE
Neisseria Gonorrhea: NEGATIVE
Trichomonas: NEGATIVE

## 2019-05-01 ENCOUNTER — Telehealth (HOSPITAL_COMMUNITY): Payer: Self-pay | Admitting: Emergency Medicine

## 2019-05-01 NOTE — Telephone Encounter (Signed)
Disregard all previous notes, Pt positive for both BV and yeast and was recently treated for both in the same month. Reviewed chart with both Loura Halt, NP and Dr. Lanny Cramp. Per both providers, pt needs to contact her OB for treatment and further evaluation due to recurrently infections.   Attempted to reach patient. No answer at this time. Voicemail left.

## 2019-05-01 NOTE — Telephone Encounter (Signed)
Patient returned call, gave her her test results. Pt has appt with OB on Monday, 7/27. Pt needs to bring her test results to her OB and have them decide on best treatment. Pt agreeable to plan, will follow up with OB on Monday for treatment.

## 2019-08-03 ENCOUNTER — Other Ambulatory Visit: Payer: Self-pay

## 2019-08-03 ENCOUNTER — Emergency Department (HOSPITAL_BASED_OUTPATIENT_CLINIC_OR_DEPARTMENT_OTHER)
Admission: EM | Admit: 2019-08-03 | Discharge: 2019-08-03 | Disposition: A | Discharge status: Home / Self Care | Payer: Medicaid Other | Attending: Emergency Medicine | Admitting: Emergency Medicine

## 2019-08-03 ENCOUNTER — Encounter (HOSPITAL_BASED_OUTPATIENT_CLINIC_OR_DEPARTMENT_OTHER): Payer: Self-pay | Admitting: Emergency Medicine

## 2019-08-03 DIAGNOSIS — O10013 Pre-existing essential hypertension complicating pregnancy, third trimester: Secondary | ICD-10-CM | POA: Insufficient documentation

## 2019-08-03 DIAGNOSIS — O2343 Unspecified infection of urinary tract in pregnancy, third trimester: Secondary | ICD-10-CM | POA: Insufficient documentation

## 2019-08-03 DIAGNOSIS — O26893 Other specified pregnancy related conditions, third trimester: Secondary | ICD-10-CM

## 2019-08-03 DIAGNOSIS — Z79899 Other long term (current) drug therapy: Secondary | ICD-10-CM | POA: Diagnosis not present

## 2019-08-03 DIAGNOSIS — Z3A28 28 weeks gestation of pregnancy: Secondary | ICD-10-CM | POA: Insufficient documentation

## 2019-08-03 DIAGNOSIS — R300 Dysuria: Secondary | ICD-10-CM | POA: Insufficient documentation

## 2019-08-03 LAB — URINALYSIS, ROUTINE W REFLEX MICROSCOPIC
Bilirubin Urine: NEGATIVE
Glucose, UA: NEGATIVE mg/dL
Hgb urine dipstick: NEGATIVE
Ketones, ur: NEGATIVE mg/dL
Nitrite: NEGATIVE
Protein, ur: NEGATIVE mg/dL
Specific Gravity, Urine: 1.015 (ref 1.005–1.030)
pH: 7 (ref 5.0–8.0)

## 2019-08-03 LAB — URINALYSIS, MICROSCOPIC (REFLEX): RBC / HPF: NONE SEEN RBC/hpf (ref 0–5)

## 2019-08-03 NOTE — ED Triage Notes (Signed)
C/o possible bladder infection. Comes in with sick child as well.

## 2019-08-03 NOTE — Discharge Instructions (Signed)
Please read and follow all provided instructions.  Your diagnoses today include:  1. Dysuria during pregnancy in third trimester     Tests performed today include:  Urine test - no sign of infection in the bladder  Vital signs. See below for your results today.   Medications prescribed:   None  Home care instructions:  Follow any educational materials contained in this packet.  Follow-up instructions: Please follow-up with your primary care provider in 3 days if symptoms are not resolved for further evaluation of your symptoms.  Return instructions:   Please return to the Emergency Department if you experience worsening symptoms.   Return with fever, worsening pain, persistent vomiting, worsening pain in your back.   Please return if you have any other emergent concerns.  Additional Information:  Your vital signs today were: BP (!) 159/85    Pulse (!) 115    Temp 98.4 F (36.9 C) (Oral)    Resp 18    Ht 5\' 1"  (1.549 m)    Wt 74.8 kg    LMP 01/04/2019    SpO2 98%    BMI 31.18 kg/m  If your blood pressure (BP) was elevated above 135/85 this visit, please have this repeated by your doctor within one month. --------------

## 2019-08-03 NOTE — ED Triage Notes (Deleted)
Pt started having L eye reddness, swelling, and drainage that started on 10/20. Pt seen an eye dr on 10/23 and was placed on doxycycline. Eye has continued to swell and worsen. Pt unable to open L eye.

## 2019-08-03 NOTE — ED Notes (Signed)
Pt declined vitals reassessment, stated she was tired and wanted to leave.

## 2019-08-03 NOTE — ED Provider Notes (Signed)
[facility] EMERGENCY DEPARTMENT Provider Note   CSN: [id number] Arrival date & time: 08/03/19  1953     History   Chief Complaint Chief Complaint  Patient presents with  . Urinary Tract Infection    HPI Olivia Pattyanesha M [name] is a 33 y.o. female.     Patient, who is 7 months pregnant, is here with her ill child tonight.  She request to be seen for irritative urinary symptoms.  Patient reports current pregnancy and frequent UTIs.  She reports increased frequency and mild discomfort with urination.  Symptoms similar to previous UTI.  Patient also notes an odor to the urine.  She denies any vaginal bleeding or discharge.  Symptoms ongoing for a couple of days.  No fevers, back pain, vomiting.  Onset of symptoms gradual.  Course is constant.     Past Medical History:  Diagnosis Date  . Anxiety   . Bipolar 1 disorder (HCC)   . Cocaine abuse (HCC)   . Gestational diabetes   . Hypertension   . Migraine   . Ovarian cyst   . PTSD (post-traumatic stress disorder)     Patient Active Problem List   Diagnosis Date Noted  . Status post primary low transverse cesarean section 12/06/2017  . PROM (premature rupture of membranes) 12/05/2017  . Chronic hypertension during pregnancy, antepartum 11/07/2017  . Diabetes in pregnancy 07/19/2017  . Cocaine use complicating pregnancy, second trimester 06/28/2017  . Low vitamin D level 06/25/2017  . Rh negative state in antepartum period 06/25/2017  . Elevated hemoglobin A1c 06/25/2017  . Obesity affecting pregnancy in second trimester 06/20/2017  . Late prenatal care affecting pregnancy 06/20/2017  . Tobacco use affecting pregnancy, antepartum 06/20/2017  . 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 Supervision of high risk pregnancy, antepartum 12/03/2014    Past Surgical History:  Procedure Laterality Date  . CESAREAN SECTION N/A 12/06/2017   Procedure: CESAREAN SECTION;  Surgeon:  Tilda BurrowFerguson, John V, MD;  [facility];  Service: Obstetrics;  Laterality: N/A;  . WISDOM TOOTH EXTRACTION       OB History    Gravida  4   Para  1   Term  1   Preterm      AB  2   Living  1     SAB  1   TAB  1   Ectopic      Multiple  0   Live Births  1            Home Medications    Prior to Admission medications   Medication Sig Start Date End Date Taking? Authorizing Provider  acetaminophen (TYLENOL) 500 MG tablet Take 500 mg by mouth every 6 (six) hours as needed for mild pain or headache.    [provider]  calcium carbonate (TUMS - DOSED IN MG ELEMENTAL CALCIUM) 500 MG chewable tablet Chew 1 tablet by mouth 2 (two) times daily as needed for indigestion or heartburn.    [provider]  cyclobenzaprine (FLEXERIL) 5 MG tablet Take 1 tablet (5 mg total) by mouth at bedtime. 07/15/18   Elvina SidleLauenstein, Kurt, MD  Doxylamine-Pyridoxine (DICLEGIS) 10-10 MG TBEC Take 1 tablet with breakfast and lunch.  Take 2 tablets at bedtime. 06/20/17   Orvilla Cornwallenney, Rachelle A, CNM  glucose blood (ACCU-CHEK GUIDE) test strip Use as instructed 07/26/17   Levie HeritageStinson, Jacob J, DO  loratadine (CLARITIN) 10 MG tablet Take 1 tablet (10 mg total) daily by  mouth. Patient taking differently: Take 10 mg by mouth daily as needed for allergies.  08/16/17   Brock Bad, MD  methocarbamol (ROBAXIN) 500 MG tablet Take 1 tablet (500 mg total) by mouth 2 (two) times daily. 08/09/18   Dartha Lodge, PA-C  omeprazole (PRILOSEC) 20 MG capsule Take 1 capsule (20 mg total) 2 (two) times daily before a meal by mouth. 08/16/17   Brock Bad, MD  Prenat-FeAsp-Meth-FA-DHA w/o A (PRENATE PIXIE) 10-0.6-0.4-200 MG CAPS Take 1 tablet by mouth daily. 12/21/17   Brock Bad, MD  QUEtiapine (SEROQUEL) 50 MG tablet Take 1 tablet (50 mg total) by mouth at bedtime. 04/18/18   Arfeen, Phillips Grout, MD  triamterene-hydrochlorothiazide (DYAZIDE) 37.5-25 MG capsule Take 1 each (1 capsule total) by  mouth daily. 12/21/17   Brock Bad, MD  Vitamin D, Ergocalciferol, (DRISDOL) 50000 units CAPS capsule Take 1 capsule (50,000 Units total) by mouth every 7 (seven) days. 12/21/17   Brock Bad, MD    Family History Family History  Problem Relation Age of Onset  . Hypertension Mother   . Migraines Mother   . Anxiety disorder Mother   . Depression Mother   . Seizures Mother   . Hypertension Father   . Cancer Father        prostate  . Diabetes Father   . Anxiety disorder Father   . Bipolar disorder Father   . Depression Father   . OCD Father   . Heart disease Maternal Grandmother   . Heart attack Maternal Grandmother 78  . ADD / ADHD Brother   . Schizophrenia Maternal Uncle     Social History Social History   Tobacco Use  . Smoking status: Current Every Day Smoker    Packs/day: 1.00    Types: Cigarettes  . Smokeless tobacco: Never Used  . Tobacco comment: 5 cigarettes per day (pregnant)  Substance Use Topics  . Alcohol use: Not Currently    Comment: socially  . Drug use: Yes    Types: Cocaine    Comment: not used in a year     Allergies   Ibuprofen   Review of Systems Review of Systems  Constitutional: Negative for fever.  HENT: Negative for rhinorrhea and sore throat.   Eyes: Negative for redness.  Respiratory: Negative for cough.   Cardiovascular: Negative for chest pain.  Gastrointestinal: Negative for abdominal pain, diarrhea, nausea and vomiting.  Genitourinary: Positive for dysuria, frequency and urgency. Negative for flank pain, hematuria, vaginal bleeding and vaginal discharge.  Musculoskeletal: Negative for myalgias.  Skin: Negative for rash.  Neurological: Negative for headaches.     Physical Exam Updated Vital Signs BP (!) 159/85   Pulse (!) 115   Temp 98.7 F (37.1 C)   Resp 18   Ht 5\' 1"  (1.549 m)   Wt 74.8 kg   LMP 01/04/2019   SpO2 98%   BMI 31.18 kg/m   Physical Exam Vitals signs and nursing note reviewed.   Constitutional:      Appearance: She is well-developed.  HENT:     Head: Normocephalic and atraumatic.  Eyes:     General:        Right eye: No discharge.        Left eye: No discharge.     Conjunctiva/sclera: Conjunctivae normal.  Neck:     Musculoskeletal: Normal range of motion and neck supple.  Cardiovascular:     Rate and Rhythm: Normal rate.  Pulmonary:  Effort: Pulmonary effort is normal.  Abdominal:     Palpations: Abdomen is soft.     Tenderness: There is no abdominal tenderness.  Skin:    General: Skin is warm and dry.  Neurological:     Mental Status: She is alert.      ED Treatments / Results  Labs (all labs ordered are listed, but only abnormal results are displayed) Labs Reviewed  URINALYSIS, ROUTINE W REFLEX MICROSCOPIC - Abnormal; Notable for the following components:      Result Value   Leukocytes,Ua TRACE (*)    All other components within normal limits  URINALYSIS, MICROSCOPIC (REFLEX) - Abnormal; Notable for the following components:   Bacteria, UA RARE (*)    All other components within normal limits    EKG None  Radiology No results found.  Procedures Procedures (including critical care time)  Medications Ordered in ED Medications - No data to display   Initial Impression / Assessment and Plan / ED Course  I have reviewed the triage vital signs and the nursing notes.  Pertinent labs & imaging results that were available during my care of the patient were reviewed by me and considered in my medical decision making (see chart for details).        Patient seen and examined. Work-up initiated.   Vital signs reviewed and are as follows: BP (!) 159/85   Pulse (!) 115   Temp 98.7 F (37.1 C)   Resp 18   Ht 5\' 1"  (1.549 m)   Wt 74.8 kg   LMP 01/04/2019   SpO2 98%   BMI 31.18 kg/m   UA without signs of infection.  Patient seems reassured.  She will follow-up with her OB/GYN.  Suspect increased frequency from pregnancy.  No  indication for admission at this point.  She denies other vaginal symptoms.  Patient is in a hurry to go home and she will be discharged home at this time.  Encouraged to follow-up if symptoms worsen or change.  Final Clinical Impressions(s) / ED Diagnoses   Final diagnoses:  Dysuria during pregnancy in third trimester   UA negative.  No vaginal complaints.  No concerns for contractions or early labor.  Patient to monitor symptoms at home.  ED Discharge Orders    None       Carlisle Cater, Hershal Coria 08/03/19 2232    Margette Fast, MD 08/04/19 1014

## 2019-10-02 ENCOUNTER — Other Ambulatory Visit: Payer: Self-pay

## 2019-10-02 ENCOUNTER — Inpatient Hospital Stay (HOSPITAL_COMMUNITY): Payer: Medicaid Other | Admitting: Anesthesiology

## 2019-10-02 ENCOUNTER — Inpatient Hospital Stay (HOSPITAL_COMMUNITY)
Admission: AD | Admit: 2019-10-02 | Discharge: 2019-10-05 | DRG: 784 | Disposition: A | Discharge status: Home / Self Care | Payer: Medicaid Other | Attending: Obstetrics and Gynecology | Admitting: Obstetrics and Gynecology

## 2019-10-02 ENCOUNTER — Encounter (HOSPITAL_COMMUNITY)
Admission: AD | Disposition: A | Discharge status: Home / Self Care | Payer: Self-pay | Source: Home / Self Care | Attending: Obstetrics and Gynecology

## 2019-10-02 ENCOUNTER — Inpatient Hospital Stay (HOSPITAL_COMMUNITY): Payer: Medicaid Other

## 2019-10-02 ENCOUNTER — Encounter (HOSPITAL_COMMUNITY): Payer: Self-pay | Admitting: Obstetrics and Gynecology

## 2019-10-02 DIAGNOSIS — O4100X0 Oligohydramnios, unspecified trimester, not applicable or unspecified: Secondary | ICD-10-CM

## 2019-10-02 DIAGNOSIS — Z302 Encounter for sterilization: Secondary | ICD-10-CM | POA: Diagnosis not present

## 2019-10-02 DIAGNOSIS — F17210 Nicotine dependence, cigarettes, uncomplicated: Secondary | ICD-10-CM | POA: Diagnosis present

## 2019-10-02 DIAGNOSIS — O9902 Anemia complicating childbirth: Secondary | ICD-10-CM | POA: Diagnosis present

## 2019-10-02 DIAGNOSIS — O99334 Smoking (tobacco) complicating childbirth: Secondary | ICD-10-CM | POA: Diagnosis present

## 2019-10-02 DIAGNOSIS — O4103X0 Oligohydramnios, third trimester, not applicable or unspecified: Secondary | ICD-10-CM | POA: Diagnosis present

## 2019-10-02 DIAGNOSIS — Z3A38 38 weeks gestation of pregnancy: Secondary | ICD-10-CM

## 2019-10-02 DIAGNOSIS — O0933 Supervision of pregnancy with insufficient antenatal care, third trimester: Secondary | ICD-10-CM

## 2019-10-02 DIAGNOSIS — F1410 Cocaine abuse, uncomplicated: Secondary | ICD-10-CM | POA: Diagnosis present

## 2019-10-02 DIAGNOSIS — D649 Anemia, unspecified: Secondary | ICD-10-CM | POA: Diagnosis present

## 2019-10-02 DIAGNOSIS — O34211 Maternal care for low transverse scar from previous cesarean delivery: Principal | ICD-10-CM | POA: Diagnosis present

## 2019-10-02 DIAGNOSIS — Z20828 Contact with and (suspected) exposure to other viral communicable diseases: Secondary | ICD-10-CM | POA: Diagnosis present

## 2019-10-02 DIAGNOSIS — O1410 Severe pre-eclampsia, unspecified trimester: Secondary | ICD-10-CM | POA: Diagnosis present

## 2019-10-02 DIAGNOSIS — O99214 Obesity complicating childbirth: Secondary | ICD-10-CM | POA: Diagnosis present

## 2019-10-02 DIAGNOSIS — O4693 Antepartum hemorrhage, unspecified, third trimester: Secondary | ICD-10-CM

## 2019-10-02 DIAGNOSIS — E669 Obesity, unspecified: Secondary | ICD-10-CM | POA: Diagnosis present

## 2019-10-02 DIAGNOSIS — Z98891 History of uterine scar from previous surgery: Secondary | ICD-10-CM

## 2019-10-02 DIAGNOSIS — O99323 Drug use complicating pregnancy, third trimester: Secondary | ICD-10-CM | POA: Diagnosis not present

## 2019-10-02 DIAGNOSIS — O99324 Drug use complicating childbirth: Secondary | ICD-10-CM | POA: Diagnosis present

## 2019-10-02 DIAGNOSIS — O1002 Pre-existing essential hypertension complicating childbirth: Secondary | ICD-10-CM | POA: Diagnosis present

## 2019-10-02 DIAGNOSIS — O119 Pre-existing hypertension with pre-eclampsia, unspecified trimester: Secondary | ICD-10-CM

## 2019-10-02 DIAGNOSIS — S3723XA Laceration of bladder, initial encounter: Secondary | ICD-10-CM

## 2019-10-02 DIAGNOSIS — O114 Pre-existing hypertension with pre-eclampsia, complicating childbirth: Secondary | ICD-10-CM | POA: Diagnosis present

## 2019-10-02 DIAGNOSIS — Z9851 Tubal ligation status: Secondary | ICD-10-CM

## 2019-10-02 DIAGNOSIS — O1414 Severe pre-eclampsia complicating childbirth: Secondary | ICD-10-CM

## 2019-10-02 DIAGNOSIS — F1411 Cocaine abuse, in remission: Secondary | ICD-10-CM

## 2019-10-02 DIAGNOSIS — N939 Abnormal uterine and vaginal bleeding, unspecified: Secondary | ICD-10-CM

## 2019-10-02 LAB — GC/CHLAMYDIA PROBE AMP (~~LOC~~) NOT AT ARMC
Chlamydia: NEGATIVE
Comment: NEGATIVE
Comment: NORMAL
Neisseria Gonorrhea: NEGATIVE

## 2019-10-02 LAB — RAPID URINE DRUG SCREEN, HOSP PERFORMED
Amphetamines: NOT DETECTED
Barbiturates: NOT DETECTED
Benzodiazepines: NOT DETECTED
Cocaine: NOT DETECTED
Opiates: POSITIVE — AB
Tetrahydrocannabinol: NOT DETECTED

## 2019-10-02 LAB — COMPREHENSIVE METABOLIC PANEL
ALT: 16 U/L (ref 0–44)
AST: 23 U/L (ref 15–41)
Albumin: 2.9 g/dL — ABNORMAL LOW (ref 3.5–5.0)
Alkaline Phosphatase: 169 U/L — ABNORMAL HIGH (ref 38–126)
Anion gap: 11 (ref 5–15)
BUN: 8 mg/dL (ref 6–20)
CO2: 22 mmol/L (ref 22–32)
Calcium: 9 mg/dL (ref 8.9–10.3)
Chloride: 104 mmol/L (ref 98–111)
Creatinine, Ser: 0.69 mg/dL (ref 0.44–1.00)
GFR calc Af Amer: >60 mL/min (ref >60)
GFR calc non Af Amer: >60 mL/min (ref >60)
Glucose, Bld: 91 mg/dL (ref 70–99)
Potassium: 3.9 mmol/L (ref 3.5–5.1)
Sodium: 137 mmol/L (ref 135–145)
Total Bilirubin: 0.3 mg/dL (ref 0.3–1.2)
Total Protein: 6.2 g/dL — ABNORMAL LOW (ref 6.5–8.1)

## 2019-10-02 LAB — PROTEIN / CREATININE RATIO, URINE
Creatinine, Urine: 117.17 mg/dL
Protein Creatinine Ratio: 0.74 mg/mg{Cre} — ABNORMAL HIGH (ref 0.00–0.15)
Total Protein, Urine: 87 mg/dL

## 2019-10-02 LAB — HEMOGLOBIN A1C
Hgb A1c MFr Bld: 5.3 % (ref 4.8–5.6)
Mean Plasma Glucose: 105.41 mg/dL

## 2019-10-02 LAB — WET PREP, GENITAL
Sperm: NONE SEEN
Trich, Wet Prep: NONE SEEN
Yeast Wet Prep HPF POC: NONE SEEN

## 2019-10-02 LAB — TYPE AND SCREEN
ABO/RH(D): O NEG
Antibody Screen: NEGATIVE

## 2019-10-02 LAB — CBC
HCT: 33 % — ABNORMAL LOW (ref 36.0–46.0)
Hemoglobin: 10.7 g/dL — ABNORMAL LOW (ref 12.0–15.0)
MCH: 28.6 pg (ref 26.0–34.0)
MCHC: 32.4 g/dL (ref 30.0–36.0)
MCV: 88.2 fL (ref 80.0–100.0)
Platelets: 247 10*3/uL (ref 150–400)
RBC: 3.74 MIL/uL — ABNORMAL LOW (ref 3.87–5.11)
RDW: 11.9 % (ref 11.5–15.5)
WBC: 11.8 10*3/uL — ABNORMAL HIGH (ref 4.0–10.5)
nRBC: 0 % (ref 0.0–0.2)

## 2019-10-02 LAB — HEPATITIS B SURFACE ANTIGEN: Hepatitis B Surface Ag: NONREACTIVE

## 2019-10-02 LAB — RESPIRATORY PANEL BY RT PCR (FLU A&B, COVID)
Influenza A by PCR: NEGATIVE
Influenza B by PCR: NEGATIVE
SARS Coronavirus 2 by RT PCR: NEGATIVE

## 2019-10-02 LAB — RPR: RPR Ser Ql: NONREACTIVE

## 2019-10-02 LAB — POCT FERN TEST: POCT Fern Test: NEGATIVE

## 2019-10-02 LAB — HEPATITIS C ANTIBODY: HCV Ab: NONREACTIVE

## 2019-10-02 LAB — HIV ANTIBODY (ROUTINE TESTING W REFLEX): HIV Screen 4th Generation wRfx: NONREACTIVE

## 2019-10-02 SURGERY — Surgical Case
Anesthesia: Spinal

## 2019-10-02 MED ORDER — NIFEDIPINE 10 MG PO CAPS
10.0000 mg | ORAL_CAPSULE | Freq: Once | ORAL | Status: AC
  Administered 2019-10-04: 08:00:00 10 mg via ORAL
  Filled 2019-10-02 (×2): qty 1

## 2019-10-02 MED ORDER — OXYTOCIN 10 UNIT/ML IJ SOLN
INTRAMUSCULAR | Status: DC | PRN
  Administered 2019-10-02: 30 [IU]

## 2019-10-02 MED ORDER — TETANUS-DIPHTH-ACELL PERTUSSIS 5-2.5-18.5 LF-MCG/0.5 IM SUSP: 0.5000 mL | Freq: Once | INTRAMUSCULAR | Status: DC

## 2019-10-02 MED ORDER — DIPHENHYDRAMINE HCL 25 MG PO CAPS: 25.0000 mg | ORAL_CAPSULE | Freq: Four times a day (QID) | ORAL | Status: DC | PRN

## 2019-10-02 MED ORDER — HYDROMORPHONE HCL 1 MG/ML IJ SOLN
1.0000 mg | Freq: Once | INTRAMUSCULAR | Status: AC
  Administered 2019-10-02: 1 mg via INTRAVENOUS
  Filled 2019-10-02: qty 1

## 2019-10-02 MED ORDER — DEXAMETHASONE SODIUM PHOSPHATE 10 MG/ML IJ SOLN
INTRAMUSCULAR | Status: AC
  Filled 2019-10-02: qty 1

## 2019-10-02 MED ORDER — OXYTOCIN 40 UNITS IN NORMAL SALINE INFUSION - SIMPLE MED
INTRAVENOUS | Status: AC
  Filled 2019-10-02: qty 1000

## 2019-10-02 MED ORDER — SCOPOLAMINE 1 MG/3DAYS TD PT72
1.0000 | MEDICATED_PATCH | Freq: Once | TRANSDERMAL | Status: AC
  Administered 2019-10-02: 1.5 mg via TRANSDERMAL

## 2019-10-02 MED ORDER — PROMETHAZINE HCL 25 MG/ML IJ SOLN
25.0000 mg | Freq: Once | INTRAMUSCULAR | Status: AC
  Administered 2019-10-02: 25 mg via INTRAVENOUS
  Filled 2019-10-02: qty 1

## 2019-10-02 MED ORDER — SENNOSIDES-DOCUSATE SODIUM 8.6-50 MG PO TABS
2.0000 | ORAL_TABLET | ORAL | Status: DC
  Administered 2019-10-02 – 2019-10-05 (×3): 2 via ORAL
  Filled 2019-10-02 (×3): qty 2

## 2019-10-02 MED ORDER — SIMETHICONE 80 MG PO CHEW
80.0000 mg | CHEWABLE_TABLET | Freq: Three times a day (TID) | ORAL | Status: DC
  Administered 2019-10-02 – 2019-10-05 (×10): 80 mg via ORAL
  Filled 2019-10-02 (×10): qty 1

## 2019-10-02 MED ORDER — MENTHOL 3 MG MT LOZG: 1.0000 | LOZENGE | OROMUCOSAL | Status: DC | PRN

## 2019-10-02 MED ORDER — FENTANYL CITRATE (PF) 100 MCG/2ML IJ SOLN
50.0000 ug | INTRAMUSCULAR | Status: DC | PRN
  Administered 2019-10-02: 50 ug via INTRAVENOUS
  Filled 2019-10-02: qty 2

## 2019-10-02 MED ORDER — SIMETHICONE 80 MG PO CHEW
80.0000 mg | CHEWABLE_TABLET | ORAL | Status: DC | PRN
  Administered 2019-10-03: 80 mg via ORAL
  Filled 2019-10-02: qty 1

## 2019-10-02 MED ORDER — ZOLPIDEM TARTRATE 5 MG PO TABS: 5.0000 mg | ORAL_TABLET | Freq: Every evening | ORAL | Status: DC | PRN

## 2019-10-02 MED ORDER — OXYCODONE HCL 5 MG PO TABS
5.0000 mg | ORAL_TABLET | Freq: Four times a day (QID) | ORAL | Status: DC | PRN
  Administered 2019-10-02 – 2019-10-03 (×5): 10 mg via ORAL
  Administered 2019-10-04: 5 mg via ORAL
  Administered 2019-10-04 – 2019-10-05 (×3): 10 mg via ORAL
  Filled 2019-10-02: qty 1
  Filled 2019-10-02 (×9): qty 2

## 2019-10-02 MED ORDER — ENOXAPARIN SODIUM 40 MG/0.4ML ~~LOC~~ SOLN
40.0000 mg | SUBCUTANEOUS | Status: DC
  Administered 2019-10-03 – 2019-10-05 (×3): 40 mg via SUBCUTANEOUS
  Filled 2019-10-02 (×3): qty 0.4

## 2019-10-02 MED ORDER — FENTANYL CITRATE (PF) 100 MCG/2ML IJ SOLN: 100.0000 ug | INTRAMUSCULAR | Status: DC | PRN

## 2019-10-02 MED ORDER — RHO D IMMUNE GLOBULIN 1500 UNIT/2ML IJ SOSY
300.0000 ug | PREFILLED_SYRINGE | Freq: Once | INTRAMUSCULAR | Status: AC
  Administered 2019-10-03: 300 ug via INTRAVENOUS
  Filled 2019-10-02: qty 2

## 2019-10-02 MED ORDER — PHENYLEPHRINE HCL-NACL 20-0.9 MG/250ML-% IV SOLN
INTRAVENOUS | Status: DC | PRN
  Administered 2019-10-02: 60 ug/min via INTRAVENOUS

## 2019-10-02 MED ORDER — WITCH HAZEL-GLYCERIN EX PADS: 1.0000 "application " | MEDICATED_PAD | CUTANEOUS | Status: DC | PRN

## 2019-10-02 MED ORDER — MORPHINE SULFATE (PF) 0.5 MG/ML IJ SOLN
INTRAMUSCULAR | Status: AC
  Filled 2019-10-02: qty 10

## 2019-10-02 MED ORDER — SOD CITRATE-CITRIC ACID 500-334 MG/5ML PO SOLN
30.0000 mL | Freq: Once | ORAL | Status: AC
  Administered 2019-10-02: 30 mL via ORAL

## 2019-10-02 MED ORDER — FLAVOXATE HCL 100 MG PO TABS
100.0000 mg | ORAL_TABLET | Freq: Three times a day (TID) | ORAL | Status: DC
  Administered 2019-10-02 – 2019-10-05 (×9): 100 mg via ORAL
  Filled 2019-10-02 (×11): qty 1

## 2019-10-02 MED ORDER — FENTANYL CITRATE (PF) 100 MCG/2ML IJ SOLN
INTRAMUSCULAR | Status: AC
  Filled 2019-10-02: qty 2

## 2019-10-02 MED ORDER — ONDANSETRON HCL 4 MG/2ML IJ SOLN
INTRAMUSCULAR | Status: DC | PRN
  Administered 2019-10-02: 4 mg via INTRAVENOUS

## 2019-10-02 MED ORDER — PHENYLEPHRINE HCL-NACL 20-0.9 MG/250ML-% IV SOLN
INTRAVENOUS | Status: AC
  Filled 2019-10-02: qty 250

## 2019-10-02 MED ORDER — HYDRALAZINE HCL 20 MG/ML IJ SOLN: 10.0000 mg | INTRAMUSCULAR | Status: DC | PRN

## 2019-10-02 MED ORDER — MAGNESIUM SULFATE 40 GM/1000ML IV SOLN
2.0000 g/h | INTRAVENOUS | Status: DC
  Filled 2019-10-02: qty 1000

## 2019-10-02 MED ORDER — BUTORPHANOL TARTRATE 1 MG/ML IJ SOLN: 1.0000 mg | Freq: Once | INTRAMUSCULAR | Status: DC

## 2019-10-02 MED ORDER — SODIUM CHLORIDE 0.9 % IV SOLN: INTRAVENOUS | Status: DC | PRN

## 2019-10-02 MED ORDER — FENTANYL CITRATE (PF) 100 MCG/2ML IJ SOLN
INTRAMUSCULAR | Status: DC | PRN
  Administered 2019-10-02: 15 ug via INTRATHECAL

## 2019-10-02 MED ORDER — ACETAMINOPHEN 500 MG PO TABS
1000.0000 mg | ORAL_TABLET | Freq: Four times a day (QID) | ORAL | Status: DC | PRN
  Administered 2019-10-03 (×2): 1000 mg via ORAL
  Filled 2019-10-02 (×3): qty 2

## 2019-10-02 MED ORDER — LACTATED RINGERS IV SOLN: INTRAVENOUS | Status: DC

## 2019-10-02 MED ORDER — LACTATED RINGERS IV SOLN: INTRAVENOUS | Status: DC | PRN

## 2019-10-02 MED ORDER — FENTANYL CITRATE (PF) 100 MCG/2ML IJ SOLN
25.0000 ug | INTRAMUSCULAR | Status: DC | PRN
  Administered 2019-10-02: 25 ug via INTRAVENOUS
  Administered 2019-10-02 (×2): 50 ug via INTRAVENOUS
  Administered 2019-10-02: 25 ug via INTRAVENOUS

## 2019-10-02 MED ORDER — LABETALOL HCL 5 MG/ML IV SOLN
80.0000 mg | INTRAVENOUS | Status: DC | PRN
  Administered 2019-10-02: 80 mg via INTRAVENOUS
  Filled 2019-10-02: qty 16

## 2019-10-02 MED ORDER — ACETAMINOPHEN 10 MG/ML IV SOLN
INTRAVENOUS | Status: AC
  Filled 2019-10-02: qty 100

## 2019-10-02 MED ORDER — CEFAZOLIN SODIUM-DEXTROSE 2-3 GM-%(50ML) IV SOLR
INTRAVENOUS | Status: DC | PRN
  Administered 2019-10-02: 2 g via INTRAVENOUS

## 2019-10-02 MED ORDER — HYDROMORPHONE HCL 2 MG/ML IJ SOLN
2.0000 mg | INTRAMUSCULAR | Status: DC | PRN
  Administered 2019-10-02 – 2019-10-05 (×12): 2 mg via INTRAVENOUS
  Filled 2019-10-02 (×15): qty 1

## 2019-10-02 MED ORDER — CEPHALEXIN 500 MG PO CAPS
500.0000 mg | ORAL_CAPSULE | Freq: Three times a day (TID) | ORAL | Status: DC
  Administered 2019-10-02 – 2019-10-05 (×10): 500 mg via ORAL
  Filled 2019-10-02 (×10): qty 1

## 2019-10-02 MED ORDER — LABETALOL HCL 5 MG/ML IV SOLN
20.0000 mg | INTRAVENOUS | Status: DC | PRN
  Administered 2019-10-02: 20 mg via INTRAVENOUS
  Filled 2019-10-02 (×2): qty 4

## 2019-10-02 MED ORDER — SOD CITRATE-CITRIC ACID 500-334 MG/5ML PO SOLN
ORAL | Status: AC
  Filled 2019-10-02: qty 30

## 2019-10-02 MED ORDER — COCONUT OIL OIL: 1.0000 "application " | TOPICAL_OIL | Status: DC | PRN

## 2019-10-02 MED ORDER — MAGNESIUM SULFATE BOLUS VIA INFUSION
6.0000 g | Freq: Once | INTRAVENOUS | Status: AC
  Administered 2019-10-02: 6 g via INTRAVENOUS
  Filled 2019-10-02: qty 1000

## 2019-10-02 MED ORDER — MORPHINE SULFATE (PF) 0.5 MG/ML IJ SOLN
INTRAMUSCULAR | Status: DC | PRN
  Administered 2019-10-02: .15 mg via INTRATHECAL

## 2019-10-02 MED ORDER — BUPIVACAINE IN DEXTROSE 0.75-8.25 % IT SOLN
INTRATHECAL | Status: DC | PRN
  Administered 2019-10-02: 1.5 mL via INTRATHECAL

## 2019-10-02 MED ORDER — CEFAZOLIN SODIUM-DEXTROSE 2-4 GM/100ML-% IV SOLN
INTRAVENOUS | Status: AC
  Filled 2019-10-02: qty 100

## 2019-10-02 MED ORDER — MAGNESIUM SULFATE 40 GM/1000ML IV SOLN
2.0000 g/h | INTRAVENOUS | Status: DC
  Administered 2019-10-03: 02:00:00 2 g/h via INTRAVENOUS
  Filled 2019-10-02: qty 1000

## 2019-10-02 MED ORDER — LABETALOL HCL 5 MG/ML IV SOLN: 80.0000 mg | INTRAVENOUS | Status: DC | PRN

## 2019-10-02 MED ORDER — LABETALOL HCL 5 MG/ML IV SOLN: 40.0000 mg | INTRAVENOUS | Status: DC | PRN

## 2019-10-02 MED ORDER — OXYTOCIN 40 UNITS IN NORMAL SALINE INFUSION - SIMPLE MED: 2.5000 [IU]/h | INTRAVENOUS | Status: AC

## 2019-10-02 MED ORDER — ACETAMINOPHEN 10 MG/ML IV SOLN
1000.0000 mg | Freq: Once | INTRAVENOUS | Status: AC
  Administered 2019-10-02: 1000 mg via INTRAVENOUS

## 2019-10-02 MED ORDER — DEXAMETHASONE SODIUM PHOSPHATE 4 MG/ML IJ SOLN
INTRAMUSCULAR | Status: DC | PRN
  Administered 2019-10-02: 8 mg via INTRAVENOUS

## 2019-10-02 MED ORDER — HYDRALAZINE HCL 50 MG PO TABS: 25.0000 mg | ORAL_TABLET | Freq: Four times a day (QID) | ORAL | Status: DC | PRN

## 2019-10-02 MED ORDER — PHENYLEPHRINE HCL (PRESSORS) 10 MG/ML IV SOLN: INTRAVENOUS | Status: DC | PRN

## 2019-10-02 MED ORDER — DIBUCAINE (PERIANAL) 1 % EX OINT: 1.0000 "application " | TOPICAL_OINTMENT | CUTANEOUS | Status: DC | PRN

## 2019-10-02 MED ORDER — BUTORPHANOL TARTRATE 1 MG/ML IJ SOLN
1.0000 mg | Freq: Once | INTRAMUSCULAR | Status: AC
  Administered 2019-10-02: 1 mg via INTRAVENOUS
  Filled 2019-10-02: qty 1

## 2019-10-02 MED ORDER — LACTATED RINGERS IV BOLUS
500.0000 mL | Freq: Once | INTRAVENOUS | Status: AC
  Administered 2019-10-02: 500 mL via INTRAVENOUS

## 2019-10-02 MED ORDER — TERBUTALINE SULFATE 1 MG/ML IJ SOLN: 0.2500 mg | Freq: Once | INTRAMUSCULAR | Status: DC

## 2019-10-02 MED ORDER — SCOPOLAMINE 1 MG/3DAYS TD PT72
MEDICATED_PATCH | TRANSDERMAL | Status: AC
  Filled 2019-10-02: qty 1

## 2019-10-02 MED ORDER — LABETALOL HCL 5 MG/ML IV SOLN
40.0000 mg | INTRAVENOUS | Status: DC | PRN
  Administered 2019-10-02: 06:00:00 40 mg via INTRAVENOUS
  Filled 2019-10-02: qty 8

## 2019-10-02 MED ORDER — ONDANSETRON HCL 4 MG/2ML IJ SOLN
INTRAMUSCULAR | Status: AC
  Filled 2019-10-02: qty 2

## 2019-10-02 MED ORDER — LABETALOL HCL 5 MG/ML IV SOLN
20.0000 mg | INTRAVENOUS | Status: DC | PRN
  Administered 2019-10-03: 20 mg via INTRAVENOUS

## 2019-10-02 MED ORDER — SIMETHICONE 80 MG PO CHEW
80.0000 mg | CHEWABLE_TABLET | ORAL | Status: DC
  Administered 2019-10-02 – 2019-10-05 (×3): 80 mg via ORAL
  Filled 2019-10-02 (×3): qty 1

## 2019-10-02 MED ORDER — PRENATAL MULTIVITAMIN CH
1.0000 | ORAL_TABLET | Freq: Every day | ORAL | Status: DC
  Administered 2019-10-03 – 2019-10-05 (×3): 1 via ORAL
  Filled 2019-10-02 (×3): qty 1

## 2019-10-02 SURGICAL SUPPLY — 39 items
APL SKNCLS STERI-STRIP NONHPOA (GAUZE/BANDAGES/DRESSINGS) ×1
BENZOIN TINCTURE PRP APPL 2/3 (GAUZE/BANDAGES/DRESSINGS) ×2 IMPLANT
CHLORAPREP W/TINT 26ML (MISCELLANEOUS) ×3 IMPLANT
CLAMP CORD UMBIL (MISCELLANEOUS) IMPLANT
CLIP FILSHIE TUBAL LIGA STRL (Clip) ×2 IMPLANT
CLOSURE WOUND 1/2 X4 (GAUZE/BANDAGES/DRESSINGS) ×1
CLOTH BEACON ORANGE TIMEOUT ST (SAFETY) ×3 IMPLANT
DRSG OPSITE POSTOP 4X10 (GAUZE/BANDAGES/DRESSINGS) ×3 IMPLANT
ELECT REM PT RETURN 9FT ADLT (ELECTROSURGICAL) ×3
ELECTRODE REM PT RTRN 9FT ADLT (ELECTROSURGICAL) ×1 IMPLANT
EXTRACTOR VACUUM KIWI (MISCELLANEOUS) IMPLANT
GLOVE BIOGEL PI IND STRL 7.0 (GLOVE) ×1 IMPLANT
GLOVE BIOGEL PI IND STRL 9 (GLOVE) ×1 IMPLANT
GLOVE BIOGEL PI INDICATOR 7.0 (GLOVE) ×2
GLOVE BIOGEL PI INDICATOR 9 (GLOVE) ×2
GLOVE SS PI 9.0 STRL (GLOVE) ×3 IMPLANT
GOWN STRL REUS W/TWL 2XL LVL3 (GOWN DISPOSABLE) ×3 IMPLANT
GOWN STRL REUS W/TWL LRG LVL3 (GOWN DISPOSABLE) ×3 IMPLANT
NDL HYPO 25X5/8 SAFETYGLIDE (NEEDLE) IMPLANT
NEEDLE HYPO 25X5/8 SAFETYGLIDE (NEEDLE) IMPLANT
NS IRRIG 1000ML POUR BTL (IV SOLUTION) ×3 IMPLANT
PACK C SECTION WH (CUSTOM PROCEDURE TRAY) ×3 IMPLANT
PAD OB MATERNITY 4.3X12.25 (PERSONAL CARE ITEMS) ×3 IMPLANT
PENCIL SMOKE EVAC W/HOLSTER (ELECTROSURGICAL) ×3 IMPLANT
RTRCTR C-SECT PINK 25CM LRG (MISCELLANEOUS) IMPLANT
RTRCTR C-SECT PINK 34CM XLRG (MISCELLANEOUS) IMPLANT
STRIP CLOSURE SKIN 1/2X4 (GAUZE/BANDAGES/DRESSINGS) ×1 IMPLANT
SUT CHROMIC 2 0 CT 1 (SUTURE) ×6 IMPLANT
SUT MNCRL 0 VIOLET CTX 36 (SUTURE) ×2 IMPLANT
SUT MONOCRYL 0 CTX 36 (SUTURE) ×6
SUT VIC AB 0 CT1 27 (SUTURE) ×3
SUT VIC AB 0 CT1 27XBRD ANBCTR (SUTURE) ×1 IMPLANT
SUT VIC AB 2-0 CT1 27 (SUTURE) ×3
SUT VIC AB 2-0 CT1 TAPERPNT 27 (SUTURE) ×1 IMPLANT
SUT VIC AB 4-0 KS 27 (SUTURE) ×3 IMPLANT
SYR BULB IRRIGATION 50ML (SYRINGE) IMPLANT
TOWEL OR 17X24 6PK STRL BLUE (TOWEL DISPOSABLE) ×3 IMPLANT
TRAY FOLEY W/BAG SLVR 14FR LF (SET/KITS/TRAYS/PACK) ×3 IMPLANT
WATER STERILE IRR 1000ML POUR (IV SOLUTION) ×3 IMPLANT

## 2019-10-02 NOTE — Progress Notes (Signed)
Report arrived and report received from EMS.

## 2019-10-02 NOTE — Anesthesia Postprocedure Evaluation (Signed)
Anesthesia Post Note  Patient: Olivia [name]  Procedure(s) Performed: CESAREAN SECTION (N/A )     Patient location during evaluation: PACU Anesthesia Type: Spinal Level of consciousness: oriented and awake and alert Pain management: pain level controlled Vital Signs Assessment: post-procedure vital signs reviewed and stable Respiratory status: spontaneous breathing, respiratory function stable and nonlabored ventilation Cardiovascular status: blood pressure returned to baseline and stable Postop Assessment: no headache, no backache, no apparent nausea or vomiting, spinal receding and patient able to bend at knees Anesthetic complications: no    Last Vitals:  Vitals:   10/02/19 1030 10/02/19 1036  BP: 114/77   Pulse: 61 61  Resp: 19 (!) 23  Temp:  (!) 36.3 C  SpO2: 100% 100%    Last Pain:  Vitals:   10/02/19 1036  TempSrc: Oral  PainSc:    Pain Goal: Patients Stated Pain Goal: 0 (10/02/19 0700)  LLE Motor Response: Purposeful movement (10/02/19 1030) LLE Sensation: Tingling (10/02/19 1030) RLE Motor Response: Purposeful movement (10/02/19 1030) RLE Sensation: Tingling (10/02/19 1030)     Epidural/Spinal Function Cutaneous sensation: Tingles (10/02/19 1030), Patient able to flex knees: Yes (10/02/19 1030), Patient able to lift hips off bed: No (10/02/19 1030), Back pain beyond tenderness at insertion site: No (10/02/19 1030), Progressively worsening motor and/or sensory loss: No (10/02/19 1030), Bowel and/or bladder incontinence post epidural: No (10/02/19 1030)  Christol Thetford A.

## 2019-10-02 NOTE — Anesthesia Preprocedure Evaluation (Addendum)
Anesthesia Evaluation  Patient identified by MRN, date of birth, ID band Patient awake    Reviewed: Allergy & Precautions, NPO status , Patient's Chart, lab work & pertinent test results, reviewed documented beta blocker date and time   Airway Mallampati: III  TM Distance: >3 FB Neck ROM: Full    Dental no notable dental hx.    Pulmonary Current Smoker,    Pulmonary exam normal breath sounds clear to auscultation       Cardiovascular hypertension, Pt. on medications Normal cardiovascular exam Rhythm:Regular Rate:Normal     Neuro/Psych  Headaches, PSYCHIATRIC DISORDERS Anxiety Depression Bipolar Disorder PTSD   GI/Hepatic GERD  Medicated and Controlled,(+)     substance abuse  cocaine use,   Endo/Other  diabetes, Well Controlled, GestationalObesity  Renal/GU   negative genitourinary   Musculoskeletal negative musculoskeletal ROS (+)   Abdominal (+) + obese,   Peds  Hematology  (+) anemia ,   Anesthesia Other Findings   Reproductive/Obstetrics (+) Pregnancy Limited PNC Previous C/Section In active labor                             Anesthesia Physical Anesthesia Plan  ASA: III and emergent  Anesthesia Plan: Spinal   Post-op Pain Management:    Induction:   PONV Risk Score and Plan: 3 and Ondansetron, Treatment may vary due to age or medical condition, Dexamethasone and Scopolamine patch - Pre-op  Airway Management Planned: Natural Airway  Additional Equipment:   Intra-op Plan:   Post-operative Plan:   Informed Consent: I have reviewed the patients History and Physical, chart, labs and discussed the procedure including the risks, benefits and alternatives for the proposed anesthesia with the patient or authorized representative who has indicated his/her understanding and acceptance.     Dental advisory given  Plan Discussed with: CRNA and Surgeon  Anesthesia Plan  Comments:         Anesthesia Quick Evaluation

## 2019-10-02 NOTE — Progress Notes (Signed)
Upon evening assessment pt. Was noted to be very unengaged with RN. She did not want a fundal assessment. I educated patient as to why they are important and what I am checking for. She then consented. However during assessment she reached for my hand and yelled to stop which I did. Without deep palpation I could feel the fundus firm and u/1. POC was discussed with patient including, OOB, advancing diet and using PO oxy & tylenol for pain She agreed to POC.  Will continue to monitor and encourage patient.

## 2019-10-02 NOTE — Op Note (Addendum)
10/02/2019  10:09 AM  PATIENT:  Olivia [name]  33 y.o. female  PRE-OPERATIVE DIAGNOSIS:  Unscheduled Cesarean Section, See Delivery Summary  pregnancy 38 weeks, 5 days, early labor, previous cesarean section not for trial of labor, rule out abruption, desire for elective sterilization Preeclampsia with severe features (blood pressure) history of substance abuse POST-OPERATIVE DIAGNOSIS:  Unscheduled Cesarean Section, See Delivery Summary Pregnancy 38 weeks 5 days delivered, repeat cesarean section, elective sterilization, inadvertent cystotomy.  Pelvic adhesions, Preeclampsia with severe features (blood pressure), history of substance abuse Thick meconium stained amniotic fluid, oligohydramnios PROCEDURE:  Procedure(s): CESAREAN SECTION (N/A), repeat low transverse cervical cesarean section, bilateral tubal sterilization (Filshie clips), repair of bladder incidental cystotomy  SURGEON:  Surgeon(s) and Role:    Jonnie Kind, MD - Primary    * Clarnce Flock, MD - Fellow  PHYSICIAN ASSISTANT: Arlina Robes, MD.  A second physician was needed due to complexity of case for bladder repair An experienced assistant was required given the standard of surgical care given the complexity of the case. This assistant was needed for exposure, and retraction . He provided confirmation of results ,  for overall help during the procedure.   ASSISTANTS: none   ANESTHESIA:   spinal  EBL:  50 mL   BLOOD ADMINISTERED:none  DRAINS: Urinary Catheter (Foley) Foley will be left in x1 week  LOCAL MEDICATIONS USED:  NONE  SPECIMEN:  Source of Specimen:  Placenta to labor and delivery correction placenta to pathology  DISPOSITION OF SPECIMEN:  PATHOLOGY  COUNTS:  YES  TOURNIQUET:  * No tourniquets in log *  DICTATION: .Dragon Dictation  PLAN OF CARE: Admit to inpatient   PATIENT DISPOSITION:  PACU - hemodynamically stable.   Delay start of Pharmacological VTE agent (>24hrs) due to  surgical blood loss or risk of bleeding: yes Indications 33 year old female, presenting with active contractions describing them as this is very different to last time, moderate vaginal bleeding with cervical dilation to 2 cm requesting repeat cesarean section.  She confirmed desire for permanent sterilization.  She also confirmed recent substance abuse reporting heroin being offered to her and accepted in the last week as well as oxycodone Clinical presentation suggestive of abruption fetal heart rate tracing category 1, taken promptly to the OR for repeat cesarean section and tubal sterilization with Filshie clips.  findings: Bladder adherent to the anterior abdominal wall and to the uterus up to approximately the level of the round ligaments, resulting in inadvertent cystotomy during efforts at peritoneal entry.  Thick pea soup meconium discoloration of scanty amniotic fluid.    Details of procedure: Patient was taken the operating room, spinal anesthesia obtained and abdomen prepped and draped.  Timeout was conducted.  Ancef was administered 2 g.  Procedure was confirmed by surgical team.  Transverse lower abdominal incision was performed in a controlled fashion with sharp dissection of the fascia off of the underlying musculature and peritoneal cavity entry performed high in the midline.  Despite this high location, and layer by layer progress using hemostats to elevate the peritoneum, upon performing what appeared to be peritoneal entry through a thin layer of tissue,, upon placing the index finger in what was initially thought to be the peritoneum, there was a limit to moving the finger cephalad, and and sweeping the finger inferiorly the Foley bulb was encountered.  Cystotomy was diagnosed The the fascial dissection off of the underlying rectus muscles was performed even wider and higher, and after exposing the  midline significantly higher than is normally the case, and using a finger in the dome of  the bladder to identify the upper aspects of the bladder we, we were able to enter the peritoneal cavity just above the bladder this was extended cephalad and then released transversely to achieve satisfactory peritoneal opening the bladder was adherent to the anterior uterus almost up to the round ligaments and was quite thin.  The bladder was sharply dissected off of its thin attachments to the anterior uterus.  Restoration of normal anatomy was attempted and the vesicouterine reflection of peritoneum identified Bladder flap was developed.  Alexis retractor was positioned.  The lower uterine segment could be identified and be confirmed as being well away from the lower portions of the bladder.  A transverse uterine incision was made extended using transverse and cephalad traction and countertraction, and thick meconium encountered without malodor.  Fetal vertex was guided in the incision by Dr. Gaynell Face at and fundal pressure applied and the baby delivered without difficulty.  Bulb suctioning was performed during the 1 minute wait until cord was clamped with vigorous crying by the baby.  See pediatric notes regarding the baby Initial efforts to contact urology was performed. The placenta delivered easily intact three-vessel cord.  The uterus was irrigated and swabbed out and confirmed as hemostatic.  The uterine closure was a 2 layer closure.  A figure-of-eight suture was placed on the patient's left side just in front of the uterine vessels due to the close proximity of the transverse incision to these vessels.  The uterine closure was then performed in standard running locking 2 layer fashion. Good hemostasis was achieved.  Tubal ligation: The uterus could be rotated such that the very short normal-appearing fallopian tubes on each side could be identified their entire length.  Filshie clip was placed at the midportion of each tube with proper positioning of the clip confirmed visually this was performed  on each side.  Due to the cystotomy Dr. Alysia Penna was asked to assist with cystotomy repair and scrubbed in.  It was felt that we could successfully close this so urology consult was canceled The bladder could be irrigated out and the 5 cm to 6 cm cystotomy was confirmed as having clean margins located in the dome of the bladder several centimeters away from the bladder trigone.  The Foley bulb was intact.  The uterine side of the bladder was inspected carefully and was hemostatic and intact. Cystotomy repair was then performed transversely in a 2 layer fashion using Allis clamps to grasp the edges of the defect, then a continuous running 2-0 chromic closure first layer followed by an oversewn second layer of 2-0 chromic was performed and then sterile milk used to fill the bladder to 300 cc with no loss of sterile milk into the surgical field.  Bladder was deflated, surgical equipment removed, anterior peritoneum closed using 2-0 Vicryl, and sponge and needle count correct The fascia was closed using running 0 Vicryl.  Subcutaneous tissues were reapproximated using horizontal mattresses of 2-0 plain followed by subcuticular 4-0 Vicryl closure the skin sponge and needle counts were correct.  Patient remained quite quiet throughout the case.  Patient will go to specialty care for magnesium sulfate for 24 hours. Foley bulb will be left in for 1 week.

## 2019-10-02 NOTE — Consult Note (Signed)
Neonatology Note:   Attendance at C-section:    I was asked by Dr. Glo Herring to attend this repeat C/S at term. The mother is a [id number], GBS unk with no prenatal care complicated by [facility] no meds and h/o drug use. EMS called due to labor pains.   IV magnesium had been started on arrival plus labetalol, decadron, and Ancef.    ROM 0h 57m prior to delivery, fluid thick meconium. Infant vigorous with good spontaneous cry and tone. +60 sec DCC. Needed minimal bulb suctioning. Exam unremarkable, 3 vessel cord.   Ap 8/9. Lungs clear to ausc in DR. No wob or tachypnea. Prenatal labs+ pending. To CN to care of Pediatrician.  Monia Sabal Katherina Mires, MD

## 2019-10-02 NOTE — MAU Note (Signed)
Pt c/o contractions that started around 3pm yesterday. Pt reports vaginal bleeding that started about an hour ago. Pt denies leaking of fluid. Pt reports normal fetal movement.

## 2019-10-02 NOTE — Progress Notes (Signed)
Dr. Jillyn Hidden notified of pt status and probable c/section delivery. OR charge RN Luanna Cole notified.

## 2019-10-02 NOTE — Brief Op Note (Addendum)
10/02/2019  10:09 AM  PATIENT:  Olivia [name]  33 y.o. female  PRE-OPERATIVE DIAGNOSIS:  Unscheduled Cesarean Section, See Delivery Summary  pregnancy 38 weeks, 5 days, early labor, previous cesarean section not for trial of labor, rule out abruption, desire for elective sterilization Preeclampsia with severe features (blood pressure) history of substance abuse POST-OPERATIVE DIAGNOSIS:  Unscheduled Cesarean Section, See Delivery Summary Pregnancy 38 weeks 5 days delivered, repeat cesarean section, elective sterilization, inadvertent cystotomy.  Pelvic adhesions, Preeclampsia with severe features (blood pressure), history of substance abuse Thick meconium stained amniotic fluid, oligohydramnios PROCEDURE:  Procedure(s): CESAREAN SECTION (N/A), repeat low transverse cervical cesarean section, bilateral tubal sterilization (Filshie clips), repair of bladder incidental cystotomy  SURGEON:  Surgeon(s) and Role:    Tilda Burrow, MD - Primary    * Crissie Reese, Mary Sella, MD - Fellow  PHYSICIAN ASSISTANT: Nettie Elm, MD.  A second physician was needed due to complexity of case for bladder repair  ASSISTANTS: none   ANESTHESIA:   spinal  EBL:  50 mL   BLOOD ADMINISTERED:none  DRAINS: Urinary Catheter (Foley) Foley will be left in x1 week  LOCAL MEDICATIONS USED:  NONE  SPECIMEN:  Source of Specimen:  Placenta to labor and delivery correction placenta to pathology  DISPOSITION OF SPECIMEN:  PATHOLOGY  COUNTS:  YES  TOURNIQUET:  * No tourniquets in log *  DICTATION: .Dragon Dictation  PLAN OF CARE: Admit to inpatient   PATIENT DISPOSITION:  PACU - hemodynamically stable.   Delay start of Pharmacological VTE agent (>24hrs) due to surgical blood loss or risk of bleeding: yes Indications 33 year old female, presenting with active contractions describing them as this is very different to last time, moderate vaginal bleeding with cervical dilation to 2 cm requesting repeat  cesarean section.  She confirmed desire for permanent sterilization.  She also confirmed recent substance abuse reporting heroin being offered to her and accepted in the last week as well as oxycodone Clinical presentation suggestive of abruption fetal heart rate tracing category 1, taken promptly to the OR for repeat cesarean section and tubal sterilization with Filshie clips.  findings: Bladder adherent to the anterior abdominal wall and to the uterus up to approximately the level of the round ligaments, resulting in inadvertent cystotomy during efforts at peritoneal entry.  Thick pea soup meconium discoloration of scanty amniotic fluid.    Details of procedure: Patient was taken the operating room, spinal anesthesia obtained and abdomen prepped and draped.  Timeout was conducted.  Ancef was administered 2 g.  Procedure was confirmed by surgical team.  Transverse lower abdominal incision was performed in a controlled fashion with sharp dissection of the fascia off of the underlying musculature and peritoneal cavity entry performed high in the midline.  Despite this high location, and layer by layer progress using hemostats to elevate the peritoneum, upon performing what appeared to be peritoneal entry through a thin layer of tissue,, upon placing the index finger in what was initially thought to be the peritoneum, there was a limit to moving the finger cephalad, and and sweeping the finger inferiorly the Foley bulb was encountered.  Cystotomy was diagnosed The the fascial dissection off of the underlying rectus muscles was performed even wider and higher, and after exposing the midline significantly higher than is normally the case, and using a finger in the dome of the bladder to identify the upper aspects of the bladder we, we were able to enter the peritoneal cavity just above the bladder this  was extended cephalad and then released transversely to achieve satisfactory peritoneal opening the bladder was  adherent to the anterior uterus almost up to the round ligaments and was quite thin.  The bladder was sharply dissected off of its thin attachments to the anterior uterus.  Restoration of normal anatomy was attempted and the vesicouterine reflection of peritoneum identified Bladder flap was developed.  Alexis retractor was positioned.  The lower uterine segment could be identified and be confirmed as being well away from the lower portions of the bladder.  A transverse uterine incision was made extended using transverse and cephalad traction and countertraction, and thick meconium encountered without malodor.  Fetal vertex was guided in the incision by Dr. Pearletha Forge at and fundal pressure applied and the baby delivered without difficulty.  Bulb suctioning was performed during the 1 minute wait until cord was clamped with vigorous crying by the baby.  See pediatric notes regarding the baby Initial efforts to contact urology was performed. The placenta delivered easily intact three-vessel cord.  The uterus was irrigated and swabbed out and confirmed as hemostatic.  The uterine closure was a 2 layer closure.  A figure-of-eight suture was placed on the patient's left side just in front of the uterine vessels due to the close proximity of the transverse incision to these vessels.  The uterine closure was then performed in standard running locking 2 layer fashion. Good hemostasis was achieved.  Tubal ligation: The uterus could be rotated such that the very short normal-appearing fallopian tubes on each side could be identified their entire length.  Filshie clip was placed at the midportion of each tube with proper positioning of the clip confirmed visually this was performed on each side.  Due to the cystotomy Dr. Rip Harbour was asked to assist with cystotomy repair and scrubbed in.  It was felt that we could successfully close this so urology consult was canceled The bladder could be irrigated out and the 5 cm to 6 cm  cystotomy was confirmed as having clean margins located in the dome of the bladder several centimeters away from the bladder trigone.  The Foley bulb was intact.  The uterine side of the bladder was inspected carefully and was hemostatic and intact. Cystotomy repair was then performed transversely in a 2 layer fashion using Allis clamps to grasp the edges of the defect, then a continuous running 2-0 chromic closure first layer followed by an oversewn second layer of 2-0 chromic was performed and then sterile milk used to fill the bladder to 300 cc with no loss of sterile milk into the surgical field.  Bladder was deflated, surgical equipment removed, anterior peritoneum closed using 2-0 Vicryl, and sponge and needle count correct The fascia was closed using running 0 Vicryl.  Subcutaneous tissues were reapproximated using horizontal mattresses of 2-0 plain followed by subcuticular 4-0 Vicryl closure the skin sponge and needle counts were correct.  Patient remained quite quiet throughout the case.  Patient will go to specialty care for magnesium sulfate for 24 hours. Foley bulb will be left in for 1 week.

## 2019-10-02 NOTE — Transfer of Care (Signed)
Immediate Anesthesia Transfer of Care Note  Patient: Olivia [name]  Procedure(s) Performed: CESAREAN SECTION (N/A )  Patient Location: PACU  Anesthesia Type:Spinal  Level of Consciousness: awake, alert  and patient cooperative  Airway & Oxygen Therapy: Patient Spontanous Breathing  Post-op Assessment: Report given to RN and Post -op Vital signs reviewed and stable  Post vital signs: Reviewed and stable  Last Vitals:  Vitals Value Taken Time  BP 96/47 10/02/19 0928  Temp    Pulse 50 10/02/19 0929  Resp 15 10/02/19 0929  SpO2 98 % 10/02/19 0929  Vitals shown include unvalidated device data.  Last Pain:  Vitals:   10/02/19 0700  TempSrc:   PainSc: 10-Worst pain ever      Patients Stated Pain Goal: 0 (16/01/09 3235)  Complications: No apparent anesthesia complications

## 2019-10-02 NOTE — MAU Note (Signed)
[facility] notified. MB charge nurse notified.  Morgan charge nurse notified.

## 2019-10-02 NOTE — H&P (Signed)
Olivia [name] is a 33 y.o. female presenting via EMS for labor check. She endorses painful abdominal contractions q 5 minutes since 3pm on 10/01/19. She also endorses a gush of fluid at that time. She has not had prenatal care this pregnancy beyond MAU and ED visits. OB history includes cesarean in 2019. She desires repeat cesarean.  Patient endorses history of Chronic Hypertension, not on medication. She denies headache, visual disturbances, new onset weight gain or swelling, shortness of breath. She also denies decreased fetal movement, fever, falls, or recent illness.   Patient states she has been NPO since 11pm on 10/01/2019.  OB History    Gravida  4   Para  1   Term  1   Preterm      AB  2   Living  1     SAB  1   TAB  1   Ectopic      Multiple  0   Live Births  1          Past Medical History:  Diagnosis Date  . Anxiety   . Bipolar 1 disorder (HCC)   . Chronic hypertension during pregnancy, antepartum 11/07/2017  . Cocaine abuse (HCC)   . Elevated hemoglobin A1c 06/25/2017  . Gestational diabetes   . Hypertension   . Low vitamin D level 06/25/2017  . Migraine   . Obesity affecting pregnancy in second trimester 06/20/2017  . Ovarian cyst   . PROM (premature rupture of membranes) 12/05/2017  . PTSD (post-traumatic stress disorder)   . Rh negative state in antepartum period 06/25/2017  . Supervision of high risk pregnancy, antepartum 12/03/2014    Clinic [facility] Prenatal Labs Dating LMP Blood type: O/Negative/-- (09/12 1132)  Genetic Screen 1 Screen:    AFP:     Quad:     NIPS:Mat21; normal Antibody:Negative (09/12 1132) Anatomic [place] Normal female fetus at 19 wks Rubella: 1.58 (09/12 1132) GTT A1C: prediabetes, Early:               Third trimester:  RPR: Non Reactive (12/18 1403)  Flu vaccine  08/16/17 HBsAg: Negative (09/12 1132)  TDaP vacci   Past Surgical History:  Procedure Laterality Date  . CESAREAN SECTION N/A 12/06/2017   Procedure: CESAREAN SECTION;   Surgeon: Tilda Burrow, MD;  Location: [facility] BIRTHING SUITES;  Service: Obstetrics;  Laterality: N/A;  . WISDOM TOOTH EXTRACTION     Family History: family history includes ADD / ADHD in her brother; Anxiety disorder in her father and mother; Bipolar disorder in her father; Cancer in her father; Depression in her father and mother; Diabetes in her father; Heart attack (age of onset: 66) in her maternal grandmother; Heart disease in her maternal grandmother; Hypertension in her father and mother; Migraines in her mother; OCD in her father; Schizophrenia in her maternal uncle; Seizures in her mother. Social History:  reports that she has been smoking cigarettes. She has been smoking about 1.00 pack per day. She has never used smokeless tobacco. She reports previous alcohol use. She reports current drug use. Drug: Cocaine.     Maternal Diabetes: Unknown Genetic Screening:N/A Maternal Ultrasounds/Referrals: Other: Fetal Ultrasounds or other Referrals:  None Maternal Substance Abuse:  Yes:  Type: Other: History of Cocaine use Significant Maternal Medications:  N/A Significant Maternal Lab Results:  P:Cr 0.74  Other Comments:  No prenatal care  Review of Systems  Constitutional: Negative for chills, fatigue and fever.  Respiratory: Negative for shortness of breath.  Gastrointestinal: Positive for abdominal pain.  Genitourinary: Positive for vaginal bleeding.  Musculoskeletal: Negative for back pain.  All other systems reviewed and are negative.  Dilation: 2 Effacement (%): 70 Station: -3 Exam by:: S. Jaynia Fendley,CNM Blood pressure (!) 143/87, pulse (!) 135, temperature 98.9 F (37.2 C), temperature source Oral, resp. rate (!) 22, height 5\' 1"  (1.549 m), weight 77.3 kg, last menstrual period 01/04/2019, not currently breastfeeding. Physical Exam  Nursing note and vitals reviewed. Constitutional: She is oriented to person, place, and time. She appears well-developed and well-nourished.   Cardiovascular: Normal rate.  Respiratory: Effort normal and breath sounds normal.  GI: There is no abdominal tenderness. There is no CVA tenderness.  Gravid  Genitourinary:    Vagina and uterus normal.   Neurological: She is alert and oriented to person, place, and time.  Skin: Skin is warm and dry.  Psychiatric: She has a normal mood and affect. Her behavior is normal. Judgment and thought content normal.    Prenatal labs: ABO, Rh: --/--/O NEG (12/24 0412) Antibody: NEG (12/24 0412) Rubella:  Immune per previous pregnancy labs RPR:   In work HBsAg:   Neg HIV:   In work GBS:   Unknown (collected)  Assessment/Plan: --33 y.o. [id number] at [redacted]w[redacted]d  --Reactive fetal tracing --Chronic Hypertension with Superimposed Preeclampsia with Severe Features (BP) --Desires repeat cesarean, BTL --Magnesium Sulfate initiated in MAU --Per Dr. Rip Harbour, prep for [place], CNM 10/02/2019, 7:09 AM

## 2019-10-02 NOTE — Anesthesia Procedure Notes (Signed)
Spinal  Patient location during procedure: OR Start time: 10/02/2019 7:51 AM End time: 10/02/2019 7:54 AM Staffing Performed: anesthesiologist  Anesthesiologist: Josephine Igo, MD Preanesthetic Checklist Completed: patient identified, IV checked, site marked, risks and benefits discussed, surgical consent, monitors and equipment checked, pre-op evaluation and timeout performed Spinal Block Patient position: sitting Prep: DuraPrep and site prepped and draped Patient monitoring: heart rate, cardiac monitor, continuous pulse ox and blood pressure Approach: midline Location: L3-4 Injection technique: single-shot Needle Needle type: Pencan  Needle gauge: 24 G Needle length: 9 cm Needle insertion depth: 6 cm Assessment Sensory level: T4 Additional Notes Patient tolerated procedure well. Adequate sensory level.

## 2019-10-02 NOTE — Addendum Note (Signed)
Addendum  created 10/02/19 1322 by Josephine Igo, MD   Intraprocedure Event edited

## 2019-10-02 NOTE — Discharge Summary (Signed)
Postpartum Discharge Summary     Patient Name: Olivia [name] DOB: March 30, 1986 MRN: [id number]  Date of admission: 10/02/2019 Delivering Provider: Jonnie Kind   Date of discharge: 10/05/2019  Admitting diagnosis: Severe pre-eclampsia [O14.10] Status post cesarean section [Z98.891] Intrauterine pregnancy: [redacted]w[redacted]d    Secondary diagnosis:  Active Problems:   Chronic hypertension with superimposed pre-eclampsia   Oligohydramnios   Severe pre-eclampsia   Status post cesarean section   H/O tubal ligation  Additional problems:      Discharge diagnosis: Term Pregnancy Delivered and CHTN with superimposed preeclampsia                                                                                                Post partum procedures:postpartum tubal ligation  Augmentation: n/a  Complications: Bladder injury during surgery. Has indwelling catheter.  Hospital course:  Unsceduled C/S   33y.o. yo [id number] admitted to the hospital 10/02/2019 for scheduled cesarean section with the following indication: arrived to MAU via EMS for labor check. No PNC outside of ED and MAU visits. She was found to be in early labor and also to have cHTN with superimposed pre-eclampsia, for which she was taken for repeat cesarean.  Membrane Rupture Time/Date: 8:20 AM ,10/02/2019   Patient delivered a Viable infant.10/02/2019  Details of operation can be found in separate operative note.  Pateint had an uncomplicated postpartum course.  She is ambulating, tolerating a regular diet, passing flatus, and urinating well. Patient is discharged home in stable condition on  10/05/19        Delivery time: 8:21 AM    Magnesium Sulfate received: Yes BMZ received: No Rhophylac:Yes MMR:No Transfusion:No  Physical exam  Vitals:   10/04/19 1922 10/05/19 0018 10/05/19 0306 10/05/19 0736  BP: 139/88 130/83 137/79 135/90  Pulse: 82 83 83 75  Resp: 19 20 18 18   Temp: 98 F (36.7 C) 98.1 F (36.7  C) 98 F (36.7 C) 97.9 F (36.6 C)  TempSrc: Oral Oral Oral   SpO2: 100%  100%   Weight:      Height:       General: alert, cooperative and no distress Lochia: appropriate Uterine Fundus: firm Incision: Healing well with no significant drainage, No significant erythema, Dressing is clean, dry, and intact DVT Evaluation: No evidence of DVT seen on physical exam. Negative Homan's sign. No cords or calf tenderness. Labs: Lab Results  Component Value Date   WBC 14.9 (H) 10/03/2019   HGB 9.6 (L) 10/03/2019   HCT 28.9 (L) 10/03/2019   MCV 86.8 10/03/2019   PLT 230 10/03/2019   CMP Latest Ref Rng & Units 10/03/2019  Glucose 70 - 99 mg/dL 119(H)  BUN 6 - 20 mg/dL 6  Creatinine 0.44 - 1.00 mg/dL 0.70  Sodium 135 - 145 mmol/L 135  Potassium 3.5 - 5.1 mmol/L 3.7  Chloride 98 - 111 mmol/L 102  CO2 22 - 32 mmol/L 24  Calcium 8.9 - 10.3 mg/dL 7.2(L)  Total Protein 6.5 - 8.1 g/dL 5.1(L)  Total Bilirubin 0.3 - 1.2 mg/dL <0.1(L)  Alkaline  Phos 38 - 126 U/L 131(H)  AST 15 - 41 U/L 23  ALT 0 - 44 U/L 16    Discharge instruction: per After Visit Summary and "Baby and Me Booklet".  After visit meds:  Allergies as of 10/05/2019      Reactions   Ibuprofen Anaphylaxis, Swelling      Medication List    STOP taking these medications   glucose blood test strip Commonly known as: Accu-Chek Guide     TAKE these medications   cephALEXin 500 MG capsule Commonly known as: KEFLEX Take 1 capsule (500 mg total) by mouth 3 (three) times daily.   enalapril 10 MG tablet Commonly known as: VASOTEC Take 1 tablet (10 mg total) by mouth daily. Start taking on: October 06, 2019   flavoxATE 100 MG tablet Commonly known as: URISPAS Take 1 tablet (100 mg total) by mouth 3 (three) times daily.   Oxycodone HCl 10 MG Tabs Take 1 tablet (10 mg total) by mouth every 4 (four) hours as needed for moderate pain or severe pain.   Prenate Pixie 10-0.6-0.4-200 MG Caps Take 1 tablet by mouth  daily.   senna-docusate 8.6-50 MG tablet Commonly known as: Senokot-S Take 2 tablets by mouth daily. Start taking on: October 06, 2019       Diet: routine diet  Activity: Advance as tolerated. Pelvic rest for 6 weeks.   Outpatient follow up:4 weeks Follow up Appt:No future appointments. Follow up Visit: [place] [facility] Follow up.   Specialty: Obstetrics and Gynecology Contact information: [address] [place], [place] [id number] [place] [address] [phone]          Please schedule this patient for Postpartum visit in: 4 weeks with the following provider: MD, ideally one who is suboxone waivered For C/S patients schedule nurse incision check in weeks 2 weeks: yes High risk pregnancy complicated by: cHTN with superimposed pre-eclampsia, polysubstance abuse, no prenatal care, repeat cesarean compilcated by bladder injury Delivery mode:  CS Anticipated Birth Control:  BTL done PP PP Procedures needed: BP check, incision check at 2 weeks, 1 wk foley check and removal s/p bladder injury during cesarean, possible referral to substance abuse treatment  Schedule Integrated BH visit: yes    Newborn Data: Live born female  Birth Weight: 6 lb 1.7 oz (2770 g) APGAR: 8, 9  Newborn Delivery   Birth date/time: 10/02/2019 08:21:00 Delivery type: C-Section, Low Transverse Trial of labor: No C-section categorization: Repeat      Baby Feeding: Bottle Disposition: NICU   10/05/2019 Truett Mainland, DO

## 2019-10-03 LAB — COMPREHENSIVE METABOLIC PANEL
ALT: 16 U/L (ref 0–44)
AST: 23 U/L (ref 15–41)
Albumin: 2.3 g/dL — ABNORMAL LOW (ref 3.5–5.0)
Alkaline Phosphatase: 131 U/L — ABNORMAL HIGH (ref 38–126)
Anion gap: 9 (ref 5–15)
BUN: 6 mg/dL (ref 6–20)
CO2: 24 mmol/L (ref 22–32)
Calcium: 7.2 mg/dL — ABNORMAL LOW (ref 8.9–10.3)
Chloride: 102 mmol/L (ref 98–111)
Creatinine, Ser: 0.7 mg/dL (ref 0.44–1.00)
GFR calc Af Amer: >60 mL/min (ref >60)
GFR calc non Af Amer: >60 mL/min (ref >60)
Glucose, Bld: 119 mg/dL — ABNORMAL HIGH (ref 70–99)
Potassium: 3.7 mmol/L (ref 3.5–5.1)
Sodium: 135 mmol/L (ref 135–145)
Total Bilirubin: <0.1 mg/dL — ABNORMAL LOW (ref 0.3–1.2)
Total Protein: 5.1 g/dL — ABNORMAL LOW (ref 6.5–8.1)

## 2019-10-03 LAB — CBC
HCT: 28.9 % — ABNORMAL LOW (ref 36.0–46.0)
Hemoglobin: 9.6 g/dL — ABNORMAL LOW (ref 12.0–15.0)
MCH: 28.8 pg (ref 26.0–34.0)
MCHC: 33.2 g/dL (ref 30.0–36.0)
MCV: 86.8 fL (ref 80.0–100.0)
Platelets: 230 10*3/uL (ref 150–400)
RBC: 3.33 MIL/uL — ABNORMAL LOW (ref 3.87–5.11)
RDW: 11.8 % (ref 11.5–15.5)
WBC: 14.9 10*3/uL — ABNORMAL HIGH (ref 4.0–10.5)
nRBC: 0 % (ref 0.0–0.2)

## 2019-10-03 MED ORDER — ONDANSETRON HCL 4 MG PO TABS
4.0000 mg | ORAL_TABLET | Freq: Four times a day (QID) | ORAL | Status: DC | PRN
  Administered 2019-10-03: 4 mg via ORAL
  Filled 2019-10-03: qty 1

## 2019-10-03 MED ORDER — LABETALOL HCL 200 MG PO TABS
400.0000 mg | ORAL_TABLET | Freq: Two times a day (BID) | ORAL | Status: DC
  Administered 2019-10-03 – 2019-10-05 (×5): 400 mg via ORAL
  Filled 2019-10-03 (×5): qty 2

## 2019-10-03 MED ORDER — NICOTINE 21 MG/24HR TD PT24
21.0000 mg | MEDICATED_PATCH | Freq: Every day | TRANSDERMAL | Status: DC
  Administered 2019-10-03 – 2019-10-05 (×3): 21 mg via TRANSDERMAL
  Filled 2019-10-03 (×4): qty 1

## 2019-10-03 MED ORDER — ALPRAZOLAM 0.25 MG PO TABS
0.2500 mg | ORAL_TABLET | Freq: Three times a day (TID) | ORAL | Status: DC | PRN
  Administered 2019-10-03 – 2019-10-05 (×6): 0.25 mg via ORAL
  Filled 2019-10-03 (×9): qty 1

## 2019-10-03 MED ORDER — MAGNESIUM HYDROXIDE 400 MG/5ML PO SUSP
10.0000 mL | Freq: Every day | ORAL | Status: DC | PRN
  Administered 2019-10-03: 10 mL via ORAL
  Filled 2019-10-03: qty 30

## 2019-10-03 MED ORDER — BISACODYL 10 MG RE SUPP
10.0000 mg | Freq: Once | RECTAL | Status: AC
  Administered 2019-10-03: 16:00:00 10 mg via RECTAL
  Filled 2019-10-03: qty 1

## 2019-10-03 NOTE — Progress Notes (Signed)
Per CPS, pt's baby is to remain in nursery until Tuesday 10/07/2019. SW aware. No baby visits by [name] or FOB unless supervised.

## 2019-10-03 NOTE — Progress Notes (Signed)
Attempted to get pt out of bed but pt states "I am too upset right now. I need something for pain and I will try 15 minutes after that."

## 2019-10-03 NOTE — Progress Notes (Signed)
Pt. Called staff to room, NT Haylee went to room Pt. Stated she was "withdrawing from Heroin and needed something." RN to the room to assess. Pt. Told RN this same concerns. She also was treated for 9/10 abdominal pain. Support given and stayed by the bedside. Pt. Was restless and complained of both hot and cold sensations.  She asked me to get the doctor at 2:55. Dr. Hulan Fray paged.

## 2019-10-03 NOTE — Clinical Social Work Maternal (Signed)
CLINICAL SOCIAL WORK MATERNAL/CHILD NOTE  Patient Details  Name: Olivia [name] MRN: [id number] Date of Birth: Jul 18, 1986  Date:  10/03/2019  Clinical Social Worker Initiating Note:  Laurey Arrow Date/Time: Initiated:  10/03/19/1053     Child's Name:  Olivia [name]   Biological Parents:  Mother([name] declined to provide any information about FOB and reported ,"I'm unsure if he is going to be involved.")   Need for Interpreter:  None   Reason for Referral:  Behavioral Health Concerns, Current Substance Use/Substance Use During Pregnancy  , Late or No Prenatal Care     Address:  [address] [place] [address]    Phone number:  [phone] (home)     Additional phone number:   Household Members/Support Persons (HM/SP):   Household Member/Support Person 1([name] reported that she resides with her mother Laurelyn Terrero and her father.)   HM/SP Name Relationship DOB or Age  HM/SP -[place]. son 12/06/2017  HM/SP -2        HM/SP -3        HM/SP -4        HM/SP -5        HM/SP -6        HM/SP -7        HM/SP -8          Natural Supports (not living in the home):  Extended Family, Immediate Family([name] also reported that her sisters will provide support if needed)   Professional Supports: Organized support group (Comment)([name] reported she is an established participant with the [employer].)   Employment: Part-time   Type of Work: [name] works as an Chief of Staff:  Public librarian arranged:    Museum/gallery curator Resources:  Medicaid   Other Resources:  [employer], Physicist, medical     Cultural/Religious Considerations Which May Impact Care:  None reported  Strengths:  Ability to meet basic needs  , Lexicographer chosen, Home prepared for child  , Understanding of illness   Psychotropic Medications:         Pediatrician:    [place]olic[place] area  Pediatrician List:   [facility] ([address])  [place]      Pediatrician Fax Number:    Risk Factors/Current Problems:      Cognitive State:  Alert  , Able to Concentrate  , Linear Thinking  , Insightful     Mood/Affect:  Interested  , Calm  , Comfortable  , Relaxed  , Flat     CSW Assessment: CSW met with [name] in room 101 to complete an assessment for no PNC, MH hx, and SA hx. When CSW arrived, [name]'s bedside nurse was transitioning the infant to the nursery and [name] was resting in bed.  CSW explained CSW's role and encouraged [name] to ask questions during the assessment.  [name] appeared forthcoming, easy to engage, and was receptive to meeting with CSW.   CSW inquired about [name]'s lack of PNC.  [name] communicated her fear of COVID-19 interfered with her receiving care. CSW assessed for barriers for follow-up visits for infant and [name] denied all barriers.  [name] identified [facility]) as infant's provider. CSW explained the hospital's policy regarding not having  adequate PNC; [name] was understanding. 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  CSW made [name] aware of drug screens for infant and [name] was understanding.  CSW also made [name] aware that CSW was making a report to [facility] CPS due to [name]'s recent substance use. [name] reported a hx of CPS involvement and declined information regarding CPS investigation due to her previous interactions. [name] reports being prepared for infant and communicated having all essential items including a new car seat and a bassinet.  CSW offered [name] resources for outpatient and inpatient SA counseling and [name] declined and stated, "I'm good, I don't have a problem."   CSW asked about [name]'s MH hx and [name] shared a dx of Bipolar and depression. [name] reported she was dx in 2016 and was on a medication regiment until 2019.  Per [name], after becoming pregnant, [name] discontinue the use of her medication and she has had no  symptoms. CSW educated [name] about PPD and informed [name] of possible supports and interventions to decrease PPD.  CSW also encouraged [name] to seek medical attention if needed for increased signs and symptoms for PPD. [name] denied PPD with [name]'s oldest child. CSW assessed for safety and [name] denied SI, HI, and DV. CSW presented with insight and awareness and expressed feeling comfortable seeking help if needed. CSW reviewed safe sleep and SIDS. [name] was knowledgeable.  [name] did not have any questions or concerns at this time, and CSW thanked [name] for allowing CSW to meet with [name].  CSW made a [facility] CPS report to [employer].  There are barriers to infant discharging to [name].   CSW Plan/Description:  Sudden Infant Death Syndrome (SIDS) Education, Perinatal Mood and Anxiety Disorder (PMADs) Education, Neonatal Abstinence Syndrome (NAS) Education, Other Patient/Family Education, [place], Other Information/Referral to [employer], Child Copy Report  , CSW Awaiting CPS Disposition Plan, CSW Will Continue to Monitor Umbilical Cord Tissue Drug Screen Results and Make Report if Warranted   Laurey Arrow, MSW, LCSW Clinical Social Work [phone]    Dimple Nanas, LCSW 10/03/2019, 10:58 AM

## 2019-10-03 NOTE — Progress Notes (Signed)
Pt refused RHIG workup stating "I had labs drawn already today. Can you come back later?" Order placed for tomorrow am.

## 2019-10-03 NOTE — Progress Notes (Signed)
Subjective: Postpartum Day 1: Cesarean Delivery Patient reports incisional pain and tolerating PO.    Objective: Vital signs in last 24 hours: Temp:  [97.6 F (36.4 C)-98.3 F (36.8 C)] 97.8 F (36.6 C) (12/25 1127) Pulse Rate:  [74-82] 80 (12/25 1127) Resp:  [16-20] 16 (12/25 1127) BP: (138-168)/(80-101) 151/91 (12/25 1127) SpO2:  [97 %-100 %] 100 % (12/25 1127)  Physical Exam:  General: alert, cooperative and no distress Lochia: appropriate Uterine Fundus: firm Incision: no significant drainage DVT Evaluation: No evidence of DVT seen on physical exam. Negative Homan's sign. No cords or calf tenderness.  Recent Labs    10/02/19 0412 10/03/19 0541  HGB 10.7* 9.6*  HCT 33.0* 28.9*    Assessment/Plan: Status post Cesarean section. Doing well postoperatively.  Continue current care. Continue pain control Start labetalol 400mg  BID Mag d/c at [place] 10/03/2019, 12:38 PM

## 2019-10-03 NOTE — Progress Notes (Signed)
Dr. Hulan Fray aware of pt's symptoms. Verbal for Xanex 0.25 TID. Will consult for withdrawal in the AM.

## 2019-10-03 NOTE — Progress Notes (Signed)
CPS in room at bedside talking with mother at this time.

## 2019-10-03 NOTE — Progress Notes (Signed)
Went into room to get pt off bedpan, but patient states, "I'm not done. I'm still trying." Will check on her again in a few minutes.

## 2019-10-03 NOTE — Progress Notes (Signed)
Attempted to get pt up out of bed. Pt was able to sit on the side and then stand up. Pt completed 5 steps with full assist by NT. Pt c/o of pain and asked to be put back on bed. Pt asked for bedpan to have a BM. Pt on bedpan now.

## 2019-10-03 NOTE — Progress Notes (Signed)
Asked pt to ambulate but pt refused. Explained importance of ambulation in preventing blood clots and to assist in healing. Pt verbalizes understanding but still refuses.

## 2019-10-03 NOTE — Progress Notes (Signed)
CSW received a telephone call from [place] worker Wes Early. Per CPS, the reported was accepted as a 72 hour case (CPS has 72 hours to make contact with family). However, CPS worker, Adela Lank plans to make contact with family prior to 7 hours.  There are barriers to infant discharging to [name] until CPS communicate a safety disposition plan.   Laurey Arrow, MSW, LCSW Clinical Social Work [phone]

## 2019-10-03 NOTE — Progress Notes (Signed)
Upon assessment of baby at 2350, I noticed baby was jittery and belly was distended. I asked mom if baby had recently eaten and she was not aware. She has not wanted to hold baby, feed baby or interact with baby this PM shift. FOB was taking care of baby but left shortly before assessment due to an argument. Unable to get a history of feeds prior. With assessment I noticed baby's diaper was half off and the cotton balls were still in place and dry. She had not been changed. I wiped the baby gently and she peed and had a small BM. The urine was sent to lab but not adequate. It was noted to be orange in nature and small in amount.  Baby was also noted to be jittery, and tachypenic. Her abdomen was distended. I attempted to feed her but she projectile vomited very shortly after feed.  Due to [name] pain and lack of care for the baby I removed the baby from the room and notified nursery. [name] was informed of POC and had no objections.  I asked [name] if she felt safe should FOB come back and she said yes.  Baby with Nefer in CN.  Serum bili ordered. Will attempt another UDS.  

## 2019-10-03 NOTE — Progress Notes (Signed)
Call to Dr. Hulan Fray (2nd time, first time for pain issues and recvd. PRN IV dilaudid and urispas order) This time was after pt. Attempted to get OOB and called staff to her room. She had been shown the call bell and explained the POC for the evening which included letting her rest and attempt better pain control. She refused a 4 hr fundal check but did allow for me to ausculate her abdomen. Her abdomen was distended but she did let me palpate and faint hypoactive bowel sounds were heard. Likely gas as patient has not been OOB.  She also asked staff to take "all of this off of her" referring to her IV, foley, blankets, SCDs and SPO2 finger monitor. Blankets were removed as well as SCDs, pt. Educated on importance of SCDs but refused. Agreed to not have IV and foley removed.  Pt. Stated " I hope I'm not withdrawing" I said ok, how can I help you with that? You smoke? She said yes to 1 pack /day 21mg  patch verbal order. I asked her if there was anything else she thought might be causing her discomfort/that I could help her with and she said no. Will continue to monitor.

## 2019-10-04 LAB — CULTURE, BETA STREP (GROUP B ONLY)

## 2019-10-04 LAB — RH IG WORKUP (INCLUDES ABO/RH)
ABO/RH(D): O NEG
Fetal Screen: NEGATIVE
Gestational Age(Wks): 38
Unit division: 0

## 2019-10-04 MED ORDER — ENALAPRIL MALEATE 5 MG PO TABS
5.0000 mg | ORAL_TABLET | Freq: Every day | ORAL | Status: DC
  Administered 2019-10-04 – 2019-10-05 (×2): 5 mg via ORAL
  Filled 2019-10-04 (×2): qty 1

## 2019-10-04 NOTE — Progress Notes (Signed)
CSW met with [name] at bedside in 101 to provide her with an update regarding CPS involvement and discharge planning. CSW explained to [name] that there will be a CFT meeting with DSS social worker at [facility] on Tuesday (time is still to be determined). CSW informed [name] that the CPS report was not made due to the infant testing positive, but for [name] instead. [name] stated understand and admitted to opiate use during pregnancy. CPS social worker visited yesterday and created a safety plan with [name]. [name] was under the impression that the report was made due to the infant testing positive due to that being written on the safety plan. [name] was happy to inform CSW that the newborn's name is Nora and that she has all items at home needed for care. [name] expressed willingness to work with DSS on a safe discharge plan and treatment for her substance use.  [name] requested CSW speak with her FOB Reggie to inform him that the infant's UDS was negative, but cord was still pending. CSW spoke with Reggie after [name] placed call to him. CSW answered all his questions and encouraged his participation in CFT meeting. [name] and CSW discussed seriousness of situation and appropriate support was provided.  Roderic Lammert, MSW, LCSW-A Transitions of Care  Clinical Social Worker  [facility]  Medical ICU [phone]    

## 2019-10-04 NOTE — Progress Notes (Signed)
Social worker contacted per patient request to discuss lab result. Toya Smothers, RN

## 2019-10-05 ENCOUNTER — Other Ambulatory Visit: Payer: Self-pay | Admitting: Family Medicine

## 2019-10-05 ENCOUNTER — Encounter: Payer: Self-pay | Admitting: *Deleted

## 2019-10-05 MED ORDER — CEPHALEXIN 500 MG PO CAPS: 500.0000 mg | ORAL_CAPSULE | Freq: Three times a day (TID) | ORAL | 0 refills | Status: DC

## 2019-10-05 MED ORDER — OXYCODONE HCL 5 MG PO TABS
10.0000 mg | ORAL_TABLET | ORAL | Status: DC | PRN
  Administered 2019-10-05: 10 mg via ORAL
  Filled 2019-10-05: qty 2

## 2019-10-05 MED ORDER — SENNOSIDES-DOCUSATE SODIUM 8.6-50 MG PO TABS: 2.0000 | ORAL_TABLET | ORAL | 2 refills | Status: DC

## 2019-10-05 MED ORDER — ENALAPRIL MALEATE 10 MG PO TABS: 10.0000 mg | ORAL_TABLET | Freq: Every day | ORAL | 0 refills | Status: DC

## 2019-10-05 MED ORDER — OXYCODONE HCL 10 MG PO TABS: 10.0000 mg | ORAL_TABLET | ORAL | 0 refills | Status: DC | PRN

## 2019-10-05 MED ORDER — FLAVOXATE HCL 100 MG PO TABS: 100.0000 mg | ORAL_TABLET | Freq: Three times a day (TID) | ORAL | 0 refills | Status: DC

## 2019-10-05 MED ORDER — HYDROMORPHONE HCL 2 MG/ML IJ SOLN
2.0000 mg | Freq: Once | INTRAMUSCULAR | Status: AC
  Administered 2019-10-05: 2 mg via INTRAVENOUS
  Filled 2019-10-05: qty 1

## 2019-10-06 LAB — SURGICAL PATHOLOGY

## 2019-10-14 ENCOUNTER — Ambulatory Visit: Payer: Medicaid Other | Admitting: Advanced Practice Midwife

## 2019-10-16 ENCOUNTER — Encounter: Payer: Self-pay | Admitting: Nurse Practitioner

## 2019-10-16 ENCOUNTER — Ambulatory Visit: Payer: Medicaid Other | Admitting: Nurse Practitioner

## 2019-10-16 ENCOUNTER — Other Ambulatory Visit: Payer: Self-pay

## 2019-10-16 ENCOUNTER — Ambulatory Visit (INDEPENDENT_AMBULATORY_CARE_PROVIDER_SITE_OTHER): Payer: Medicaid Other | Admitting: Nurse Practitioner

## 2019-10-16 DIAGNOSIS — Z435 Encounter for attention to cystostomy: Secondary | ICD-10-CM

## 2019-10-16 DIAGNOSIS — O119 Pre-existing hypertension with pre-eclampsia, unspecified trimester: Secondary | ICD-10-CM

## 2019-10-16 DIAGNOSIS — Z1389 Encounter for screening for other disorder: Secondary | ICD-10-CM

## 2019-10-16 DIAGNOSIS — F1411 Cocaine abuse, in remission: Secondary | ICD-10-CM

## 2019-10-16 DIAGNOSIS — Z9851 Tubal ligation status: Secondary | ICD-10-CM

## 2019-10-16 DIAGNOSIS — Z9889 Other specified postprocedural states: Secondary | ICD-10-CM | POA: Insufficient documentation

## 2019-10-16 DIAGNOSIS — O99330 Smoking (tobacco) complicating pregnancy, unspecified trimester: Secondary | ICD-10-CM

## 2019-10-16 DIAGNOSIS — O24919 Unspecified diabetes mellitus in pregnancy, unspecified trimester: Secondary | ICD-10-CM

## 2019-10-16 NOTE — Progress Notes (Signed)
Post Partum Exam  Olivia [name] is a 34 y.o. [phone] female who presents for a postpartum visit. She is 2 weeks postpartum following a repeat cesarean delivery. I have fully reviewed the prenatal and intrapartum course. She had no prenatal care and preeclampsia at presentation for care. The delivery was at [redacted]w[redacted]d gestational age.  Anesthesia: spinal. Postpartum course has been complicated by the Foley catheter due to inadvertent cystostomy during LTCS . Baby's course has been good. Baby is feeding by bottle - provider unsure. Bleeding thin lochia. Bowel function is normal. Bladder function is Foley catheter. Patient is not sexually active. Contraception method is tubal ligation. Postpartum depression screening:neg  The following portions of the patient's history were reviewed and updated as appropriate: allergies, current medications, past family history, past medical history, past social history, past surgical history and problem list. Last pap smear done 06-20-17 and was Normal  Review of Systems Pertinent items noted in HPI and remainder of comprehensive ROS otherwise negative.    Objective:  Blood pressure (!) 146/95, pulse 74, weight 168 lb (76.2 kg), last menstrual period 01/04/2019, unknown if currently breastfeeding.  General:  alert, cooperative and mild distress due to the indwelling Foley catheter and it not being removed today   Breasts:  not done  Lungs: clear to auscultation bilaterally  Heart:  regular rate and rhythm, S1, S2 normal, no murmur, click, rub or gallop  Abdomen: soft, nontender, C/S incision is dry and healing, nontender            Pelvic deferred.     Foley catheter in place since 10-02-19.  Draining brown fluid with strings of blood  Assessment:    Postpartum exam done. Pap smear not done at today's visit. Pap due 06-2020 Chronic hypertension on medication Substance Abuse - to have intake at [facility] tomorrow for treament. Indwelling foley Catheter - referral made  for cystogram - has a couple more doses of keflex and will be finished with course. No prenatal care in pregnancy but with history of gestational diabetes in 2018 without any PP glucola testing - will schedule for next week. Smoker - advised to stop and that [facility] will likely assist with smoking cessation.  Motivated to quit with baby in household.  Plan:   1. Contraception: tubal ligation 2. Indwelling foley due to incision into bladder during surgery - Consult with Dr. Earlene Plater - ordered cystogram  3. Follow up in: 1 week or as needed. Will recheck BP, See if client is in treatment at [facility], check on cystogram appointment and have 2 hr glucola done.  Continue taking BP medication and will refer to internal medicine.  Nolene Bernheim, RN, MSN, NP-BC Nurse Practitioner, [facility] for [employer], [facility] 10/16/2019 4:38 PM

## 2019-10-20 ENCOUNTER — Telehealth: Payer: Self-pay

## 2019-10-20 NOTE — Telephone Encounter (Signed)
Pt called and reports she is having pain in her urethra where her foley catheter is inserted. She would like to know if she needs another antibiotic and if she can have a refill on pain medication. I advised pt that I am not authorized to fill this for her but that I will send a message to the provider and let her know what they say. Pt voices understanding.

## 2019-10-21 ENCOUNTER — Other Ambulatory Visit: Payer: Self-pay | Admitting: Nurse Practitioner

## 2019-10-21 MED ORDER — OXYCODONE HCL 10 MG PO TABS: 10.0000 mg | ORAL_TABLET | Freq: Four times a day (QID) | ORAL | 0 refills | Status: DC | PRN

## 2019-10-21 NOTE — Progress Notes (Signed)
Client called and is in pain.  Has been taking Oxycodone and is out.  Considering going to the ER for pain control.  Refilled 10 tablet of oxycodone 10 mg PO asking her to try to wean off pain medication.  Scheduled to be seen in the office on Thursday and will get urine culture then.  Also scheduled for cystogram on Friday.  Nolene Bernheim, RN, MSN, NP-BC Nurse Practitioner, [facility] for [employer], [facility] 10/21/2019 5:17 PM

## 2019-10-22 ENCOUNTER — Inpatient Hospital Stay (HOSPITAL_COMMUNITY)
Admission: AD | Admit: 2019-10-22 | Discharge: 2019-10-22 | Disposition: A | Discharge status: Home / Self Care | Payer: Medicaid Other | Attending: Obstetrics & Gynecology | Admitting: Obstetrics & Gynecology

## 2019-10-22 ENCOUNTER — Other Ambulatory Visit: Payer: Self-pay

## 2019-10-22 DIAGNOSIS — T839XXA Unspecified complication of genitourinary prosthetic device, implant and graft, initial encounter: Secondary | ICD-10-CM

## 2019-10-22 NOTE — MAU Note (Addendum)
Donia Ast NP in [facility] Room talking with pt. After talking with pt and making a plan of care pt d/c home from [facility] Room.

## 2019-10-22 NOTE — Discharge Instructions (Signed)
Postpartum Hypertension Postpartum hypertension is high blood pressure that remains higher than normal after childbirth. You may not realize that you have postpartum hypertension if your blood pressure is not being checked regularly. In most cases, postpartum hypertension will go away on its own, usually within a week of delivery. However, for some women, medical treatment is required to prevent serious complications, such as seizures or stroke. What are the causes? This condition may be caused by one or more of the following:  Hypertension that existed before pregnancy (chronic hypertension).  Hypertension that comes on as a result of pregnancy (gestational hypertension).  Hypertensive disorders during pregnancy (preeclampsia) or seizures in women who have high blood pressure during pregnancy (eclampsia).  A condition in which the liver, platelets, and red blood cells are damaged during pregnancy (HELLP syndrome).  A condition in which the thyroid produces too much hormones (hyperthyroidism).  Other rare problems of the nerves (neurological disorders) or blood disorders. In some cases, the cause may not be known. What increases the risk? The following factors may make you more likely to develop this condition:  Chronic hypertension. In some cases, this may not have been diagnosed before pregnancy.  Obesity.  Type 2 diabetes.  Kidney disease.  History of preeclampsia or eclampsia.  Other medical conditions that change the level of hormones in the body (hormonal imbalance). What are the signs or symptoms? As with all types of hypertension, postpartum hypertension may not have any symptoms. Depending on how high your blood pressure is, you may experience:  Headaches. These may be mild, moderate, or severe. They may also be steady, constant, or sudden in onset (thunderclap headache).  Changes in your ability to see (visual changes).  Dizziness.  Shortness of breath.  Swelling  of your hands, feet, lower legs, or face. In some cases, you may have swelling in more than one of these locations.  Heart palpitations or a racing heartbeat.  Difficulty breathing while lying down.  Decrease in the amount of urine that you pass. Other rare signs and symptoms may include:  Sweating more than usual. This lasts longer than a few days after delivery.  Chest pain.  Sudden dizziness when you get up from sitting or lying down.  Seizures.  Nausea or vomiting.  Abdominal pain. How is this diagnosed? This condition may be diagnosed based on the results of a physical exam, blood pressure measurements, and blood and urine tests. You may also have other tests, such as a CT scan or an MRI, to check for other problems of postpartum hypertension. How is this treated? If blood pressure is high enough to require treatment, your options may include:  Medicines to reduce blood pressure (antihypertensives). Tell your health care provider if you are breastfeeding or if you plan to breastfeed. There are many antihypertensive medicines that are safe to take while breastfeeding.  Stopping medicines that may be causing hypertension.  Treating medical conditions that are causing hypertension.  Treating the complications of hypertension, such as seizures, stroke, or kidney problems. Your health care provider will also continue to monitor your blood pressure closely until it is within a safe range for you. Follow these instructions at home:  Take over-the-counter and prescription medicines only as told by your health care provider.  Return to your normal activities as told by your health care provider. Ask your health care provider what activities are safe for you.  Do not use any products that contain nicotine or tobacco, such as cigarettes and e-cigarettes. If   you need help quitting, ask your health care provider.  Keep all follow-up visits as told by your health care provider. This  is important. Contact a health care provider if:  Your symptoms get worse.  You have new symptoms, such as: ? A headache that does not get better. ? Dizziness. ? Visual changes. Get help right away if:  You suddenly develop swelling in your hands, ankles, or face.  You have sudden, rapid weight gain.  You develop difficulty breathing, chest pain, racing heartbeat, or heart palpitations.  You develop severe pain in your abdomen.  You have any symptoms of a stroke. "BE FAST" is an easy way to remember the main warning signs of a stroke: ? B - Balance. Signs are dizziness, sudden trouble walking, or loss of balance. ? E - Eyes. Signs are trouble seeing or a sudden change in vision. ? F - Face. Signs are sudden weakness or numbness of the face, or the face or eyelid drooping on one side. ? A - Arms. Signs are weakness or numbness in an arm. This happens suddenly and usually on one side of the body. ? S - Speech. Signs are sudden trouble speaking, slurred speech, or trouble understanding what people say. ? T - Time. Time to call emergency services. Write down what time symptoms started.  You have other signs of a stroke, such as: ? A sudden, severe headache with no known cause. ? Nausea or vomiting. ? Seizure. These symptoms may represent a serious problem that is an emergency. Do not wait to see if the symptoms will go away. Get medical help right away. Call your local emergency services (911 in the [place] Do not drive yourself to the hospital. Summary  Postpartum hypertension is high blood pressure that remains higher than normal after childbirth.  In most cases, postpartum hypertension will go away on its own, usually within a week of delivery.  For some women, medical treatment is required to prevent serious complications, such as seizures or stroke. This information is not intended to replace advice given to you by your health care provider. Make sure you discuss any questions  you have with your health care provider. Document Revised: 11/01/2018 Document Reviewed: 07/16/2017 Elsevier Patient Education  2020 Elsevier Inc.  

## 2019-10-22 NOTE — MAU Note (Addendum)
Has foley cath due to nicked bladder during c/s. When pt awoke tonight her tube was separated from foley cath. She [place] unsure how it happened. Denies any pain or problems. Just needs the foley bag connected to catheter. Scheduled for cystogram this  Friday. Has not taken her b/p med today and smoked before coming into hospital.

## 2019-10-22 NOTE — MAU Note (Signed)
Spoke with Donia Ast NP regarding pt's admission. She will see pt in Triage. Pt aware. Sprite and crackers to pt

## 2019-10-22 NOTE — MAU Provider Note (Signed)
First Provider Initiated Contact with Patient 10/22/19 2052     S Ms. Olivia [name] is a 34 y.o. [phone] postpartum female who presents to MAU today with complaint of her Foley catheter falling out. Pt brought in catheter with her to MAU and appears to be intact except for the catheter being separated from the bag.   O BP (!) 148/97   Temp 98.5 F (36.9 C)   Resp 18   Breastfeeding No   Patient Vitals for the past 24 hrs:  BP Temp Resp  10/22/19 1951 (!) 148/97 -- --  10/22/19 1935 (!) 164/97 -- --  10/22/19 1934 -- 98.5 F (36.9 C) 18   Physical Exam  Constitutional: She is oriented to person, place, and time. She appears well-developed and well-nourished. No distress.  HENT:  Head: Normocephalic and atraumatic.  Respiratory: Effort normal.  Neurological: She is alert and oriented to person, place, and time.  Skin: She is not diaphoretic.  Psychiatric: She has a normal mood and affect. Her behavior is normal. Judgment and thought content normal.   A Non pregnant female, ppartum s/p C/S on 10/02/2019 Medical screening exam complete  P Consulted with Dr. Despina Hidden, pt does not need replacement catheter and can be discharged home with elevated pressures as patient reports she has not taken BP medication tonight as is her usual daily time and is asymptomatic with cHTN. Pt able to successful urinate in MAU without catheter. Pt strongly encouraged to take BP medication as soon as she gets home and to keep appt for tomorrow for BP check and cystoscopy appt on Friday 10/24/2019. Warning signs for worsening condition that would warrant emergency follow-up discussed Patient may return to MAU as needed for postpartum related complaints  Atticus Wedin, Odie Sera, NP 10/22/2019 9:13 PM

## 2019-10-23 ENCOUNTER — Other Ambulatory Visit: Payer: Medicaid Other

## 2019-10-23 ENCOUNTER — Ambulatory Visit: Payer: Medicaid Other

## 2019-10-24 ENCOUNTER — Other Ambulatory Visit: Payer: Self-pay

## 2019-10-24 ENCOUNTER — Ambulatory Visit (HOSPITAL_COMMUNITY)
Admission: RE | Admit: 2019-10-24 | Discharge: 2019-10-24 | Disposition: A | Discharge status: Home / Self Care | Payer: Medicaid Other | Source: Ambulatory Visit | Attending: Obstetrics and Gynecology | Admitting: Obstetrics and Gynecology

## 2019-10-24 DIAGNOSIS — Z435 Encounter for attention to cystostomy: Secondary | ICD-10-CM

## 2019-10-24 MED ORDER — IOTHALAMATE MEGLUMINE 17.2 % UR SOLN: 500.0000 mL | Freq: Once | URETHRAL | Status: DC | PRN

## 2019-10-27 ENCOUNTER — Other Ambulatory Visit: Payer: Medicaid Other

## 2019-10-29 ENCOUNTER — Other Ambulatory Visit: Payer: Medicaid Other

## 2019-10-29 ENCOUNTER — Other Ambulatory Visit: Payer: Self-pay

## 2019-10-29 ENCOUNTER — Ambulatory Visit (INDEPENDENT_AMBULATORY_CARE_PROVIDER_SITE_OTHER): Payer: Medicaid Other

## 2019-10-29 VITALS — BP 171/109 | HR 86 | Wt 160.0 lb

## 2019-10-29 DIAGNOSIS — T839XXA Unspecified complication of genitourinary prosthetic device, implant and graft, initial encounter: Secondary | ICD-10-CM

## 2019-10-29 DIAGNOSIS — E569 Vitamin deficiency, unspecified: Secondary | ICD-10-CM

## 2019-10-29 DIAGNOSIS — O165 Unspecified maternal hypertension, complicating the puerperium: Secondary | ICD-10-CM

## 2019-10-29 MED ORDER — PRENATE PIXIE 10-0.6-0.4-200 MG PO CAPS: 1.0000 | ORAL_CAPSULE | Freq: Every day | ORAL | 12 refills | Status: DC

## 2019-10-29 MED ORDER — AMLODIPINE BESYLATE 5 MG PO TABS: 5.0000 mg | ORAL_TABLET | Freq: Every day | ORAL | 2 refills | Status: DC

## 2019-10-29 MED ORDER — ENALAPRIL MALEATE 10 MG PO TABS: 10.0000 mg | ORAL_TABLET | Freq: Every day | ORAL | 0 refills | Status: DC

## 2019-10-29 NOTE — Progress Notes (Signed)
Pt presents to the clinic for bp check . Denies headache, dizziness, and blurred vision. Pt bp appears to high today in the clinic including 2 rechecks.  Pt reports taking her bp pills 10 mg qd.  She is requesting chantix today. She feels that her bp is high due to her smoking.  She reports smoking 2 cigarettes before her appointment today. Consulted with Dr. Clearance Coots and he suggest that another bp med be added to her current regiment and also for the pt to be seen in the ED for further evaluation due to elevated bp. Pt verbalized understanding. -EH/RMA

## 2019-11-02 LAB — URINE CULTURE

## 2019-11-03 ENCOUNTER — Encounter: Payer: Self-pay | Admitting: Obstetrics and Gynecology

## 2019-11-03 ENCOUNTER — Other Ambulatory Visit: Payer: Self-pay | Admitting: Obstetrics

## 2019-11-03 ENCOUNTER — Telehealth (INDEPENDENT_AMBULATORY_CARE_PROVIDER_SITE_OTHER): Payer: Medicaid Other | Admitting: Obstetrics and Gynecology

## 2019-11-03 DIAGNOSIS — Z1389 Encounter for screening for other disorder: Secondary | ICD-10-CM

## 2019-11-03 DIAGNOSIS — N390 Urinary tract infection, site not specified: Secondary | ICD-10-CM

## 2019-11-03 DIAGNOSIS — A499 Bacterial infection, unspecified: Secondary | ICD-10-CM

## 2019-11-03 MED ORDER — NITROFURANTOIN MONOHYD MACRO 100 MG PO CAPS: 100.0000 mg | ORAL_CAPSULE | Freq: Two times a day (BID) | ORAL | 2 refills | Status: DC

## 2019-11-03 NOTE — Progress Notes (Signed)
TELEHEALTH POSTPARTUM VIRTUAL VIDEO VISIT ENCOUNTER NOTE   Provider location: [facility] [place]   I connected with Olivia [name] on 11/03/19 at  4:00 PM EST by MyChart Video Encounter at home and verified that I am speaking with the correct person using two identifiers.    I discussed the limitations, risks, security and privacy concerns of performing an evaluation and management service virtually and the availability of in person appointments. I also discussed with the patient that there may be a patient responsible charge related to this service. The patient expressed understanding and agreed to proceed.  Chief Complaint: Postpartum Visit Olivia [name] is a 34 y.o. [phone] female who presents for a postpartum visit. She is 4 weeks postpartum following a low cervical transverse Cesarean section complicated by bladder injury. I have fully reviewed the prenatal and intrapartum course. The delivery was at 38.5 gestational weeks.  Anesthesia: spinal. Postpartum course has been uncomplicated. Baby's course has been doing well. Baby is feeding by bottle - Similac Neosure. Bleeding no bleeding. Bowel function is normal. Bladder function is normal. Patient is sexually active. Contraception method is tubal ligation. Postpartum depression screening:neg  Patient Active Problem List   Diagnosis Date Noted  . History of cystostomy 10/16/2019  . Chronic hypertension with superimposed pre-eclampsia 10/02/2019  . History of cesarean section, low transverse 10/02/2019  . H/O tubal ligation 10/02/2019  . Diabetes in pregnancy 07/19/2017  . History of cocaine abuse (HCC) 06/28/2017  . Tobacco use affecting pregnancy, antepartum 06/20/2017  . MDD (major depressive disorder), recurrent severe, 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/23/2016    Medications Olivia [name] had no medications administered during this visit. Current Outpatient Medications  Medication Sig Dispense Refill  .  amLODipine (NORVASC) 5 MG tablet Take 1 tablet (5 mg total) by mouth daily. 30 tablet 2  . enalapril (VASOTEC) 10 MG tablet Take 1 tablet (10 mg total) by mouth daily. 30 tablet 0  . nitrofurantoin, macrocrystal-monohydrate, (MACROBID) 100 MG capsule Take 1 capsule (100 mg total) by mouth 2 (two) times daily. 1 po BID x 7days 14 capsule 2  . oxyCODONE 10 MG TABS Take 1 tablet (10 mg total) by mouth every 4 (four) hours as needed for moderate pain or severe pain. 30 tablet 0  . Oxycodone HCl 10 MG TABS Take 1 tablet (10 mg total) by mouth every 6 (six) hours as needed. 10 tablet 0  . Prenat-FeAsp-Meth-FA-DHA w/o A (PRENATE PIXIE) 10-0.6-0.4-200 MG CAPS Take 1 tablet by mouth daily. 30 capsule 12  . senna-docusate (SENOKOT-S) 8.6-50 MG tablet Take 2 tablets by mouth daily. 30 tablet 2   No current facility-administered medications for this visit.    Allergies Ibuprofen  Physical Exam:  There were no vitals taken for this visit.  General:  Alert, oriented and cooperative. Patient is in no acute distress.  Mental Status: Normal mood and affect. Normal behavior. Normal judgment and thought content.   Respiratory: Normal respiratory effort noted, no problems with respiration noted  Rest of physical exam deferred due to type of encounter  PP Depression Screening:   Edinburgh Postnatal Depression Scale Screening Tool 10/16/2019 01/14/2018 12/07/2017  I have been able to laugh and see the funny side of things. 0 0 0  I have looked forward with enjoyment to things. 0 0 0  I have blamed myself unnecessarily when things went wrong. 0 0 0  I have been anxious or worried for no good reason. 0 0 0  I have felt scared or panicky for no good reason. 0 0 0  Things have been getting on top of me. 0 0 0  I have been so unhappy that I have had difficulty sleeping. 0 0 0  I have felt sad or miserable. 0 0 0  I have been so unhappy that I have been crying. 0 0 0  The thought of harming myself has occurred to me.  0 0 0  Edinburgh Postnatal Depression Scale Total 0 0 0     Assessment:Patient is a 34 y.o. [id number] who is 4 weeks postpartum from a repeat cesarean section and tubal ligation was performed.  She is doing well.   Plan: Patient had a normal cytogram on 10/24/19 Patient is medically cleared to resume all activities of daily living Patient is unable to take her blood pressure at this moment but plans to call us if it is elevated.  Patient needs to return for pp glucola in 2 weeks  I discussed the assessment and treatment plan with the patient. The patient was provided an opportunity to ask questions and all were answered. The patient agreed with the plan and demonstrated an understanding of the instructions.   The patient was advised to call back or seek an in-person evaluation/go to the ED for any concerning postpartum symptoms.  I provided 15 minutes of face-to-face time during this encounter.   Mora Bellman, MD [facility] [place]

## 2019-12-10 ENCOUNTER — Other Ambulatory Visit: Payer: Self-pay | Admitting: Obstetrics

## 2019-12-10 DIAGNOSIS — O165 Unspecified maternal hypertension, complicating the puerperium: Secondary | ICD-10-CM

## 2019-12-16 ENCOUNTER — Telehealth: Payer: Self-pay

## 2019-12-16 NOTE — Telephone Encounter (Signed)
Returned call, no answer, vm is not setup 

## 2019-12-17 ENCOUNTER — Telehealth: Payer: Self-pay

## 2019-12-17 MED ORDER — FLUCONAZOLE 150 MG PO TABS: 150.0000 mg | ORAL_TABLET | Freq: Once | ORAL | 0 refills | Status: AC

## 2019-12-17 NOTE — Telephone Encounter (Signed)
Returned call, pt reports vaginal itching and white discharge, requests diflucan, sent per protocol.

## 2020-02-06 ENCOUNTER — Other Ambulatory Visit: Payer: Self-pay

## 2020-02-06 DIAGNOSIS — Z20822 Contact with and (suspected) exposure to covid-19: Secondary | ICD-10-CM

## 2020-02-16 ENCOUNTER — Other Ambulatory Visit: Payer: Self-pay | Admitting: Obstetrics

## 2020-02-16 ENCOUNTER — Other Ambulatory Visit: Payer: Self-pay

## 2020-02-16 DIAGNOSIS — O165 Unspecified maternal hypertension, complicating the puerperium: Secondary | ICD-10-CM

## 2020-02-16 MED ORDER — FLUCONAZOLE 150 MG PO TABS: 150.0000 mg | ORAL_TABLET | Freq: Once | ORAL | 0 refills | Status: DC

## 2020-02-16 NOTE — Progress Notes (Signed)
Diflucan rx sent to pts preferred pharmacy per protocol for patient with yeast infection symptoms of white vaginal discharge and itching. Pt made aware and voices understanding.

## 2020-02-26 ENCOUNTER — Telehealth: Payer: Self-pay

## 2020-02-26 NOTE — Telephone Encounter (Signed)
Returned call, pt would like an appt to leave a urine sample, advised scheduler will call.

## 2020-03-17 ENCOUNTER — Other Ambulatory Visit: Payer: Self-pay | Admitting: Obstetrics

## 2020-03-17 DIAGNOSIS — O165 Unspecified maternal hypertension, complicating the puerperium: Secondary | ICD-10-CM

## 2020-04-13 ENCOUNTER — Emergency Department (HOSPITAL_BASED_OUTPATIENT_CLINIC_OR_DEPARTMENT_OTHER)
Admission: EM | Admit: 2020-04-13 | Discharge: 2020-04-13 | Disposition: A | Discharge status: Home / Self Care | Payer: No Typology Code available for payment source | Attending: Emergency Medicine | Admitting: Emergency Medicine

## 2020-04-13 ENCOUNTER — Other Ambulatory Visit: Payer: Self-pay

## 2020-04-13 ENCOUNTER — Encounter (HOSPITAL_BASED_OUTPATIENT_CLINIC_OR_DEPARTMENT_OTHER): Payer: Self-pay | Admitting: *Deleted

## 2020-04-13 DIAGNOSIS — Z79899 Other long term (current) drug therapy: Secondary | ICD-10-CM | POA: Insufficient documentation

## 2020-04-13 DIAGNOSIS — I10 Essential (primary) hypertension: Secondary | ICD-10-CM | POA: Insufficient documentation

## 2020-04-13 DIAGNOSIS — F17210 Nicotine dependence, cigarettes, uncomplicated: Secondary | ICD-10-CM | POA: Insufficient documentation

## 2020-04-13 DIAGNOSIS — Y92410 Unspecified street and highway as the place of occurrence of the external cause: Secondary | ICD-10-CM | POA: Diagnosis not present

## 2020-04-13 DIAGNOSIS — Y999 Unspecified external cause status: Secondary | ICD-10-CM | POA: Diagnosis not present

## 2020-04-13 DIAGNOSIS — Y939 Activity, unspecified: Secondary | ICD-10-CM | POA: Diagnosis not present

## 2020-04-13 DIAGNOSIS — M546 Pain in thoracic spine: Secondary | ICD-10-CM | POA: Insufficient documentation

## 2020-04-13 MED ORDER — ACETAMINOPHEN 500 MG PO TABS: 500.0000 mg | ORAL_TABLET | Freq: Four times a day (QID) | ORAL | 0 refills | Status: AC | PRN

## 2020-04-13 MED ORDER — METHOCARBAMOL 500 MG PO TABS: 500.0000 mg | ORAL_TABLET | Freq: Two times a day (BID) | ORAL | 0 refills | Status: DC
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
## 2020-04-13 NOTE — ED Notes (Signed)
Patient is ambulatory carrying her infant from the lobby to the room.

## 2020-04-15 ENCOUNTER — Ambulatory Visit
Admission: EM | Admit: 2020-04-15 | Discharge: 2020-04-15 | Disposition: A | Discharge status: Home / Self Care | Payer: Medicaid Other | Attending: Emergency Medicine | Admitting: Emergency Medicine

## 2020-04-15 ENCOUNTER — Encounter: Payer: Self-pay | Admitting: Emergency Medicine

## 2020-04-15 DIAGNOSIS — G44209 Tension-type headache, unspecified, not intractable: Secondary | ICD-10-CM

## 2020-04-15 MED ORDER — METOCLOPRAMIDE HCL 5 MG/ML IJ SOLN: 5.0000 mg | Freq: Once | INTRAMUSCULAR | Status: DC

## 2020-04-15 MED ORDER — DEXAMETHASONE SODIUM PHOSPHATE 10 MG/ML IJ SOLN
10.0000 mg | Freq: Once | INTRAMUSCULAR | Status: AC
  Administered 2020-04-15: 10 mg via INTRAMUSCULAR

## 2020-04-15 MED ORDER — KETOROLAC TROMETHAMINE 30 MG/ML IJ SOLN
30.0000 mg | Freq: Once | INTRAMUSCULAR | Status: AC
  Administered 2020-04-15: 30 mg via INTRAVENOUS

## 2020-04-15 MED ORDER — ONDANSETRON 4 MG PO TBDP
4.0000 mg | ORAL_TABLET | Freq: Once | ORAL | Status: AC
  Administered 2020-04-15: 4 mg via ORAL
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
## 2020-04-15 NOTE — Discharge Instructions (Signed)
You are given medications today for your headache. If this returns, or worsens later today recommend you go to ER for further evaluation. Recommend that you continue anti-inflammatory medications and muscle relaxers given at hospital as this will help with muscle tension and headaches.
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
## 2020-04-16 ENCOUNTER — Other Ambulatory Visit: Payer: Self-pay | Admitting: Obstetrics

## 2020-04-16 DIAGNOSIS — O165 Unspecified maternal hypertension, complicating the puerperium: Secondary | ICD-10-CM

## 2020-04-16 DIAGNOSIS — N390 Urinary tract infection, site not specified: Secondary | ICD-10-CM

## 2020-05-10 ENCOUNTER — Ambulatory Visit
Admission: EM | Admit: 2020-05-10 | Discharge: 2020-05-10 | Disposition: A | Discharge status: Home / Self Care | Payer: Medicaid Other | Attending: Emergency Medicine | Admitting: Emergency Medicine

## 2020-05-10 DIAGNOSIS — Z7251 High risk heterosexual behavior: Secondary | ICD-10-CM | POA: Diagnosis not present

## 2020-05-10 DIAGNOSIS — Z113 Encounter for screening for infections with a predominantly sexual mode of transmission: Secondary | ICD-10-CM | POA: Insufficient documentation

## 2020-05-10 MED ORDER — DOXYCYCLINE HYCLATE 100 MG PO CAPS: 100.0000 mg | ORAL_CAPSULE | Freq: Two times a day (BID) | ORAL | 0 refills | Status: AC

## 2020-05-10 MED ORDER — CEFTRIAXONE SODIUM 500 MG IJ SOLR
500.0000 mg | Freq: Once | INTRAMUSCULAR | Status: AC
  Administered 2020-05-10: 500 mg via INTRAMUSCULAR

## 2020-05-10 NOTE — ED Triage Notes (Signed)
Pt states had a positive exposure to chylamdia last night. Denies vaginal discharge. States had a UTI and complete antibiotics 4 days.

## 2020-05-10 NOTE — Discharge Instructions (Signed)
Today you received treatment for chlamydia & gonorrhea. Testing for chlamydia, gonorrhea, trichomonas is pending: please look for these results on the MyChart app/website.  We will notify you if you are positive and outline treatment at that time.  Important to avoid all forms of sexual intercourse (oral, vaginal, anal) with any/all partners for the next 7 days to avoid spreading/reinfecting. Any/all sexual partners should be notified of testing/treatment today.  Return for persistent/worsening symptoms or if you develop fever, abdominal or pelvic pain, discharge, genital pain, blood in your urine, or are re-exposed to an STI.

## 2020-05-10 NOTE — ED Provider Notes (Signed)
[facility]    CSN: [id number] Arrival date & time: 05/10/20  5885      History   Chief Complaint Chief Complaint  Patient presents with  . SEXUALLY TRANSMITTED DISEASE    HPI Olivia [name] is a 34 y.o. female presenting for possible chlamydia exposure.  States her boyfriend told her last night.  Penile discharge, but was chlamydia, though did not receive formal diagnosis.  Underwent testing yesterday: Not resulted yet.  Denies vaginal discharge, pelvic pain, dental pain.  States that she was recently treated for UTI: Complete her antibiotics 4 days ago and doing well.    Past Medical History:  Diagnosis Date  . Anxiety   . Bipolar 1 disorder (HCC)   . Chronic hypertension during pregnancy, antepartum 11/07/2017  . Cocaine abuse (HCC)   . Elevated hemoglobin A1c 06/25/2017  . Gestational diabetes   . Hypertension   . Low vitamin D level 06/25/2017  . Migraine   . Obesity affecting pregnancy in second trimester 06/20/2017  . Ovarian cyst   . PROM (premature rupture of membranes) 12/05/2017  . PTSD (post-traumatic stress disorder)   . Rh negative state in antepartum period 06/25/2017  . Supervision of high risk pregnancy, antepartum 12/03/2014    Clinic [facility] Prenatal Labs Dating LMP Blood type: O/Negative/-- (09/12 1132)  Genetic Screen 1 Screen:    AFP:     Quad:     NIPS:Mat21; normal Antibody:Negative (09/12 1132) Anatomic [place] Normal female fetus at 19 wks Rubella: 1.58 (09/12 1132) GTT A1C: prediabetes, Early:               Third trimester:  RPR: Non Reactive (12/18 1403)  Flu vaccine  08/16/17 HBsAg: Negative (09/12 1132)  TDaP vacci    Patient Active Problem List   Diagnosis Date Noted  . History of cystostomy 10/16/2019  . Chronic hypertension with superimposed pre-eclampsia 10/02/2019  . History of cesarean section, low transverse 10/02/2019  . H/O tubal ligation 10/02/2019  . Diabetes in pregnancy 07/19/2017  . History of cocaine abuse (HCC) 06/28/2017    . Tobacco use affecting pregnancy, antepartum 06/20/2017  . MDD (major depressive disorder), recurrent severe, 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/23/2016    Past Surgical History:  Procedure Laterality Date  . CESAREAN SECTION N/A 12/06/2017   Procedure: CESAREAN SECTION;  Surgeon: Tilda Burrow, MD;  Location: [facility] BIRTHING SUITES;  Service: Obstetrics;  Laterality: N/A;  . CESAREAN SECTION N/A 10/02/2019   Procedure: CESAREAN SECTION;  Surgeon: Tilda Burrow, MD;  Location: [facility] LD ORS;  Service: Obstetrics;  Laterality: N/A;  . TUBAL LIGATION    . WISDOM TOOTH EXTRACTION      OB History    Gravida  4   Para  2   Term  2   Preterm      AB  2   Living  2     SAB  1   TAB  1   Ectopic      Multiple  0   Live Births  2            Home Medications    Prior to Admission medications   Medication Sig Start Date End Date Taking? Authorizing Provider  acetaminophen (TYLENOL) 500 MG tablet Take 1 tablet (500 mg total) by mouth every 6 (six) hours as needed. 04/13/20   Hyman Hopes, Margaux, PA-C  amLODipine (NORVASC) 5 MG tablet TAKE 1 TABLET BY MOUTH EVERY DAY 04/16/20   Brock Bad, MD  enalapril (VASOTEC) 10 MG tablet TAKE 1 TABLET BY MOUTH EVERY DAY 04/16/20   Brock Bad, MD  fluconazole (DIFLUCAN) 150 MG tablet TAKE 1 TABLET BY MOUTH ONCE FOR 1 DOSE. CAN TAKE ADDITIONAL DOSE 3 DAYS LATER IF SYMPTOMS PERSIST 04/16/20   Brock Bad, MD  methocarbamol (ROBAXIN) 500 MG tablet Take 1 tablet (500 mg total) by mouth 2 (two) times daily. 04/13/20   Venter, Margaux, PA-C  Prenat-FeAsp-Meth-FA-DHA w/o A (PRENATE PIXIE) 10-0.6-0.4-200 MG CAPS Take 1 tablet by mouth daily. 10/29/19   Brock Bad, MD    Family History Family History  Problem Relation Age of Onset  . Hypertension Mother   . Migraines Mother   . Anxiety disorder Mother   . Depression Mother   . Seizures Mother   . Hypertension Father   . Cancer Father        prostate  . Diabetes Father   . Anxiety  disorder Father   . Bipolar disorder Father   . Depression Father   . OCD Father   . Heart disease Maternal Grandmother   . Heart attack Maternal Grandmother 78  . ADD / ADHD Brother   . Schizophrenia Maternal Uncle     Social History Social History   Tobacco Use  . Smoking status: Current Every Day Smoker    Packs/day: 1.00    Types: Cigarettes  . Smokeless tobacco: Never Used  . Tobacco comment: 5 cigarettes per day (pregnant)  Vaping Use  . Vaping Use: Some days  Substance Use Topics  . Alcohol use: Not Currently    Comment: socially  . Drug use: Not Currently    Types: Cocaine    Comment: not used in a year     Allergies   Ibuprofen   Review of Systems As per HPI   Physical Exam Triage Vital Signs ED Triage Vitals  Enc Vitals Group     BP 05/10/20 0953 (!) 149/90     Pulse Rate 05/10/20 0953 84     Resp 05/10/20 0953 18     Temp 05/10/20 0953 98.3 F (36.8 C)     Temp Source 05/10/20 0953 Oral     SpO2 05/10/20 0953 98 %     Weight --      Height --      Head Circumference --      Peak Flow --      Pain Score 05/10/20 1004 0     Pain Loc --      Pain Edu? --      Excl. in GC? --    No data found.  Updated Vital Signs BP (!) 149/90 (BP Location: Left Arm)   Pulse 84   Temp 98.3 F (36.8 C) (Oral)   Resp 18   LMP 04/23/2020   SpO2 98%   Breastfeeding No   Visual Acuity Right Eye Distance:   Left Eye Distance:   Bilateral Distance:    Right Eye Near:   Left Eye Near:    Bilateral Near:     Physical Exam Constitutional:      General: She is not in acute distress. HENT:     Head: Normocephalic and atraumatic.  Eyes:     General: No scleral icterus.    Pupils: Pupils are equal, round, and reactive to light.  Cardiovascular:     Rate and Rhythm: Normal rate.  Pulmonary:     Effort: Pulmonary effort is normal.  Abdominal:     General:  Bowel sounds are normal.     Palpations: Abdomen is soft.     Tenderness: There is no  abdominal tenderness. There is no right CVA tenderness, left CVA tenderness or guarding.  Genitourinary:    Comments: Patient declined, self-swab performed Skin:    Coloration: Skin is not jaundiced or pale.  Neurological:     Mental Status: She is alert and oriented to person, place, and time.      UC Treatments / Results  Labs (all labs ordered are listed, but only abnormal results are displayed) Labs Reviewed  CERVICOVAGINAL ANCILLARY ONLY    EKG   Radiology No results found.  Procedures Procedures (including critical care time)  Medications Ordered in UC Medications  cefTRIAXone (ROCEPHIN) injection 500 mg (has no administration in time range)    Initial Impression / Assessment and Plan / UC Course  I have reviewed the triage vital signs and the nursing notes.  Pertinent labs & imaging results that were available during my care of the patient were reviewed by me and considered in my medical decision making (see chart for details).     Patient appears well in office today.  Cytology pending: We will cover for G/C in the interim at patient's request.  Return precautions discussed, pt verbalized understanding and is agreeable to plan. Final Clinical Impressions(s) / UC Diagnoses   Final diagnoses:  Unprotected sex  Encntr screen for infections w sexl mode of transmiss     Discharge Instructions     Today you received treatment for chlamydia & gonorrhea. Testing for chlamydia, gonorrhea, trichomonas is pending: please look for these results on the MyChart app/website.  We will notify you if you are positive and outline treatment at that time.  Important to avoid all forms of sexual intercourse (oral, vaginal, anal) with any/all partners for the next 7 days to avoid spreading/reinfecting. Any/all sexual partners should be notified of testing/treatment today.  Return for persistent/worsening symptoms or if you develop fever, abdominal or pelvic pain, discharge,  genital pain, blood in your urine, or are re-exposed to an STI.    ED Prescriptions    None     PDMP not reviewed this encounter.   Hall-Potvin, [place], [place] 05/10/20 1031

## 2020-05-24 ENCOUNTER — Ambulatory Visit
Admission: EM | Admit: 2020-05-24 | Discharge: 2020-05-24 | Disposition: A | Discharge status: Home / Self Care | Payer: Medicaid Other | Attending: Physician Assistant | Admitting: Physician Assistant

## 2020-05-24 DIAGNOSIS — R300 Dysuria: Secondary | ICD-10-CM | POA: Diagnosis not present

## 2020-05-24 DIAGNOSIS — K5900 Constipation, unspecified: Secondary | ICD-10-CM | POA: Diagnosis present

## 2020-05-24 LAB — POCT URINALYSIS DIP (MANUAL ENTRY)
Bilirubin, UA: NEGATIVE
Blood, UA: NEGATIVE
Glucose, UA: NEGATIVE mg/dL
Leukocytes, UA: NEGATIVE
Nitrite, UA: NEGATIVE
Protein Ur, POC: NEGATIVE mg/dL
Spec Grav, UA: 1.025 (ref 1.010–1.025)
Urobilinogen, UA: 0.2 E.U./dL
pH, UA: 6 (ref 5.0–8.0)

## 2020-05-24 NOTE — Discharge Instructions (Signed)
Your urine was negative for infection. Keep hydrated, urine should be clear to pale yellow in color.  Can use over-the-counter MiraLAX for constipation. Cytology sent, you will be contacted with any positive results that requires further treatment. Monitor for any worsening of symptoms, fever, abdominal pain, nausea, vomiting, to follow up for reevaluation.

## 2020-05-24 NOTE — ED Triage Notes (Signed)
Pt c/o burning on urination with lower abdominal/back pain x2 days. Denies un projective intercourse since the last time she was tested.

## 2020-05-24 NOTE — ED Provider Notes (Signed)
[facility]    CSN: [id number] Arrival date & time: 05/24/20  1320      History   Chief Complaint Chief Complaint  Patient presents with  . Urinary Tract Infection    HPI Olivia [name] is a 34 y.o. female.   34 year old female comes in for 2 day history of urinary symptoms. Dysuria, low abdominal pain/back pain, urinary frequency. Denies hematuria.  Denies nausea, vomiting. Has had constipation. Denies fever, chills. Denies vaginal discharge, itching, spotting. LMP 04/23/2020.  S/p tubal ligation.     Past Medical History:  Diagnosis Date  . Anxiety   . Bipolar 1 disorder (HCC)   . Chronic hypertension during pregnancy, antepartum 11/07/2017  . Cocaine abuse (HCC)   . Elevated hemoglobin A1c 06/25/2017  . Gestational diabetes   . Hypertension   . Low vitamin D level 06/25/2017  . Migraine   . Obesity affecting pregnancy in second trimester 06/20/2017  . Ovarian cyst   . PROM (premature rupture of membranes) 12/05/2017  . PTSD (post-traumatic stress disorder)   . Rh negative state in antepartum period 06/25/2017  . Supervision of high risk pregnancy, antepartum 12/03/2014    Clinic [facility] Prenatal Labs Dating LMP Blood type: O/Negative/-- (09/12 1132)  Genetic Screen 1 Screen:    AFP:     Quad:     NIPS:Mat21; normal Antibody:Negative (09/12 1132) Anatomic [place] Normal female fetus at 19 wks Rubella: 1.58 (09/12 1132) GTT A1C: prediabetes, Early:               Third trimester:  RPR: Non Reactive (12/18 1403)  Flu vaccine  08/16/17 HBsAg: Negative (09/12 1132)  TDaP vacci    Patient Active Problem List   Diagnosis Date Noted  . History of cystostomy 10/16/2019  . Chronic hypertension with superimposed pre-eclampsia 10/02/2019  . History of cesarean section, low transverse 10/02/2019  . H/O tubal ligation 10/02/2019  . Diabetes in pregnancy 07/19/2017  . History of cocaine abuse (HCC) 06/28/2017  . Tobacco use affecting pregnancy, antepartum 06/20/2017  . MDD  (major depressive disorder), recurrent severe, 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/23/2016    Past Surgical History:  Procedure Laterality Date  . CESAREAN SECTION N/A 12/06/2017   Procedure: CESAREAN SECTION;  Surgeon: Tilda Burrow, MD;  Location: [facility] BIRTHING SUITES;  Service: Obstetrics;  Laterality: N/A;  . CESAREAN SECTION N/A 10/02/2019   Procedure: CESAREAN SECTION;  Surgeon: Tilda Burrow, MD;  Location: [facility] LD ORS;  Service: Obstetrics;  Laterality: N/A;  . TUBAL LIGATION    . WISDOM TOOTH EXTRACTION      OB History    Gravida  4   Para  2   Term  2   Preterm      AB  2   Living  2     SAB  1   TAB  1   Ectopic      Multiple  0   Live Births  2            Home Medications    Prior to Admission medications   Medication Sig Start Date End Date Taking? Authorizing Provider  acetaminophen (TYLENOL) 500 MG tablet Take 1 tablet (500 mg total) by mouth every 6 (six) hours as needed. 04/13/20   Hyman Hopes, Margaux, PA-C  amLODipine (NORVASC) 5 MG tablet TAKE 1 TABLET BY MOUTH EVERY DAY 04/16/20   Brock Bad, MD  enalapril (VASOTEC) 10 MG tablet TAKE 1 TABLET BY MOUTH EVERY DAY 04/16/20  Brock Bad, MD    Family History Family History  Problem Relation Age of Onset  . Hypertension Mother   . Migraines Mother   . Anxiety disorder Mother   . Depression Mother   . Seizures Mother   . Hypertension Father   . Cancer Father        prostate  . Diabetes Father   . Anxiety disorder Father   . Bipolar disorder Father   . Depression Father   . OCD Father   . Heart disease Maternal Grandmother   . Heart attack Maternal Grandmother 78  . ADD / ADHD Brother   . Schizophrenia Maternal Uncle     Social History Social History   Tobacco Use  . Smoking status: Current Every Day Smoker    Packs/day: 1.00    Types: Cigarettes  . Smokeless tobacco: Never Used  . Tobacco comment: 5 cigarettes per day (pregnant)  Vaping Use  . Vaping Use: Some days    Substance Use Topics  . Alcohol use: Not Currently    Comment: socially  . Drug use: Not Currently    Types: Cocaine    Comment: not used in a year     Allergies   Ibuprofen   Review of Systems Review of Systems  Reason unable to perform ROS: See HPI as above.     Physical Exam Triage Vital Signs ED Triage Vitals  Enc Vitals Group     BP 05/24/20 1423 128/73     Pulse Rate 05/24/20 1423 77     Resp 05/24/20 1423 18     Temp 05/24/20 1423 97.9 F (36.6 C)     Temp Source 05/24/20 1423 Oral     SpO2 05/24/20 1423 97 %     Weight --      Height --      Head Circumference --      Peak Flow --      Pain Score 05/24/20 1500 0     Pain Loc --      Pain Edu? --      Excl. in GC? --    No data found.  Updated Vital Signs BP 128/73 (BP Location: Left Arm)   Pulse 86   Temp (!) 97.4 F (36.3 C) (Oral)   Resp 18   SpO2 99%   Breastfeeding No   Physical Exam Constitutional:      General: She is not in acute distress.    Appearance: She is well-developed. She is not ill-appearing, toxic-appearing or diaphoretic.  HENT:     Head: Normocephalic and atraumatic.  Eyes:     Conjunctiva/sclera: Conjunctivae normal.     Pupils: Pupils are equal, round, and reactive to light.  Cardiovascular:     Rate and Rhythm: Normal rate and regular rhythm.  Pulmonary:     Effort: Pulmonary effort is normal. No respiratory distress.     Comments: LCTAB Abdominal:     General: Bowel sounds are normal.     Palpations: Abdomen is soft.     Tenderness: There is no abdominal tenderness. There is no right CVA tenderness, left CVA tenderness, guarding or rebound.  Musculoskeletal:     Cervical back: Normal range of motion and neck supple.  Skin:    General: Skin is warm and dry.  Neurological:     Mental Status: She is alert and oriented to person, place, and time.  Psychiatric:        Behavior: Behavior normal.  Judgment: Judgment normal.      UC Treatments / Results   Labs (all labs ordered are listed, but only abnormal results are displayed) Labs Reviewed  POCT URINALYSIS DIP (MANUAL ENTRY) - Abnormal; Notable for the following components:      Result Value   Ketones, POC UA trace (5) (*)    All other components within normal limits  CERVICOVAGINAL ANCILLARY ONLY    EKG   Radiology No results found.  Procedures Procedures (including critical care time)  Medications Ordered in UC Medications - No data to display  Initial Impression / Assessment and Plan / UC Course  I have reviewed the triage vital signs and the nursing notes.  Pertinent labs & imaging results that were available during my care of the patient were reviewed by me and considered in my medical decision making (see chart for details).    Urine dipstick negative for leukocytes, nitrites, blood.  Increase fluid intake. miralax as needed for constipation. Cytology sent.  Return precautions given.  Final Clinical Impressions(s) / UC Diagnoses   Final diagnoses:  Dysuria  Constipation, unspecified constipation type    ED Prescriptions    None     PDMP not reviewed this encounter.   Belinda Fisher, PA-C 05/24/20 [phone]

## 2020-05-26 LAB — CERVICOVAGINAL ANCILLARY ONLY
Chlamydia: NEGATIVE
Comment: NEGATIVE
Comment: NEGATIVE
Comment: NORMAL
Neisseria Gonorrhea: NEGATIVE
Trichomonas: POSITIVE — AB

## 2020-05-27 ENCOUNTER — Telehealth (HOSPITAL_COMMUNITY): Payer: Self-pay

## 2020-05-27 DIAGNOSIS — A599 Trichomoniasis, unspecified: Secondary | ICD-10-CM

## 2020-05-27 MED ORDER — METRONIDAZOLE 500 MG PO TABS: 500.0000 mg | ORAL_TABLET | Freq: Two times a day (BID) | ORAL | 0 refills | Status: DC

## 2020-05-27 NOTE — Telephone Encounter (Signed)
Patient contacted by phone and made aware of    results. Pt verbalized understanding and had all questions answered.  Trichomonas is positive.  Any sexual partners need to be notified so that they can be tested and treated.  Flagyl sent to pharmacy of choice.  Refrain from intercourse for 7 days to allow the medication time to work.

## 2020-07-11 ENCOUNTER — Encounter (HOSPITAL_BASED_OUTPATIENT_CLINIC_OR_DEPARTMENT_OTHER): Payer: Self-pay | Admitting: *Deleted

## 2020-07-11 ENCOUNTER — Emergency Department (HOSPITAL_BASED_OUTPATIENT_CLINIC_OR_DEPARTMENT_OTHER)
Admission: EM | Admit: 2020-07-11 | Discharge: 2020-07-12 | Disposition: A | Discharge status: Home / Self Care | Payer: Medicaid Other | Attending: Emergency Medicine | Admitting: Emergency Medicine

## 2020-07-11 ENCOUNTER — Other Ambulatory Visit: Payer: Self-pay

## 2020-07-11 DIAGNOSIS — F17210 Nicotine dependence, cigarettes, uncomplicated: Secondary | ICD-10-CM | POA: Diagnosis not present

## 2020-07-11 DIAGNOSIS — Z711 Person with feared health complaint in whom no diagnosis is made: Secondary | ICD-10-CM

## 2020-07-11 DIAGNOSIS — Z79899 Other long term (current) drug therapy: Secondary | ICD-10-CM | POA: Diagnosis not present

## 2020-07-11 DIAGNOSIS — Z202 Contact with and (suspected) exposure to infections with a predominantly sexual mode of transmission: Secondary | ICD-10-CM | POA: Insufficient documentation

## 2020-07-11 DIAGNOSIS — I10 Essential (primary) hypertension: Secondary | ICD-10-CM | POA: Diagnosis not present

## 2020-07-11 DIAGNOSIS — N898 Other specified noninflammatory disorders of vagina: Secondary | ICD-10-CM | POA: Diagnosis present

## 2020-07-11 LAB — URINALYSIS, ROUTINE W REFLEX MICROSCOPIC
Bilirubin Urine: NEGATIVE
Glucose, UA: NEGATIVE mg/dL
Hgb urine dipstick: NEGATIVE
Ketones, ur: NEGATIVE mg/dL
Leukocytes,Ua: NEGATIVE
Nitrite: NEGATIVE
Protein, ur: NEGATIVE mg/dL
Specific Gravity, Urine: 1.02 (ref 1.005–1.030)
pH: 7 (ref 5.0–8.0)

## 2020-07-11 LAB — PREGNANCY, URINE: Preg Test, Ur: NEGATIVE

## 2020-07-11 NOTE — ED Triage Notes (Signed)
Pt reports low back pain and vaginal discharge x 3 days. Reports recent unprotected sex

## 2020-07-12 LAB — GC/CHLAMYDIA PROBE AMP (~~LOC~~) NOT AT ARMC
Chlamydia: NEGATIVE
Comment: NEGATIVE
Comment: NORMAL
Neisseria Gonorrhea: NEGATIVE

## 2020-07-12 MED ORDER — DOXYCYCLINE HYCLATE 100 MG PO CAPS
100.0000 mg | ORAL_CAPSULE | Freq: Two times a day (BID) | ORAL | 0 refills | Status: DC
Start: 2020-07-12 — End: 2021-03-31

## 2020-07-12 MED ORDER — LIDOCAINE HCL (PF) 1 % IJ SOLN
INTRAMUSCULAR | Status: AC
  Administered 2020-07-12: 1 mL
  Filled 2020-07-12: qty 5

## 2020-07-12 MED ORDER — CEFTRIAXONE SODIUM 500 MG IJ SOLR
500.0000 mg | Freq: Once | INTRAMUSCULAR | Status: AC
  Administered 2020-07-12: 500 mg via INTRAMUSCULAR
  Filled 2020-07-12: qty 500

## 2020-07-12 MED ORDER — DOXYCYCLINE HYCLATE 100 MG PO TABS
100.0000 mg | ORAL_TABLET | Freq: Once | ORAL | Status: AC
  Administered 2020-07-12: 100 mg via ORAL
  Filled 2020-07-12: qty 1

## 2020-07-12 NOTE — ED Provider Notes (Signed)
[facility] EMERGENCY DEPARTMENT Provider Note   CSN: [id number] Arrival date & time: 07/11/20  1936     History Chief Complaint  Patient presents with  . Back Pain    vaginal discharge    Olivia [name] is a 34 y.o. female.  HPI     This a 34 year old female with a history of bipolar disorder, hypertension who presents with concern for STD.  Patient reports that she had unprotected sex with a partner.  Since that time she has had some white vaginal discharge.  She denies pelvic pain or discomfort.  She denies any dysuria or hematuria.  She "just wants to make sure I am okay."  Denies fevers.  She wishes to be tested and treated for STDs.  Does not believe herself to be pregnant.  Past Medical History:  Diagnosis Date  . Anxiety   . Bipolar 1 disorder (HCC)   . Chronic hypertension during pregnancy, antepartum 11/07/2017  . Cocaine abuse (HCC)   . Elevated hemoglobin A1c 06/25/2017  . Gestational diabetes   . Hypertension   . Low vitamin D level 06/25/2017  . Migraine   . Obesity affecting pregnancy in second trimester 06/20/2017  . Ovarian cyst   . PROM (premature rupture of membranes) 12/05/2017  . PTSD (post-traumatic stress disorder)   . Rh negative state in antepartum period 06/25/2017  . Supervision of high risk pregnancy, antepartum 12/03/2014    Clinic [facility] Prenatal Labs Dating LMP Blood type: O/Negative/-- (09/12 1132)  Genetic Screen 1 Screen:    AFP:     Quad:     NIPS:Mat21; normal Antibody:Negative (09/12 1132) Anatomic [place] Normal female fetus at 19 wks Rubella: 1.58 (09/12 1132) GTT A1C: prediabetes, Early:               Third trimester:  RPR: Non Reactive (12/18 1403)  Flu vaccine  08/16/17 HBsAg: Negative (09/12 1132)  TDaP vacci    Patient Active Problem List   Diagnosis Date Noted  . History of cystostomy 10/16/2019  . Chronic hypertension with superimposed pre-eclampsia 10/02/2019  . History of cesarean section, low transverse 10/02/2019  . H/O  tubal ligation 10/02/2019  . Diabetes in pregnancy 07/19/2017  . History of cocaine abuse (HCC) 06/28/2017  . Tobacco use affecting pregnancy, antepartum 06/20/2017  . MDD (major depressive disorder), recurrent severe, 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/23/2016    Past Surgical History:  Procedure Laterality Date  . CESAREAN SECTION N/A 12/06/2017   Procedure: CESAREAN SECTION;  Surgeon: Tilda Burrow, MD;  Location: [facility] BIRTHING SUITES;  Service: Obstetrics;  Laterality: N/A;  . CESAREAN SECTION N/A 10/02/2019   Procedure: CESAREAN SECTION;  Surgeon: Tilda Burrow, MD;  Location: [facility] LD ORS;  Service: Obstetrics;  Laterality: N/A;  . TUBAL LIGATION    . WISDOM TOOTH EXTRACTION       OB History    Gravida  4   Para  2   Term  2   Preterm      AB  2   Living  2     SAB  1   TAB  1   Ectopic      Multiple  0   Live Births  2           Family History  Problem Relation Age of Onset  . Hypertension Mother   . Migraines Mother   . Anxiety disorder Mother   . Depression Mother   . Seizures Mother   . Hypertension  Father   . Cancer Father        prostate  . Diabetes Father   . Anxiety disorder Father   . Bipolar disorder Father   . Depression Father   . OCD Father   . Heart disease Maternal Grandmother   . Heart attack Maternal Grandmother 78  . ADD / ADHD Brother   . Schizophrenia Maternal Uncle     Social History   Tobacco Use  . Smoking status: Current Every Day Smoker    Packs/day: 1.00    Types: Cigarettes  . Smokeless tobacco: Never Used  . Tobacco comment: 5 cigarettes per day (pregnant)  Vaping Use  . Vaping Use: Some days  Substance Use Topics  . Alcohol use: Not Currently    Comment: socially  . Drug use: Not Currently    Types: Cocaine    Comment: not used in a year    Home Medications Prior to Admission medications   Medication Sig Start Date End Date Taking? Authorizing Provider  acetaminophen (TYLENOL) 500 MG tablet Take 1  tablet (500 mg total) by mouth every 6 (six) hours as needed. 04/13/20   Hyman Hopes, Margaux, PA-C  amLODipine (NORVASC) 5 MG tablet TAKE 1 TABLET BY MOUTH EVERY DAY 04/16/20   Brock Bad, MD  doxycycline (VIBRAMYCIN) 100 MG capsule Take 1 capsule (100 mg total) by mouth 2 (two) times daily. 07/12/20   Jules Vidovich, Mayer Masker, MD  enalapril (VASOTEC) 10 MG tablet TAKE 1 TABLET BY MOUTH EVERY DAY 04/16/20   Brock Bad, MD  metroNIDAZOLE (FLAGYL) 500 MG tablet Take 1 tablet (500 mg total) by mouth 2 (two) times daily. 05/27/20   Lamptey, Britta Mccreedy, MD    Allergies    Ibuprofen  Review of Systems   Review of Systems  Constitutional: Negative for fever.  Gastrointestinal: Negative for abdominal pain.  Genitourinary: Positive for vaginal discharge. Negative for difficulty urinating and pelvic pain.  All other systems reviewed and are negative.   Physical Exam Updated Vital Signs BP (!) 153/99   Pulse 74   Temp 98.7 F (37.1 C) (Oral)   Resp 17   Ht 1.549 m (5\' 1" )   Wt 70.8 kg   LMP 07/04/2020   SpO2 98%   Breastfeeding No   BMI 29.48 kg/m   Physical Exam Vitals and nursing note reviewed.  Constitutional:      Appearance: She is well-developed.  HENT:     Head: Normocephalic and atraumatic.     Mouth/Throat:     Mouth: Mucous membranes are moist.  Eyes:     Pupils: Pupils are equal, round, and reactive to light.  Cardiovascular:     Rate and Rhythm: Normal rate and regular rhythm.  Pulmonary:     Effort: Pulmonary effort is normal. No respiratory distress.  Abdominal:     Palpations: Abdomen is soft.     Tenderness: There is no abdominal tenderness.  Genitourinary:    Comments: Deferred per patient Musculoskeletal:     Cervical back: Neck supple.  Skin:    General: Skin is warm and dry.  Neurological:     Mental Status: She is alert and oriented to person, place, and time.  Psychiatric:        Mood and Affect: Mood normal.     ED Results / Procedures / Treatments    Labs (all labs ordered are listed, but only abnormal results are displayed) Labs Reviewed  URINALYSIS, ROUTINE W REFLEX MICROSCOPIC  PREGNANCY, URINE  GC/CHLAMYDIA PROBE AMP ([place]) NOT AT [facility]    EKG None  Radiology No results found.  Procedures Procedures (including critical care time)  Medications Ordered in ED Medications  cefTRIAXone (ROCEPHIN) injection 500 mg (has no administration in time range)  doxycycline (VIBRA-TABS) tablet 100 mg (has no administration in time range)    ED Course  I have reviewed the triage vital signs and the nursing notes.  Pertinent labs & imaging results that were available during my care of the patient were reviewed by me and considered in my medical decision making (see chart for details).    MDM Rules/Calculators/A&P                          Patient presents with vaginal discharge.  She would like to be tested and treated for STDs.  She has no other symptoms with the exception of vaginal discharge.  No pelvic pain.  She does not believe herself to be pregnant.  Urinalysis shows no evidence of urinary tract infection or trichomonas.  Patient was given Rocephin and doxycycline.  She self swab for GC and chlamydia.  We discussed pelvic exam and patient deferred that.  Given that she is having any pelvic pain, do not feel this is warranted at this time.  She was counseled regarding safe sex practices.  Abstinence recommended for the next 10 days.  After history, exam, and medical workup I feel the patient has been appropriately medically screened and is safe for discharge home. Pertinent diagnoses were discussed with the patient. Patient was given return precautions.    Final Clinical Impression(s) / ED Diagnoses Final diagnoses:  Vaginal discharge  Concern about STD in female without diagnosis    Rx / DC Orders ED Discharge Orders         Ordered    doxycycline (VIBRAMYCIN) 100 MG capsule  2 times daily        07/12/20 0019            Keana Dueitt, Mayer Masker, MD 07/12/20 [phone]

## 2020-07-12 NOTE — Discharge Instructions (Addendum)
You were seen today with vaginal discharge.  You were tested and treated for STDs.  Abstain from sexual activity for the next 10 days.  Take medications as prescribed.

## 2020-08-16 ENCOUNTER — Ambulatory Visit: Payer: Medicaid Other | Admitting: Obstetrics

## 2021-01-10 ENCOUNTER — Ambulatory Visit: Payer: Medicaid Other | Admitting: Obstetrics

## 2021-02-02 ENCOUNTER — Ambulatory Visit: Payer: Medicaid Other | Admitting: Obstetrics

## 2021-02-06 ENCOUNTER — Telehealth: Payer: Medicaid Other | Admitting: Nurse Practitioner

## 2021-02-06 DIAGNOSIS — N760 Acute vaginitis: Secondary | ICD-10-CM

## 2021-02-06 DIAGNOSIS — M545 Low back pain, unspecified: Secondary | ICD-10-CM

## 2021-02-06 DIAGNOSIS — N390 Urinary tract infection, site not specified: Secondary | ICD-10-CM

## 2021-02-06 MED ORDER — NITROFURANTOIN MONOHYD MACRO 100 MG PO CAPS: 100.0000 mg | ORAL_CAPSULE | Freq: Two times a day (BID) | ORAL | 0 refills | Status: AC

## 2021-02-06 MED ORDER — CYCLOBENZAPRINE HCL 10 MG PO TABS: 10.0000 mg | ORAL_TABLET | Freq: Three times a day (TID) | ORAL | 0 refills | Status: AC | PRN

## 2021-02-06 MED ORDER — METRONIDAZOLE 500 MG PO TABS
500.0000 mg | ORAL_TABLET | Freq: Two times a day (BID) | ORAL | 0 refills | Status: AC
Start: 2021-02-06 — End: 2021-02-13

## 2021-02-06 MED ORDER — FLUCONAZOLE 150 MG PO TABS: 150.0000 mg | ORAL_TABLET | Freq: Once | ORAL | 0 refills | Status: AC

## 2021-02-06 NOTE — Progress Notes (Signed)
We are sorry that you are not feeling well.  Here is how we plan to help!  Based on what you have shared with me it looks like you mostly have acute back pain.  Acute back pain is defined as musculoskeletal pain that can resolve in 1-3 weeks with conservative treatment.  Because you are allergic to NSAIDs, I recommend over-the-counter Tylenol 500mg , take 1-2 tablets every 8 hours. I have prescribed Flexeril 10 mg every eight hours as needed which is a muscle relaxer  Some patients experience stomach irritation or in increased heartburn with anti-inflammatory drugs.  Please keep in mind that muscle relaxer's can cause fatigue and should not be taken while at work or driving.  Back pain is very common.  The pain often gets better over time.  The cause of back pain is usually not dangerous.  Most people can learn to manage their back pain on their own.  If you are experiencing urinary symptoms, I recommend submitting another e-visit questionnaire.  Home Care  Stay active.  Start with short walks on flat ground if you can.  Try to walk farther each day.  Do not sit, drive or stand in one place for more than 30 minutes.  Do not stay in bed.  Do not avoid exercise or work.  Activity can help your back heal faster.  Be careful when you bend or lift an object.  Bend at your knees, keep the object close to you, and do not twist.  Sleep on a firm mattress.  Lie on your side, and bend your knees.  If you lie on your back, put a pillow under your knees.  Only take medicines as told by your doctor.  Put ice on the injured area.  Put ice in a plastic bag  Place a towel between your skin and the bag  Leave the ice on for 15-20 minutes, 3-4 times a day for the first 2-3 days. 210 After that, you can switch between ice and heat packs.  Ask your doctor about back exercises or massage.  Avoid feeling anxious or stressed.  Find good ways to deal with stress, such as exercise.  Get Help Right Way  If:  Your pain does not go away with rest or medicine.  Your pain does not go away in 1 week.  You have new problems.  You do not feel well.  The pain spreads into your legs.  You cannot control when you poop (bowel movement) or pee (urinate)  You feel sick to your stomach (nauseous) or throw up (vomit)  You have belly (abdominal) pain.  You feel like you may pass out (faint).  If you develop a fever.  Make Sure you:  Understand these instructions.  Will watch your condition  Will get help right away if you are not doing well or get worse.  Your e-visit answers were reviewed by a board certified advanced clinical practitioner to complete your personal care plan.  Depending on the condition, your plan could have included both over the counter or prescription medications.  If there is a problem please reply  once you have received a response from your provider.  Your safety is important to .  If you have drug allergies check your prescription carefully.    You can use MyChart to ask questions about today's visit, request a non-urgent call back, or ask for a work or school excuse for 24 hours related to this e-Visit. If it has been greater than  24 hours you will need to follow up with your provider, or enter a new e-Visit to address those concerns.  You will get an e-mail in the next two days asking about your experience.  I hope that your e-visit has been valuable and will speed your recovery. Thank you for using e-visits.  I have spent at least 5 minutes reviewing and documenting in the patient's chart.

## 2021-02-06 NOTE — Progress Notes (Signed)
We are sorry that you are not feeling well. Here is how we plan to help! Based on what you shared with me it looks like you: May have a vaginosis due to bacteria and yeast with urinary tract infection symptoms.  Vaginosis is an inflammation of the vagina that can result in discharge, itching and pain. The cause is usually a change in the normal balance of vaginal bacteria or an infection. Vaginosis can also result from reduced estrogen levels after menopause.  The most common causes of vaginosis are:   Bacterial vaginosis which results from an overgrowth of one on several organisms that are normally present in your vagina.   Yeast infections which are caused by a naturally occurring fungus called candida.   Vaginal atrophy (atrophic vaginosis) which results from the thinning of the vagina from reduced estrogen levels after menopause.   Trichomoniasis which is caused by a parasite and is commonly transmitted by sexual intercourse.  Factors that increase your risk of developing vaginosis include: [place] Kitchen Medications, such as antibiotics and steroids . Uncontrolled diabetes . Use of hygiene products such as bubble bath, vaginal spray or vaginal deodorant . Douching . Wearing damp or tight-fitting clothing . Using an intrauterine device (IUD) for birth control . Hormonal changes, such as those associated with pregnancy, birth control pills or menopause . Sexual activity . Having a sexually transmitted infection  Your treatment plan is Metronidazole or Flagyl 500mg  twice a day for 7 days.  I have electronically sent this prescription into the pharmacy that you have chosen. I have also prescribed Fluconazole 150mg  tablet, single dose. For your urinary symptoms, I have prescribed Macrobid 100mg , twice daily for 5 days.  Be sure to take all of the medication as directed. Stop taking any medication if you develop a rash, tongue swelling or shortness of breath. Mothers who are breast feeding should  consider pumping and discarding their breast milk while on these antibiotics. However, there is no consensus that infant exposure at these doses would be harmful.  Remember that medication creams can weaken latex condoms.   HOME CARE:  Good hygiene may prevent some types of vaginosis from recurring and may relieve some symptoms:  . Avoid baths, hot tubs and whirlpool spas. Rinse soap from your outer genital area after a shower, and dry the area well to prevent irritation. Don't use scented or harsh soaps, such as those with deodorant or antibacterial action. Avoid irritants. These include scented tampons and pads. . Wipe from front to back after using the toilet. Doing so avoids spreading fecal bacteria to your vagina.  Other things that may help prevent vaginosis include:  Don't douche. Your vagina doesn't require cleansing other than normal bathing. Repetitive douching disrupts the normal organisms that reside in the vagina and can actually increase your risk of vaginal infection. Douching won't clear up a vaginal infection. . Use a latex condom. Both female and female latex condoms may help you avoid infections spread by sexual contact. . Wear cotton underwear. Also wear pantyhose with a cotton crotch. If you feel comfortable without it, skip wearing underwear to bed. Yeast thrives in [place] Kitchen Your symptoms should improve in the next day or two.  GET HELP RIGHT AWAY IF:  . You have pain in your lower abdomen ( pelvic area or over your ovaries) . You develop nausea or vomiting . You develop a fever . Your discharge changes or worsens . You have persistent pain with intercourse . You develop  shortness of breath, a rapid pulse, or you faint.  These symptoms could be signs of problems or infections that need to be evaluated by a medical provider now.  MAKE SURE YOU    Understand these instructions.  Will watch your condition.  Will get help right away if you are not  doing well or get worse.  Your e-visit answers were reviewed by a board certified advanced clinical practitioner to complete your personal care plan. Depending upon the condition, your plan could have included both over the counter or prescription medications. Please review your pharmacy choice to make sure that you have choses a pharmacy that is open for you to pick up any needed prescription, Your safety is important to [place]. If you have drug allergies check your prescription carefully.   You can use MyChart to ask questions about today's visit, request a non-urgent call back, or ask for a work or school excuse for 24 hours related to this e-Visit. If it has been greater than 24 hours you will need to follow up with your provider, or enter a new e-Visit to address those concerns. You will get a MyChart message within the next two days asking about your experience. I hope that your e-visit has been valuable and will speed your recovery.  .I have spent at least 5 minutes reviewing and documenting in the patient's chart.

## 2021-03-05 ENCOUNTER — Telehealth: Payer: Medicaid Other | Admitting: Nurse Practitioner

## 2021-03-05 DIAGNOSIS — Z202 Contact with and (suspected) exposure to infections with a predominantly sexual mode of transmission: Secondary | ICD-10-CM

## 2021-03-05 DIAGNOSIS — B373 Candidiasis of vulva and vagina: Secondary | ICD-10-CM | POA: Diagnosis not present

## 2021-03-05 DIAGNOSIS — B3731 Acute candidiasis of vulva and vagina: Secondary | ICD-10-CM

## 2021-03-06 ENCOUNTER — Encounter (INDEPENDENT_AMBULATORY_CARE_PROVIDER_SITE_OTHER): Payer: Self-pay

## 2021-03-06 MED ORDER — METRONIDAZOLE 500 MG PO TABS: 500.0000 mg | ORAL_TABLET | Freq: Two times a day (BID) | ORAL | 0 refills | Status: DC

## 2021-03-06 MED ORDER — FLUCONAZOLE 150 MG PO TABS: ORAL_TABLET | ORAL | 0 refills | Status: DC

## 2021-03-06 NOTE — Progress Notes (Signed)
We are sorry that you are not feeling well. Here is how we plan to help! Based on what you shared with me it looks like you: May have a yeast vaginosis and I am also treating you prophylactically for an STI exposure.   Vaginosis is an inflammation of the vagina that can result in discharge, itching and pain. The cause is usually a change in the normal balance of vaginal bacteria or an infection. Vaginosis can also result from reduced estrogen levels after menopause.  The most common causes of vaginosis are:   Bacterial vaginosis which results from an overgrowth of one on several organisms that are normally present in your vagina.   Yeast infections which are caused by a naturally occurring fungus called candida.   Vaginal atrophy (atrophic vaginosis) which results from the thinning of the vagina from reduced estrogen levels after menopause.   Trichomoniasis which is caused by a parasite and is commonly transmitted by sexual intercourse.  Factors that increase your risk of developing vaginosis include: Marland Kitchen Medications, such as antibiotics and steroids . Uncontrolled diabetes . Use of hygiene products such as bubble bath, vaginal spray or vaginal deodorant . Douching . Wearing damp or tight-fitting clothing . Using an intrauterine device (IUD) for birth control . Hormonal changes, such as those associated with pregnancy, birth control pills or menopause . Sexual activity . Having a sexually transmitted infection  Your treatment plan is Metronidazole or Flagyl 500mg  twice a day for 7 days.  I have electronically sent this prescription into the pharmacy that you have chosen. I am also prescribing Fluconazole 150mg , take one tablet today and take the 2nd tablet when you complete the Metronidazole. I am treating you prophylactically for trichomonas and a yeast infection. Due to the symptoms you have prescribed, it may be best to see a provider face to face to ensure your symptoms are being treated  appropriately.   Be sure to take all of the medication as directed. Stop taking any medication if you develop a rash, tongue swelling or shortness of breath. Mothers who are breast feeding should consider pumping and discarding their breast milk while on these antibiotics. However, there is no consensus that infant exposure at these doses would be harmful.  Remember that medication creams can weaken latex condoms.   HOME CARE:  Good hygiene may prevent some types of vaginosis from recurring and may relieve some symptoms:  . Avoid baths, hot tubs and whirlpool spas. Rinse soap from your outer genital area after a shower, and dry the area well to prevent irritation. Don't use scented or harsh soaps, such as those with deodorant or antibacterial action. Avoid irritants. These include scented tampons and pads. . Wipe from front to back after using the toilet. Doing so avoids spreading fecal bacteria to your vagina.  Other things that may help prevent vaginosis include:  Marland Kitchen Don't douche. Your vagina doesn't require cleansing other than normal bathing. Repetitive douching disrupts the normal organisms that reside in the vagina and can actually increase your risk of vaginal infection. Douching won't clear up a vaginal infection. . Use a latex condom. Both female and female latex condoms may help you avoid infections spread by sexual contact. . Wear cotton underwear. Also wear pantyhose with a cotton crotch. If you feel comfortable without it, skip wearing underwear to bed. Yeast thrives in Marland Kitchen Your symptoms should improve in the next day or two.  GET HELP RIGHT AWAY IF:  . You have pain  in your lower abdomen ( pelvic area or over your ovaries) . You develop nausea or vomiting . You develop a fever . Your discharge changes or worsens . You have persistent pain with intercourse . You develop shortness of breath, a rapid pulse, or you faint.  These symptoms could be signs of  problems or infections that need to be evaluated by a medical provider now.  MAKE SURE YOU    Understand these instructions.  Will watch your condition.  Will get help right away if you are not doing well or get worse.  Your e-visit answers were reviewed by a board certified advanced clinical practitioner to complete your personal care plan. Depending upon the condition, your plan could have included both over the counter or prescription medications. Please review your pharmacy choice to make sure that you have choses a pharmacy that is open for you to pick up any needed prescription, Your safety is important to [place]. If you have drug allergies check your prescription carefully.   You can use MyChart to ask questions about today's visit, request a non-urgent call back, or ask for a work or school excuse for 24 hours related to this e-Visit. If it has been greater than 24 hours you will need to follow up with your provider, or enter a new e-Visit to address those concerns. You will get a MyChart message within the next two days asking about your experience. I hope that your e-visit has been valuable and will speed your recovery.  I have spent at least 5 minutes reviewing and documenting in the patient's chart.

## 2021-03-09 ENCOUNTER — Ambulatory Visit: Payer: Medicaid Other | Admitting: Obstetrics

## 2021-03-25 ENCOUNTER — Other Ambulatory Visit (INDEPENDENT_AMBULATORY_CARE_PROVIDER_SITE_OTHER): Payer: Self-pay | Admitting: Nurse Practitioner

## 2021-03-25 DIAGNOSIS — B3731 Acute candidiasis of vulva and vagina: Secondary | ICD-10-CM

## 2021-03-25 DIAGNOSIS — Z202 Contact with and (suspected) exposure to infections with a predominantly sexual mode of transmission: Secondary | ICD-10-CM

## 2021-03-31 ENCOUNTER — Other Ambulatory Visit: Payer: Self-pay

## 2021-03-31 ENCOUNTER — Ambulatory Visit
Admission: EM | Admit: 2021-03-31 | Discharge: 2021-03-31 | Disposition: A | Discharge status: Home / Self Care | Payer: Medicaid Other | Attending: Student | Admitting: Student

## 2021-03-31 DIAGNOSIS — R1011 Right upper quadrant pain: Secondary | ICD-10-CM

## 2021-03-31 LAB — POCT URINALYSIS DIP (MANUAL ENTRY)
Bilirubin, UA: NEGATIVE
Glucose, UA: NEGATIVE mg/dL
Ketones, POC UA: NEGATIVE mg/dL
Leukocytes, UA: NEGATIVE
Nitrite, UA: NEGATIVE
Protein Ur, POC: NEGATIVE mg/dL
Spec Grav, UA: 1.02 (ref 1.010–1.025)
Urobilinogen, UA: 0.2 E.U./dL
pH, UA: 6 (ref 5.0–8.0)

## 2021-03-31 LAB — POCT URINE PREGNANCY: Preg Test, Ur: NEGATIVE

## 2021-03-31 NOTE — ED Triage Notes (Signed)
Pt c/o RUQ pain radiating to back x2 days with abdominal distention. Last NBM was yesterday.

## 2021-03-31 NOTE — Discharge Instructions (Addendum)
-  Please head straight to [facility] or [facility] ED for further evaluation and management. I'm concerned that you're having a gallbladder problem. This needs to be evaluated in the ED setting with imaging and labwork. Have a family member drive the vehicle if possible. Stop and call 911 if you experience new symptoms like dizziness, worsening of pain, shortness of breath, weakness.

## 2021-03-31 NOTE — ED Provider Notes (Signed)
[facility]    CSN: [id number] Arrival date & time: 03/31/21  0850      History   Chief Complaint Chief Complaint  Patient presents with   Abdominal Pain    HPI Olivia [name] is a 35 y.o. female presenting with RUQ abd pain x2 days.  Medical history gestational diabetes, ovarian cyst, hypertension. Intermittent RUQ pain radiating to back, getting worse. Denies triggers, states does not get worse with eating. Regular bowel movements, last one was 1 day ago, and states she's still passing gas. Denies diarrhea. Denies urinary symptoms. Denies vaginal symptoms. Currently on her period.   HPI  Past Medical History:  Diagnosis Date   Anxiety    Bipolar 1 disorder (HCC)    Chronic hypertension during pregnancy, antepartum 11/07/2017   Cocaine abuse (HCC)    Elevated hemoglobin A1c 06/25/2017   Gestational diabetes    Hypertension    Low vitamin D level 06/25/2017   Migraine    Obesity affecting pregnancy in second trimester 06/20/2017   Ovarian cyst    PROM (premature rupture of membranes) 12/05/2017   PTSD (post-traumatic stress disorder)    Rh negative state in antepartum period 06/25/2017   Supervision of high risk pregnancy, antepartum 12/03/2014    Clinic [facility] Prenatal Labs Dating LMP Blood type: O/Negative/-- (09/12 1132)  Genetic Screen 1 Screen:    AFP:     Quad:     NIPS:Mat21; normal Antibody:Negative (09/12 1132) Anatomic [place] Normal female fetus at 19 wks Rubella: 1.58 (09/12 1132) GTT A1C: prediabetes, Early:               Third trimester:  RPR: Non Reactive (12/18 1403)  Flu vaccine  08/16/17 HBsAg: Negative (09/12 1132)  TDaP vacci    Patient Active Problem List   Diagnosis Date Noted   History of cystostomy 10/16/2019   Chronic hypertension with superimposed pre-eclampsia 10/02/2019   History of cesarean section, low transverse 10/02/2019   H/O tubal ligation 10/02/2019   Diabetes in pregnancy 07/19/2017   History of cocaine abuse (HCC) 06/28/2017    Tobacco use affecting pregnancy, antepartum 06/20/2017   MDD (major depressive disorder), recurrent severe, 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/23/2016    Past Surgical History:  Procedure Laterality Date   CESAREAN SECTION N/A 12/06/2017   Procedure: CESAREAN SECTION;  Surgeon: Tilda Burrow, MD;  Location: [facility] BIRTHING SU[facility];  Service: Obstetrics;  Laterality: N/A;   CESAREAN SECTION N/A 10/02/2019   Procedure: CESAREAN SECTION;  Surgeon: Tilda Burrow, MD;  Location: [facility] LD ORS;  Service: Obstetrics;  Laterality: N/A;   TUBAL LIGATION     WISDOM TOOTH EXTRACTION      OB History     Gravida  4   Para  2   Term  2   Preterm      AB  2   Living  2      SAB  1   IAB  1   Ectopic      Multiple  0   Live Births  2            Home Medications    Prior to Admission medications   Medication Sig Start Date End Date Taking? Authorizing Provider  acetaminophen (TYLENOL) 500 MG tablet Take 1 tablet (500 mg total) by mouth every 6 (six) hours as needed. 04/13/20   Hyman Hopes, Margaux, PA-C  amLODipine (NORVASC) 5 MG tablet TAKE 1 TABLET BY MOUTH EVERY DAY 04/16/20   Brock Bad,  MD  cyclobenzaprine (FLEXERIL) 10 MG tablet Take 1 tablet (10 mg total) by mouth 3 (three) times daily as needed for muscle spasms. 02/06/21   Benay Pike, NP  enalapril (VASOTEC) 10 MG tablet TAKE 1 TABLET BY MOUTH EVERY DAY 04/16/20   Brock Bad, MD    Family History Family History  Problem Relation Age of Onset   Hypertension Mother    Migraines Mother    Anxiety disorder Mother    Depression Mother    Seizures Mother    Hypertension Father    Cancer Father        prostate   Diabetes Father    Anxiety disorder Father    Bipolar disorder Father    Depression Father    OCD Father    Heart disease Maternal Grandmother    Heart attack Maternal Grandmother 29   ADD / ADHD Brother    Schizophrenia Maternal Uncle     Social History Social History   Tobacco Use    Smoking status: Every Day    Packs/day: 1.00    Pack years: 0.00    Types: Cigarettes   Smokeless tobacco: Never   Tobacco comments:    5 cigarettes per day (pregnant)  Vaping Use   Vaping Use: Some days  Substance Use Topics   Alcohol use: Not Currently    Comment: socially   Drug use: Not Currently    Types: Cocaine    Comment: not used in a year     Allergies   Ibuprofen   Review of Systems Review of Systems  Constitutional:  Negative for appetite change, chills, diaphoresis, fever and unexpected weight change.  HENT:  Negative for congestion, ear pain, sinus pressure, sinus pain, sneezing, sore throat and trouble swallowing.   Respiratory:  Negative for cough, chest tightness and shortness of breath.   Cardiovascular:  Negative for chest pain.  Gastrointestinal:  Positive for abdominal pain. Negative for abdominal distention, anal bleeding, blood in stool, constipation, diarrhea, nausea, rectal pain and vomiting.  Genitourinary:  Negative for dysuria, flank pain, frequency and urgency.  Musculoskeletal:  Negative for back pain and myalgias.  Neurological:  Negative for dizziness, light-headedness and headaches.  All other systems reviewed and are negative.   Physical Exam Triage Vital Signs ED Triage Vitals  Enc Vitals Group     BP 03/31/21 1027 118/76     Pulse Rate 03/31/21 1027 88     Resp 03/31/21 1027 18     Temp 03/31/21 1027 98.9 F (37.2 C)     Temp Source 03/31/21 1027 Oral     SpO2 03/31/21 1027 98 %     Weight --      Height --      Head Circumference --      Peak Flow --      Pain Score 03/31/21 1029 10     Pain Loc --      Pain Edu? --      Excl. in GC? --    No data found.  Updated Vital Signs BP 118/76 (BP Location: Left Arm)   Pulse 88   Temp 98.9 F (37.2 C) (Oral)   Resp 18   LMP 03/30/2021   SpO2 98%   Visual Acuity Right Eye Distance:   Left Eye Distance:   Bilateral Distance:    Right Eye Near:   Left Eye Near:     Bilateral Near:     Physical Exam Vitals reviewed.  Constitutional:  General: She is not in acute distress.    Appearance: Normal appearance. She is not ill-appearing.  HENT:     Head: Normocephalic and atraumatic.     Mouth/Throat:     Mouth: Mucous membranes are moist.     Comments: Moist mucous membranes Eyes:     Extraocular Movements: Extraocular movements intact.     Pupils: Pupils are equal, round, and reactive to light.  Cardiovascular:     Rate and Rhythm: Normal rate and regular rhythm.     Heart sounds: Normal heart sounds.  Pulmonary:     Effort: Pulmonary effort is normal.     Breath sounds: Normal breath sounds. No wheezing, rhonchi or rales.  Abdominal:     General: Bowel sounds are normal. There is no distension.     Palpations: Abdomen is soft. There is no mass.     Tenderness: There is abdominal tenderness. There is no right CVA tenderness, left CVA tenderness, guarding or rebound. Positive signs include Murphy's sign. Negative signs include Rovsing's sign and McBurney's sign.  Skin:    General: Skin is warm.     Capillary Refill: Capillary refill takes less than 2 seconds.     Comments: Good skin turgor  Neurological:     General: No focal deficit present.     Mental Status: She is alert and oriented to person, place, and time.  Psychiatric:        Mood and Affect: Mood normal.        Behavior: Behavior normal.     UC Treatments / Results  Labs (all labs ordered are listed, but only abnormal results are displayed) Labs Reviewed  POCT URINALYSIS DIP (MANUAL ENTRY) - Abnormal; Notable for the following components:      Result Value   Blood, UA large (*)    All other components within normal limits  POCT URINE PREGNANCY    EKG   Radiology No results found.  Procedures Procedures (including critical care time)  Medications Ordered in UC Medications - No data to display  Initial Impression / Assessment and Plan / UC Course  I have  reviewed the triage vital signs and the nursing notes.  Pertinent labs & imaging results that were available during my care of the patient were reviewed by me and considered in my medical decision making (see chart for details).     This patient is a very pleasant 35 y.o. year old female presenting with suspected cholecystitis.  UA with large blood, otherwise wnl. Pt on her period. Did not send culture. Negative urine pregnancy.   Recommended that this patient head straight to the emergency department for further evaluation and management.  She verbalizes understanding and agreement and states she will head straight there.  She is hemodynamically stable for transport in personal vehicle at this time.   Final Clinical Impressions(s) / UC Diagnoses   Final diagnoses:  RUQ pain     Discharge Instructions      -Please head straight to [facility] or [facility] ED for further evaluation and management. I'm concerned that you're having a gallbladder problem. This needs to be evaluated in the ED setting with imaging and labwork. Have a family member drive the vehicle if possible. Stop and call 911 if you experience new symptoms like dizziness, worsening of pain, shortness of breath, weakness.      ED Prescriptions   None    PDMP not reviewed this encounter.   Rhys Martini, PA-C 03/31/21 1117

## 2021-04-14 ENCOUNTER — Ambulatory Visit: Payer: Medicaid Other | Admitting: Obstetrics

## 2021-07-08 ENCOUNTER — Telehealth: Payer: Medicaid Other | Admitting: Physician Assistant

## 2021-07-08 DIAGNOSIS — N760 Acute vaginitis: Secondary | ICD-10-CM | POA: Diagnosis not present

## 2021-07-08 DIAGNOSIS — B9689 Other specified bacterial agents as the cause of diseases classified elsewhere: Secondary | ICD-10-CM

## 2021-07-08 MED ORDER — METRONIDAZOLE 500 MG PO TABS: 500.0000 mg | ORAL_TABLET | Freq: Two times a day (BID) | ORAL | 0 refills | Status: AC

## 2021-07-08 NOTE — Progress Notes (Signed)

## 2021-07-09 ENCOUNTER — Encounter (HOSPITAL_COMMUNITY): Payer: Self-pay

## 2021-07-09 ENCOUNTER — Emergency Department (HOSPITAL_COMMUNITY)
Admission: EM | Admit: 2021-07-09 | Discharge: 2021-07-10 | Disposition: A | Discharge status: Home / Self Care | Payer: Medicaid Other | Attending: Emergency Medicine | Admitting: Emergency Medicine

## 2021-07-09 ENCOUNTER — Other Ambulatory Visit: Payer: Self-pay

## 2021-07-09 ENCOUNTER — Emergency Department (HOSPITAL_COMMUNITY): Payer: Medicaid Other

## 2021-07-09 DIAGNOSIS — K819 Cholecystitis, unspecified: Secondary | ICD-10-CM | POA: Diagnosis not present

## 2021-07-09 DIAGNOSIS — R7989 Other specified abnormal findings of blood chemistry: Secondary | ICD-10-CM | POA: Insufficient documentation

## 2021-07-09 DIAGNOSIS — I10 Essential (primary) hypertension: Secondary | ICD-10-CM | POA: Insufficient documentation

## 2021-07-09 DIAGNOSIS — Z79899 Other long term (current) drug therapy: Secondary | ICD-10-CM | POA: Insufficient documentation

## 2021-07-09 DIAGNOSIS — R1011 Right upper quadrant pain: Secondary | ICD-10-CM | POA: Diagnosis present

## 2021-07-09 DIAGNOSIS — Z20822 Contact with and (suspected) exposure to covid-19: Secondary | ICD-10-CM | POA: Insufficient documentation

## 2021-07-09 DIAGNOSIS — F17210 Nicotine dependence, cigarettes, uncomplicated: Secondary | ICD-10-CM | POA: Diagnosis not present

## 2021-07-09 LAB — LIPASE, BLOOD: Lipase: 34 U/L (ref 11–51)

## 2021-07-09 LAB — CBC WITH DIFFERENTIAL/PLATELET
Abs Immature Granulocytes: 0.01 10*3/uL (ref 0.00–0.07)
Basophils Absolute: 0.1 10*3/uL (ref 0.0–0.1)
Basophils Relative: 1 %
Eosinophils Absolute: 0.1 10*3/uL (ref 0.0–0.5)
Eosinophils Relative: 3 %
HCT: 37.7 % (ref 36.0–46.0)
Hemoglobin: 12.1 g/dL (ref 12.0–15.0)
Immature Granulocytes: 0 %
Lymphocytes Relative: 24 %
Lymphs Abs: 1.1 10*3/uL (ref 0.7–4.0)
MCH: 30.1 pg (ref 26.0–34.0)
MCHC: 32.1 g/dL (ref 30.0–36.0)
MCV: 93.8 fL (ref 80.0–100.0)
Monocytes Absolute: 0.4 10*3/uL (ref 0.1–1.0)
Monocytes Relative: 8 %
Neutro Abs: 2.9 10*3/uL (ref 1.7–7.7)
Neutrophils Relative %: 64 %
Platelets: 318 10*3/uL (ref 150–400)
RBC: 4.02 MIL/uL (ref 3.87–5.11)
RDW: 13.5 % (ref 11.5–15.5)
WBC: 4.5 10*3/uL (ref 4.0–10.5)
nRBC: 0 % (ref 0.0–0.2)

## 2021-07-09 LAB — COMPREHENSIVE METABOLIC PANEL
ALT: 300 U/L — ABNORMAL HIGH (ref 0–44)
AST: 157 U/L — ABNORMAL HIGH (ref 15–41)
Albumin: 3.9 g/dL (ref 3.5–5.0)
Alkaline Phosphatase: 527 U/L — ABNORMAL HIGH (ref 38–126)
Anion gap: 10 (ref 5–15)
BUN: 9 mg/dL (ref 6–20)
CO2: 25 mmol/L (ref 22–32)
Calcium: 9.5 mg/dL (ref 8.9–10.3)
Chloride: 101 mmol/L (ref 98–111)
Creatinine, Ser: 0.73 mg/dL (ref 0.44–1.00)
GFR, Estimated: >60 mL/min (ref >60)
Glucose, Bld: 98 mg/dL (ref 70–99)
Potassium: 4 mmol/L (ref 3.5–5.1)
Sodium: 136 mmol/L (ref 135–145)
Total Bilirubin: 4.9 mg/dL — ABNORMAL HIGH (ref 0.3–1.2)
Total Protein: 7.6 g/dL (ref 6.5–8.1)

## 2021-07-09 LAB — PREGNANCY, URINE: Preg Test, Ur: NEGATIVE

## 2021-07-09 NOTE — ED Provider Notes (Signed)
[facility]-EMERGENCY DEPT Provider Note   CSN: [id number] Arrival date & time: 07/09/21  1651     History Chief Complaint  Patient presents with   Flank Pain    Olivia [name] is a 35 y.o. female.  The history is provided by the patient.  Abdominal Pain Pain location:  RUQ Pain radiates to:  R flank Pain severity:  Severe Onset quality:  Gradual Duration:  2 weeks Timing:  Constant Progression:  Worsening Chronicity:  New Context: not trauma   Relieved by:  Nothing Worsened by:  Nothing Ineffective treatments:  None tried Associated symptoms: nausea and vomiting   Associated symptoms: no diarrhea and no fever   Risk factors: not pregnant       Past Medical History:  Diagnosis Date   Anxiety    Bipolar 1 disorder (HCC)    Chronic hypertension during pregnancy, antepartum 11/07/2017   Cocaine abuse (HCC)    Elevated hemoglobin A1c 06/25/2017   Gestational diabetes    Hypertension    Low vitamin D level 06/25/2017   Migraine    Obesity affecting pregnancy in second trimester 06/20/2017   Ovarian cyst    PROM (premature rupture of membranes) 12/05/2017   PTSD (post-traumatic stress disorder)    Rh negative state in antepartum period 06/25/2017   Supervision of high risk pregnancy, antepartum 12/03/2014    Clinic [facility] Prenatal Labs Dating LMP Blood type: O/Negative/-- (09/12 1132)  Genetic Screen 1 Screen:    AFP:     Quad:     NIPS:Mat21; normal Antibody:Negative (09/12 1132) Anatomic [place] Normal female fetus at 19 wks Rubella: 1.58 (09/12 1132) GTT A1C: prediabetes, Early:               Third trimester:  RPR: Non Reactive (12/18 1403)  Flu vaccine  08/16/17 HBsAg: Negative (09/12 1132)  TDaP vacci    Patient Active Problem List   Diagnosis Date Noted   History of cystostomy 10/16/2019   Chronic hypertension with superimposed pre-eclampsia 10/02/2019   History of cesarean section, low transverse 10/02/2019   H/O tubal ligation 10/02/2019    Diabetes in pregnancy 07/19/2017   History of cocaine abuse (HCC) 06/28/2017   Tobacco use affecting pregnancy, antepartum 06/20/2017   MDD (major depressive disorder), recurrent severe, 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/23/2016    Past Surgical History:  Procedure Laterality Date   CESAREAN SECTION N/A 12/06/2017   Procedure: CESAREAN SECTION;  Surgeon: Tilda Burrow, MD;  Location: [facility] BIRTHING SUITES;  Service: Obstetrics;  Laterality: N/A;   CESAREAN SECTION N/A 10/02/2019   Procedure: CESAREAN SECTION;  Surgeon: Tilda Burrow, MD;  Location: [facility] LD ORS;  Service: Obstetrics;  Laterality: N/A;   TUBAL LIGATION     WISDOM TOOTH EXTRACTION       OB History     Gravida  4   Para  2   Term  2   Preterm      AB  2   Living  2      SAB  1   IAB  1   Ectopic      Multiple  0   Live Births  2           Family History  Problem Relation Age of Onset   Hypertension Mother    Migraines Mother    Anxiety disorder Mother    Depression Mother    Seizures Mother    Hypertension Father    Cancer Father  prostate   Diabetes Father    Anxiety disorder Father    Bipolar disorder Father    Depression Father    OCD Father    Heart disease Maternal Grandmother    Heart attack Maternal Grandmother 53   ADD / ADHD Brother    Schizophrenia Maternal Uncle     Social History   Tobacco Use   Smoking status: Every Day    Packs/day: 1.00    Types: Cigarettes   Smokeless tobacco: Never   Tobacco comments:    5 cigarettes per day (pregnant)  Vaping Use   Vaping Use: Some days  Substance Use Topics   Alcohol use: Not Currently    Comment: socially   Drug use: Not Currently    Types: Cocaine    Comment: not used in a year    Home Medications Prior to Admission medications   Medication Sig Start Date End Date Taking? Authorizing Provider  acetaminophen (TYLENOL) 500 MG tablet Take 1 tablet (500 mg total) by mouth every 6 (six) hours as needed. 04/13/20    Hyman Hopes, Margaux, PA-C  amLODipine (NORVASC) 5 MG tablet TAKE 1 TABLET BY MOUTH EVERY DAY 04/16/20   Brock Bad, MD  cyclobenzaprine (FLEXERIL) 10 MG tablet Take 1 tablet (10 mg total) by mouth 3 (three) times daily as needed for muscle spasms. 02/06/21   Benay Pike, NP  enalapril (VASOTEC) 10 MG tablet TAKE 1 TABLET BY MOUTH EVERY DAY 04/16/20   Brock Bad, MD  metroNIDAZOLE (FLAGYL) 500 MG tablet Take 1 tablet (500 mg total) by mouth 2 (two) times daily for 7 days. 07/08/21 07/15/21  Margaretann Loveless, PA-C    Allergies    Ibuprofen  Review of Systems   Review of Systems  Constitutional:  Negative for fever.  HENT:  Negative for facial swelling.   Eyes:  Negative for redness.  Respiratory:  Negative for wheezing and stridor.   Cardiovascular:  Negative for leg swelling.  Gastrointestinal:  Positive for abdominal pain, nausea and vomiting. Negative for diarrhea.  Genitourinary:  Positive for flank pain.  Musculoskeletal:  Negative for neck stiffness.  Skin:  Negative for rash.  Neurological:  Negative for facial asymmetry.  Psychiatric/Behavioral:  Negative for agitation.    Physical Exam Updated Vital Signs BP (!) 165/106 (BP Location: Left Arm)   Pulse 70   Temp 98.6 F (37 C) (Oral)   Resp 16   SpO2 100%   Physical Exam Vitals and nursing note reviewed.  Constitutional:      General: She is not in acute distress.    Appearance: Normal appearance.  HENT:     Head: Normocephalic and atraumatic.     Nose: Nose normal.  Eyes:     Conjunctiva/sclera: Conjunctivae normal.     Pupils: Pupils are equal, round, and reactive to light.  Cardiovascular:     Rate and Rhythm: Normal rate and regular rhythm.     Pulses: Normal pulses.     Heart sounds: Normal heart sounds.  Pulmonary:     Effort: Pulmonary effort is normal.     Breath sounds: Normal breath sounds.  Abdominal:     General: Abdomen is flat. Bowel sounds are normal.     Palpations: Abdomen  is soft.     Tenderness: There is abdominal tenderness. There is no guarding or rebound.  Musculoskeletal:        General: Normal range of motion.     Cervical back: Normal range of motion  and neck supple.  Skin:    General: Skin is warm and dry.     Capillary Refill: Capillary refill takes less than 2 seconds.  Neurological:     General: No focal deficit present.     Mental Status: She is alert and oriented to person, place, and time.  Psychiatric:        Mood and Affect: Mood normal.        Behavior: Behavior normal.    ED Results / Procedures / Treatments   Labs (all labs ordered are listed, but only abnormal results are displayed) Results for orders placed or performed during the hospital encounter of 07/09/21  CBC with Differential  Result Value Ref Range   WBC 4.5 4.0 - 10.5 K/uL   RBC 4.02 3.87 - 5.11 MIL/uL   Hemoglobin 12.1 12.0 - 15.0 g/dL   HCT 52.8 41.3 - 24.4 %   MCV 93.8 80.0 - 100.0 fL   MCH 30.1 26.0 - 34.0 pg   MCHC 32.1 30.0 - 36.0 g/dL   RDW 01.0 27.2 - 53.6 %   Platelets 318 150 - 400 K/uL   nRBC 0.0 0.0 - 0.2 %   Neutrophils Relative % 64 %   Neutro Abs 2.9 1.7 - 7.7 K/uL   Lymphocytes Relative 24 %   Lymphs Abs 1.1 0.7 - 4.0 K/uL   Monocytes Relative 8 %   Monocytes Absolute 0.4 0.1 - 1.0 K/uL   Eosinophils Relative 3 %   Eosinophils Absolute 0.1 0.0 - 0.5 K/uL   Basophils Relative 1 %   Basophils Absolute 0.1 0.0 - 0.1 K/uL   Immature Granulocytes 0 %   Abs Immature Granulocytes 0.01 0.00 - 0.07 K/uL  Comprehensive metabolic panel  Result Value Ref Range   Sodium 136 135 - 145 mmol/L   Potassium 4.0 3.5 - 5.1 mmol/L   Chloride 101 98 - 111 mmol/L   CO2 25 22 - 32 mmol/L   Glucose, Bld 98 70 - 99 mg/dL   BUN 9 6 - 20 mg/dL   Creatinine, Ser 6.44 0.44 - 1.00 mg/dL   Calcium 9.5 8.9 - 03.4 mg/dL   Total Protein 7.6 6.5 - 8.1 g/dL   Albumin 3.9 3.5 - 5.0 g/dL   AST 742 (H) 15 - 41 U/L   ALT 300 (H) 0 - 44 U/L   Alkaline Phosphatase 527 (H)  38 - 126 U/L   Total Bilirubin 4.9 (H) 0.3 - 1.2 mg/dL   GFR, Estimated >59 >56 mL/min   Anion gap 10 5 - 15  Lipase, blood  Result Value Ref Range   Lipase 34 11 - 51 U/L   US Abdomen Limited RUQ (LIVER/GB)  Result Date: 07/09/2021 CLINICAL DATA:  Elevated liver function tests. EXAM: ULTRASOUND ABDOMEN LIMITED RIGHT UPPER QUADRANT COMPARISON:  None. FINDINGS: Gallbladder: Numerous shadowing echogenic gallstones are seen throughout the gallbladder lumen. The gallbladder wall measures approximately 3.9 mm in thickness. A trace amount of pericholecystic fluid is noted. No sonographic Murphy sign noted by sonographer. Common bile duct: Diameter: 1.09 cm Liver: No focal lesion identified. Mild diffusely increased echogenicity of the liver parenchyma is seen portal vein is patent on color Doppler imaging with normal direction of blood flow towards the liver. Other: None. IMPRESSION: Cholelithiasis with mild gallbladder wall thickening and mild pericholecystic fluid. Further evaluation with a nuclear medicine hepatobiliary scan is recommended, as acute cholecystitis can not be excluded. Electronically Signed   By: Aram Candela M.D.   On:  07/09/2021 21:06     Radiology US Abdomen Limited RUQ (LIVER/GB)  Result Date: 07/09/2021 CLINICAL DATA:  Elevated liver function tests. EXAM: ULTRASOUND ABDOMEN LIMITED RIGHT UPPER QUADRANT COMPARISON:  None. FINDINGS: Gallbladder: Numerous shadowing echogenic gallstones are seen throughout the gallbladder lumen. The gallbladder wall measures approximately 3.9 mm in thickness. A trace amount of pericholecystic fluid is noted. No sonographic Murphy sign noted by sonographer. Common bile duct: Diameter: 1.09 cm Liver: No focal lesion identified. Mild diffusely increased echogenicity of the liver parenchyma is seen portal vein is patent on color Doppler imaging with normal direction of blood flow towards the liver. Other: None. IMPRESSION: Cholelithiasis with mild  gallbladder wall thickening and mild pericholecystic fluid. Further evaluation with a nuclear medicine hepatobiliary scan is recommended, as acute cholecystitis can not be excluded. Electronically Signed   By: Aram Candela M.D.   On: 07/09/2021 21:06    Procedures Procedures   Medications Ordered in ED Medications - No data to display  ED Course  I have reviewed the triage vital signs and the nursing notes.  Pertinent labs & imaging results that were available during my care of the patient were reviewed by me and considered in my medical decision making (see chart for details).   Case d/w Dr. Dwain Sarna, admit to medicine with GI consult and [facility] will consult for surgery Final Clinical Impression(s) / ED Diagnoses Final diagnoses:  Elevated LFTs  Cholecystitis   Patient is AO4 and has decision making capacity to refuse care. She states she cannot stay, she understands the risk of signing out against medical advice.  She is welcome to return at any time.  Rx / DC Orders ED Discharge Orders     None        Levonte Molina, MD 07/10/21 0000

## 2021-07-09 NOTE — ED Triage Notes (Signed)
Pt arrived via POV, c/o left sided flank pain, urinary frequency. States concerned for STD/ and UTI.

## 2021-07-09 NOTE — ED Provider Notes (Signed)
Emergency Medicine Provider Triage Evaluation Note  Olivia [name] , a 35 y.o. female  was evaluated in triage.  Pt complains of dark urine, vaginal discharge, right-sided flank and abdominal pain.  Has been going on for about a week.  No fevers.  No vomiting.  No pain on left side.  Sexually active with a new partner and did not use condoms.  Had tubes tied.  Recently finished.,  Was longer than normal but otherwise at the right time  Review of Systems  Positive: R flank pain, R abd pain, dark urine, vag discharge Negative: fever  Physical Exam  BP (!) 147/100 (BP Location: Left Arm)   Pulse 69   Temp 98.3 F (36.8 C) (Oral)   Resp 18   SpO2 98%  Gen:   Awake, no distress   Resp:  Normal effort  MSK:   Moves extremities without difficulty  Other:  Mild ttp of the R side abd. No cva tenderness  Medical Decision Making  Medically screening exam initiated at 5:50 PM.  Appropriate orders placed.  ABRAR KOONE was informed that the remainder of the evaluation will be completed by another provider, this initial triage assessment does not replace that evaluation, and the importance of remaining in the ED until their evaluation is complete.  Labs, UA   Alveria Apley, PA-C 07/09/21 1751    Ernie Avena, MD 07/09/21 [phone]

## 2021-07-10 LAB — URINALYSIS, ROUTINE W REFLEX MICROSCOPIC
Bacteria, UA: NONE SEEN
Glucose, UA: NEGATIVE mg/dL
Hgb urine dipstick: NEGATIVE
Ketones, ur: 20 mg/dL — AB
Nitrite: NEGATIVE
Protein, ur: 30 mg/dL — AB
Specific Gravity, Urine: 1.029 (ref 1.005–1.030)
pH: 5 (ref 5.0–8.0)

## 2021-07-10 LAB — RESP PANEL BY RT-PCR (FLU A&B, COVID) ARPGX2
Influenza A by PCR: NEGATIVE
Influenza B by PCR: NEGATIVE
SARS Coronavirus 2 by RT PCR: NEGATIVE

## 2021-07-12 ENCOUNTER — Emergency Department (HOSPITAL_COMMUNITY)
Admission: EM | Admit: 2021-07-12 | Discharge: 2021-07-13 | Disposition: A | Discharge status: Home / Self Care | Payer: Medicaid Other | Attending: Emergency Medicine | Admitting: Emergency Medicine

## 2021-07-12 ENCOUNTER — Encounter (HOSPITAL_COMMUNITY): Payer: Self-pay

## 2021-07-12 ENCOUNTER — Other Ambulatory Visit: Payer: Self-pay

## 2021-07-12 DIAGNOSIS — I10 Essential (primary) hypertension: Secondary | ICD-10-CM | POA: Diagnosis not present

## 2021-07-12 DIAGNOSIS — F17210 Nicotine dependence, cigarettes, uncomplicated: Secondary | ICD-10-CM | POA: Diagnosis not present

## 2021-07-12 DIAGNOSIS — K8020 Calculus of gallbladder without cholecystitis without obstruction: Secondary | ICD-10-CM | POA: Insufficient documentation

## 2021-07-12 DIAGNOSIS — Z79899 Other long term (current) drug therapy: Secondary | ICD-10-CM | POA: Diagnosis not present

## 2021-07-12 DIAGNOSIS — R109 Unspecified abdominal pain: Secondary | ICD-10-CM | POA: Diagnosis present

## 2021-07-12 NOTE — ED Triage Notes (Signed)
Pt complains of gallbladder pain. Pt states that she was seen recently for her gallbladder and left AMA because of small children at home. Pt states that she is back to have it fixed. Pt also complains of vaginal discharge after having intercourse with her ex fiance.

## 2021-07-13 ENCOUNTER — Emergency Department (HOSPITAL_COMMUNITY): Payer: Medicaid Other

## 2021-07-13 LAB — URINALYSIS, ROUTINE W REFLEX MICROSCOPIC
Bacteria, UA: NONE SEEN
Bilirubin Urine: NEGATIVE
Glucose, UA: NEGATIVE mg/dL
Hgb urine dipstick: NEGATIVE
Ketones, ur: NEGATIVE mg/dL
Nitrite: NEGATIVE
Protein, ur: 30 mg/dL — AB
Specific Gravity, Urine: 1.034 — ABNORMAL HIGH (ref 1.005–1.030)
pH: 5 (ref 5.0–8.0)

## 2021-07-13 LAB — CBC WITH DIFFERENTIAL/PLATELET
Abs Immature Granulocytes: 0.01 10*3/uL (ref 0.00–0.07)
Basophils Absolute: 0 10*3/uL (ref 0.0–0.1)
Basophils Relative: 1 %
Eosinophils Absolute: 0.2 10*3/uL (ref 0.0–0.5)
Eosinophils Relative: 4 %
HCT: 37.6 % (ref 36.0–46.0)
Hemoglobin: 11.8 g/dL — ABNORMAL LOW (ref 12.0–15.0)
Immature Granulocytes: 0 %
Lymphocytes Relative: 41 %
Lymphs Abs: 2.1 10*3/uL (ref 0.7–4.0)
MCH: 30.3 pg (ref 26.0–34.0)
MCHC: 31.4 g/dL (ref 30.0–36.0)
MCV: 96.7 fL (ref 80.0–100.0)
Monocytes Absolute: 0.3 10*3/uL (ref 0.1–1.0)
Monocytes Relative: 6 %
Neutro Abs: 2.5 10*3/uL (ref 1.7–7.7)
Neutrophils Relative %: 48 %
Platelets: 337 10*3/uL (ref 150–400)
RBC: 3.89 MIL/uL (ref 3.87–5.11)
RDW: 13.5 % (ref 11.5–15.5)
WBC: 5.2 10*3/uL (ref 4.0–10.5)
nRBC: 0 % (ref 0.0–0.2)

## 2021-07-13 LAB — WET PREP, GENITAL
Clue Cells Wet Prep HPF POC: NONE SEEN
Sperm: NONE SEEN
Trich, Wet Prep: NONE SEEN
Yeast Wet Prep HPF POC: NONE SEEN

## 2021-07-13 LAB — COMPREHENSIVE METABOLIC PANEL
ALT: 143 U/L — ABNORMAL HIGH (ref 0–44)
AST: 37 U/L (ref 15–41)
Albumin: 4.1 g/dL (ref 3.5–5.0)
Alkaline Phosphatase: 354 U/L — ABNORMAL HIGH (ref 38–126)
Anion gap: 8 (ref 5–15)
BUN: 11 mg/dL (ref 6–20)
CO2: 32 mmol/L (ref 22–32)
Calcium: 9.8 mg/dL (ref 8.9–10.3)
Chloride: 102 mmol/L (ref 98–111)
Creatinine, Ser: 0.67 mg/dL (ref 0.44–1.00)
GFR, Estimated: >60 mL/min (ref >60)
Glucose, Bld: 118 mg/dL — ABNORMAL HIGH (ref 70–99)
Potassium: 3.8 mmol/L (ref 3.5–5.1)
Sodium: 142 mmol/L (ref 135–145)
Total Bilirubin: 1.4 mg/dL — ABNORMAL HIGH (ref 0.3–1.2)
Total Protein: 7.9 g/dL (ref 6.5–8.1)

## 2021-07-13 LAB — PREGNANCY, URINE: Preg Test, Ur: NEGATIVE

## 2021-07-13 LAB — LIPASE, BLOOD: Lipase: 27 U/L (ref 11–51)

## 2021-07-13 MED ORDER — CEPHALEXIN 500 MG PO CAPS: 500.0000 mg | ORAL_CAPSULE | Freq: Two times a day (BID) | ORAL | 0 refills | Status: DC

## 2021-07-13 NOTE — Discharge Instructions (Addendum)
The ultrasound is significant for gallstones within your gallbladder.  Your blood work is improved from your most recent visit.  Given that your symptoms are also improved, I feel that he can be safely discharged.  Please follow-up with the general surgery practice listed.  If your symptoms return or worsen, please return to the emergency department.

## 2021-07-13 NOTE — ED Provider Notes (Signed)
[facility]-EMERGENCY DEPT Provider Note   CSN: [id number] Arrival date & time: 07/12/21  1923     History Chief Complaint  Patient presents with   Abdominal Pain   Vaginal Discharge    Olivia [name] is a 35 y.o. female.  Patient presents to the emergency department with a chief complaint of cholecystitis.  She states that she was seen a couple of days ago and was diagnosed with cholecystitis.  She was advised that she would need to stay for probable surgery, but left AGAINST MEDICAL ADVICE because she has small children at home.  She states that since then, she has arranged for her children to be watched.  She states that her symptoms have improved significantly over the past couple of days.  She is not having any pain and is eating and drinking normally.  She would also like to be tested for STDs.  The history is provided by the patient. No language interpreter was used.      Past Medical History:  Diagnosis Date   Anxiety    Bipolar 1 disorder (HCC)    Chronic hypertension during pregnancy, antepartum 11/07/2017   Cocaine abuse (HCC)    Elevated hemoglobin A1c 06/25/2017   Gestational diabetes    Hypertension    Low vitamin D level 06/25/2017   Migraine    Obesity affecting pregnancy in second trimester 06/20/2017   Ovarian cyst    PROM (premature rupture of membranes) 12/05/2017   PTSD (post-traumatic stress disorder)    Rh negative state in antepartum period 06/25/2017   Supervision of high risk pregnancy, antepartum 12/03/2014    Clinic [facility] Prenatal Labs Dating LMP Blood type: O/Negative/-- (09/12 1132)  Genetic Screen 1 Screen:    AFP:     Quad:     NIPS:Mat21; normal Antibody:Negative (09/12 1132) Anatomic [place] Normal female fetus at 19 wks Rubella: 1.58 (09/12 1132) GTT A1C: prediabetes, Early:               Third trimester:  RPR: Non Reactive (12/18 1403)  Flu vaccine  08/16/17 HBsAg: Negative (09/12 1132)  TDaP vacci    Patient Active Problem List    Diagnosis Date Noted   History of cystostomy 10/16/2019   Chronic hypertension with superimposed pre-eclampsia 10/02/2019   History of cesarean section, low transverse 10/02/2019   H/O tubal ligation 10/02/2019   Diabetes in pregnancy 07/19/2017   History of cocaine abuse (HCC) 06/28/2017   Tobacco use affecting pregnancy, antepartum 06/20/2017   MDD (major depressive disorder), recurrent severe, 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/23/2016    Past Surgical History:  Procedure Laterality Date   CESAREAN SECTION N/A 12/06/2017   Procedure: CESAREAN SECTION;  Surgeon: Tilda Burrow, MD;  Location: [facility] BIRTHING SUITES;  Service: Obstetrics;  Laterality: N/A;   CESAREAN SECTION N/A 10/02/2019   Procedure: CESAREAN SECTION;  Surgeon: Tilda Burrow, MD;  Location: [facility] LD ORS;  Service: Obstetrics;  Laterality: N/A;   TUBAL LIGATION     WISDOM TOOTH EXTRACTION       OB History     Gravida  4   Para  2   Term  2   Preterm      AB  2   Living  2      SAB  1   IAB  1   Ectopic      Multiple  0   Live Births  2           Family  History  Problem Relation Age of Onset   Hypertension Mother    Migraines Mother    Anxiety disorder Mother    Depression Mother    Seizures Mother    Hypertension Father    Cancer Father        prostate   Diabetes Father    Anxiety disorder Father    Bipolar disorder Father    Depression Father    OCD Father    Heart disease Maternal Grandmother    Heart attack Maternal Grandmother 29   ADD / ADHD Brother    Schizophrenia Maternal Uncle     Social History   Tobacco Use   Smoking status: Every Day    Packs/day: 1.00    Types: Cigarettes   Smokeless tobacco: Never   Tobacco comments:    5 cigarettes per day (pregnant)  Vaping Use   Vaping Use: Some days  Substance Use Topics   Alcohol use: Not Currently    Comment: socially   Drug use: Not Currently    Types: Cocaine    Comment: not used in a year    Home  Medications Prior to Admission medications   Medication Sig Start Date End Date Taking? Authorizing Provider  acetaminophen (TYLENOL) 500 MG tablet Take 1 tablet (500 mg total) by mouth every 6 (six) hours as needed. 04/13/20   Hyman Hopes, Margaux, PA-C  amLODipine (NORVASC) 5 MG tablet TAKE 1 TABLET BY MOUTH EVERY DAY 04/16/20   Brock Bad, MD  cyclobenzaprine (FLEXERIL) 10 MG tablet Take 1 tablet (10 mg total) by mouth 3 (three) times daily as needed for muscle spasms. 02/06/21   Benay Pike, NP  enalapril (VASOTEC) 10 MG tablet TAKE 1 TABLET BY MOUTH EVERY DAY 04/16/20   Brock Bad, MD  metroNIDAZOLE (FLAGYL) 500 MG tablet Take 1 tablet (500 mg total) by mouth 2 (two) times daily for 7 days. 07/08/21 07/15/21  Margaretann Loveless, PA-C    Allergies    Ibuprofen  Review of Systems   Review of Systems  All other systems reviewed and are negative.  Physical Exam Updated Vital Signs BP (!) 129/111   Pulse 77   Temp 98.4 F (36.9 C) (Oral)   Resp 16   SpO2 99%   Physical Exam Vitals and nursing note reviewed.  Constitutional:      General: She is not in acute distress.    Appearance: She is well-developed.  HENT:     Head: Normocephalic and atraumatic.  Eyes:     Conjunctiva/sclera: Conjunctivae normal.  Cardiovascular:     Rate and Rhythm: Normal rate and regular rhythm.     Heart sounds: No murmur heard. Pulmonary:     Effort: Pulmonary effort is normal. No respiratory distress.     Breath sounds: Normal breath sounds.  Abdominal:     Palpations: Abdomen is soft.     Tenderness: There is no abdominal tenderness.     Comments: No focal abdominal tenderness, specifically no Murphy sign or right upper quadrant tenderness  Musculoskeletal:     Cervical back: Neck supple.  Skin:    General: Skin is warm and dry.  Neurological:     Mental Status: She is alert and oriented to person, place, and time.  Psychiatric:        Mood and Affect: Mood normal.         Behavior: Behavior normal.    ED Results / Procedures / Treatments   Labs (all labs ordered  are listed, but only abnormal results are displayed) Labs Reviewed  WET PREP, GENITAL  CBC WITH DIFFERENTIAL/PLATELET  COMPREHENSIVE METABOLIC PANEL  LIPASE, BLOOD  URINALYSIS, ROUTINE W REFLEX MICROSCOPIC  PREGNANCY, URINE  GC/CHLAMYDIA PROBE AMP ([place]) NOT AT [facility]    EKG None  Radiology No results found.  Procedures Procedures   Medications Ordered in ED Medications - No data to display  ED Course  I have reviewed the triage vital signs and the nursing notes.  Pertinent labs & imaging results that were available during my care of the patient were reviewed by me and considered in my medical decision making (see chart for details).    MDM Rules/Calculators/A&P                           Patient here for reassessment.  She was seen recently and was told that she would need to have surgery for cholecystitis.  Looks like she had also been recommended to have a HIDA scan.  Patient left AGAINST MEDICAL ADVICE.  Since then, patient has been feeling much better.  She is eating and drinking normally.  She is not having any pain.  We will recheck labs and ultrasound.  Anticipate the patient can be discharged home with outpatient surgical follow-up.  Gallbladder is nondistended.  The LFTs are improving.  Patient has no abdominal pain or tenderness now.  Feel that outpatient follow-up is appropriate.  Patient will return for new or worsening symptoms.  She is agreeable with this plan. Final Clinical Impression(s) / ED Diagnoses Final diagnoses:  Calculus of gallbladder without cholecystitis without obstruction    Rx / DC Orders ED Discharge Orders     None        Roxy Horseman, PA-C 07/13/21 0143    Sabas Sous, MD 07/13/21 [phone]

## 2021-07-14 LAB — GC/CHLAMYDIA PROBE AMP (~~LOC~~) NOT AT ARMC
Chlamydia: NEGATIVE
Comment: NEGATIVE
Comment: NORMAL
Neisseria Gonorrhea: NEGATIVE

## 2021-10-13 ENCOUNTER — Telehealth: Payer: Medicaid Other | Admitting: Physician Assistant

## 2021-10-13 DIAGNOSIS — N898 Other specified noninflammatory disorders of vagina: Secondary | ICD-10-CM

## 2021-10-13 DIAGNOSIS — Z202 Contact with and (suspected) exposure to infections with a predominantly sexual mode of transmission: Secondary | ICD-10-CM

## 2021-10-13 NOTE — Progress Notes (Signed)
Based on what you shared with me, I feel your condition warrants further evaluation and I recommend that you be seen in a face to face visit.  Giving current symptoms and possible exposure to STD, you need to be evaluated in person for examination and testing so you can make sure you receive the proper treatment.    NOTE: There will be NO CHARGE for this eVisit   If you are having a true medical emergency please call 911.      For an urgent face to face visit, [facility] has six urgent care centers for your convenience:     [facility] Urgent Care Center at [facility] Directions [phone] [address] [place], [place] [address]    [facility] Health Urgent Care Center [facility]) Get Driving Directions [phone] [address] [place], [place] [address]  [facility] Urgent Care Center [facility]) Get Driving Directions [phone] [address] [place],  [place]  [address]  [facility] at [facility] Get Driving Directions [phone] [address] [place], [place] [address]   [facility] Health Urgent Care at [facility] Get Driving Directions  [phone] [address] [place], [place] [address]   [facility] Health Urgent Care at [facility] Directions [phone] [address] [place], [place] [address]  Your MyChart E-visit questionnaire answers were reviewed by a board certified advanced clinical practitioner to complete your personal care plan based on your specific symptoms.  Thank you for using e-Visits.

## 2021-10-25 ENCOUNTER — Telehealth: Payer: Medicaid Other | Admitting: Physician Assistant

## 2021-10-25 DIAGNOSIS — R3989 Other symptoms and signs involving the genitourinary system: Secondary | ICD-10-CM

## 2021-10-25 DIAGNOSIS — B3731 Acute candidiasis of vulva and vagina: Secondary | ICD-10-CM

## 2021-10-25 MED ORDER — CEPHALEXIN 500 MG PO CAPS: 500.0000 mg | ORAL_CAPSULE | Freq: Two times a day (BID) | ORAL | 0 refills | Status: DC

## 2021-10-25 MED ORDER — FLUCONAZOLE 150 MG PO TABS: 150.0000 mg | ORAL_TABLET | Freq: Once | ORAL | 0 refills | Status: AC

## 2021-10-25 NOTE — Progress Notes (Signed)
I have spent 5 minutes in review of e-visit questionnaire, review and updating patient chart, medical decision making and response to patient.   Regine Christian Cody Dreshon Proffit, PA-C    

## 2021-10-25 NOTE — Progress Notes (Signed)

## 2021-10-25 NOTE — Progress Notes (Signed)

## 2022-02-06 ENCOUNTER — Encounter: Payer: Medicaid Other | Admitting: Nurse Practitioner

## 2022-02-06 ENCOUNTER — Other Ambulatory Visit: Payer: Self-pay | Admitting: Physician Assistant

## 2022-02-06 ENCOUNTER — Telehealth: Payer: Medicaid Other | Admitting: Nurse Practitioner

## 2022-02-06 ENCOUNTER — Other Ambulatory Visit (INDEPENDENT_AMBULATORY_CARE_PROVIDER_SITE_OTHER): Payer: Self-pay | Admitting: Physician Assistant

## 2022-02-06 DIAGNOSIS — M545 Low back pain, unspecified: Secondary | ICD-10-CM

## 2022-02-06 DIAGNOSIS — R3989 Other symptoms and signs involving the genitourinary system: Secondary | ICD-10-CM

## 2022-02-06 NOTE — Progress Notes (Signed)
Based on what you shared with me, I feel your condition warrants further evaluation and I recommend that you be seen in a face to face visit. ? ?Because of your urinary symptoms and back pain we feel it is best you are evaluated in person today. ?  ?NOTE: There will be NO CHARGE for this eVisit ?  ?If you are having a true medical emergency please call 911.   ?  ? For an urgent face to face visit, [place] has six urgent care centers for your convenience:  ?  ? [facility] at [facility] ?Get Driving Directions ?[phone] ?[phone] ?[place], [place] [address] ?  ? [facility]) ?Get Driving Directions ?[phone] ?[address] ?[place], [place] [address] ? ?[facility]) ?Get Driving Directions ?[phone] ?[address] ?[place],  [place]  [address] ? ?[facility] ?Get Driving Directions ?[phone] ?[address] ?[place], [place] [address] ?  ?[facility] [place] ?Get Driving Directions  ?[phone] ?[address] ?[address] ?[place], [place] [address] ?  ? Urgent Care at [facility] ?Get Driving Directions ?[phone] ?[address] ?[place], [place] [address] ? ?Your MyChart E-visit questionnaire answers were reviewed by a board certified advanced clinical practitioner to complete your personal care plan based on your specific symptoms.  Thank you for using e-Visits. ?

## 2022-02-07 NOTE — Progress Notes (Signed)
Duplicate request- referred to UC/ED ?

## 2022-03-06 ENCOUNTER — Telehealth: Payer: Medicaid Other | Admitting: Physician Assistant

## 2022-03-06 DIAGNOSIS — N898 Other specified noninflammatory disorders of vagina: Secondary | ICD-10-CM | POA: Diagnosis not present

## 2022-03-06 DIAGNOSIS — R350 Frequency of micturition: Secondary | ICD-10-CM

## 2022-03-06 MED ORDER — CEPHALEXIN 500 MG PO CAPS: 500.0000 mg | ORAL_CAPSULE | Freq: Two times a day (BID) | ORAL | 0 refills | Status: AC

## 2022-03-06 MED ORDER — METRONIDAZOLE 500 MG PO TABS: 500.0000 mg | ORAL_TABLET | Freq: Two times a day (BID) | ORAL | 0 refills | Status: AC

## 2022-03-06 NOTE — Progress Notes (Signed)

## 2022-03-06 NOTE — Progress Notes (Signed)

## 2022-10-11 ENCOUNTER — Telehealth: Payer: Medicaid Other | Admitting: Physician Assistant

## 2022-10-11 DIAGNOSIS — R3989 Other symptoms and signs involving the genitourinary system: Secondary | ICD-10-CM

## 2022-10-11 MED ORDER — CEPHALEXIN 500 MG PO CAPS: 500.0000 mg | ORAL_CAPSULE | Freq: Two times a day (BID) | ORAL | 0 refills | Status: AC

## 2022-10-11 NOTE — Progress Notes (Signed)

## 2023-01-17 ENCOUNTER — Ambulatory Visit: Payer: Medicaid Other | Admitting: Obstetrics
